# Patient Record
Sex: Female | Born: 1935 | Race: White | Hispanic: No | Smoking: Former smoker
Health system: Southern US, Community
[De-identification: ages and names within clinical notes are randomized; demographics above are authoritative.]

## PROBLEM LIST (undated history)

## (undated) DIAGNOSIS — J3089 Other allergic rhinitis: Secondary | ICD-10-CM

## (undated) DIAGNOSIS — G309 Alzheimer's disease, unspecified: Secondary | ICD-10-CM

## (undated) DIAGNOSIS — K5909 Other constipation: Secondary | ICD-10-CM

## (undated) DIAGNOSIS — R630 Anorexia: Secondary | ICD-10-CM

## (undated) DIAGNOSIS — M1990 Unspecified osteoarthritis, unspecified site: Secondary | ICD-10-CM

## (undated) DIAGNOSIS — F329 Major depressive disorder, single episode, unspecified: Secondary | ICD-10-CM

## (undated) DIAGNOSIS — F0280 Dementia in other diseases classified elsewhere without behavioral disturbance: Secondary | ICD-10-CM

## (undated) DIAGNOSIS — K219 Gastro-esophageal reflux disease without esophagitis: Secondary | ICD-10-CM

## (undated) DIAGNOSIS — R011 Cardiac murmur, unspecified: Secondary | ICD-10-CM

## (undated) DIAGNOSIS — I10 Essential (primary) hypertension: Secondary | ICD-10-CM

## (undated) DIAGNOSIS — M19019 Primary osteoarthritis, unspecified shoulder: Secondary | ICD-10-CM

## (undated) DIAGNOSIS — E785 Hyperlipidemia, unspecified: Secondary | ICD-10-CM

## (undated) DIAGNOSIS — F32A Depression, unspecified: Secondary | ICD-10-CM

## (undated) DIAGNOSIS — F0390 Unspecified dementia without behavioral disturbance: Secondary | ICD-10-CM

## (undated) HISTORY — DX: Hyperlipidemia, unspecified: E78.5

## (undated) HISTORY — DX: Other allergic rhinitis: J30.89

## (undated) HISTORY — DX: Anorexia: R63.0

## (undated) HISTORY — PX: TONSILLECTOMY: SUR1361

## (undated) HISTORY — DX: Primary osteoarthritis, unspecified shoulder: M19.019

## (undated) HISTORY — PX: ABDOMINAL HYSTERECTOMY: SHX81

## (undated) HISTORY — DX: Other constipation: K59.09

## (undated) HISTORY — PX: BACK SURGERY: SHX140

## (undated) HISTORY — DX: Dementia in other diseases classified elsewhere, unspecified severity, without behavioral disturbance, psychotic disturbance, mood disturbance, and anxiety: F02.80

## (undated) HISTORY — DX: Cardiac murmur, unspecified: R01.1

## (undated) HISTORY — DX: Gastro-esophageal reflux disease without esophagitis: K21.9

## (undated) HISTORY — DX: Unspecified dementia, unspecified severity, without behavioral disturbance, psychotic disturbance, mood disturbance, and anxiety: F03.90

## (undated) HISTORY — DX: Alzheimer's disease, unspecified: G30.9

---

## 2002-05-05 ENCOUNTER — Encounter: Payer: Self-pay | Admitting: Emergency Medicine

## 2002-05-05 ENCOUNTER — Emergency Department (HOSPITAL_COMMUNITY): Admission: EM | Admit: 2002-05-05 | Discharge: 2002-05-05 | Payer: Self-pay | Admitting: Emergency Medicine

## 2003-12-18 ENCOUNTER — Observation Stay (HOSPITAL_COMMUNITY): Admission: EM | Admit: 2003-12-18 | Discharge: 2003-12-19 | Payer: Self-pay | Admitting: Emergency Medicine

## 2004-11-20 ENCOUNTER — Other Ambulatory Visit: Admission: RE | Admit: 2004-11-20 | Discharge: 2004-11-20 | Payer: Self-pay | Admitting: Gynecology

## 2006-06-17 ENCOUNTER — Encounter: Admission: RE | Admit: 2006-06-17 | Discharge: 2006-06-17 | Payer: Self-pay | Admitting: Internal Medicine

## 2006-07-07 ENCOUNTER — Encounter: Admission: RE | Admit: 2006-07-07 | Discharge: 2006-07-07 | Payer: Self-pay | Admitting: Internal Medicine

## 2006-07-21 ENCOUNTER — Ambulatory Visit (HOSPITAL_COMMUNITY): Admission: RE | Admit: 2006-07-21 | Discharge: 2006-07-21 | Payer: Self-pay | Admitting: Internal Medicine

## 2006-09-05 ENCOUNTER — Inpatient Hospital Stay (HOSPITAL_COMMUNITY): Admission: RE | Admit: 2006-09-05 | Discharge: 2006-09-12 | Payer: Self-pay | Admitting: Neurosurgery

## 2006-09-08 ENCOUNTER — Ambulatory Visit: Payer: Self-pay | Admitting: Physical Medicine & Rehabilitation

## 2006-09-12 ENCOUNTER — Inpatient Hospital Stay (HOSPITAL_COMMUNITY)
Admission: RE | Admit: 2006-09-12 | Discharge: 2006-09-22 | Payer: Self-pay | Admitting: Physical Medicine & Rehabilitation

## 2006-09-12 ENCOUNTER — Ambulatory Visit: Payer: Self-pay | Admitting: Physical Medicine & Rehabilitation

## 2008-10-27 IMAGING — RF DG LUMBAR SPINE 2-3V
1 series · 2 of 2 positions shown · non-contrast
Comparison: MR dated and 07/05/2006.

CLINICAL DATA: L3-L5 fusion for disc herniation.

LUMBAR SPINE - SINGLE LATERAL C-ARM VIEW

[Series 1: run · 2 of 2 slices shown]
[im 1/2]
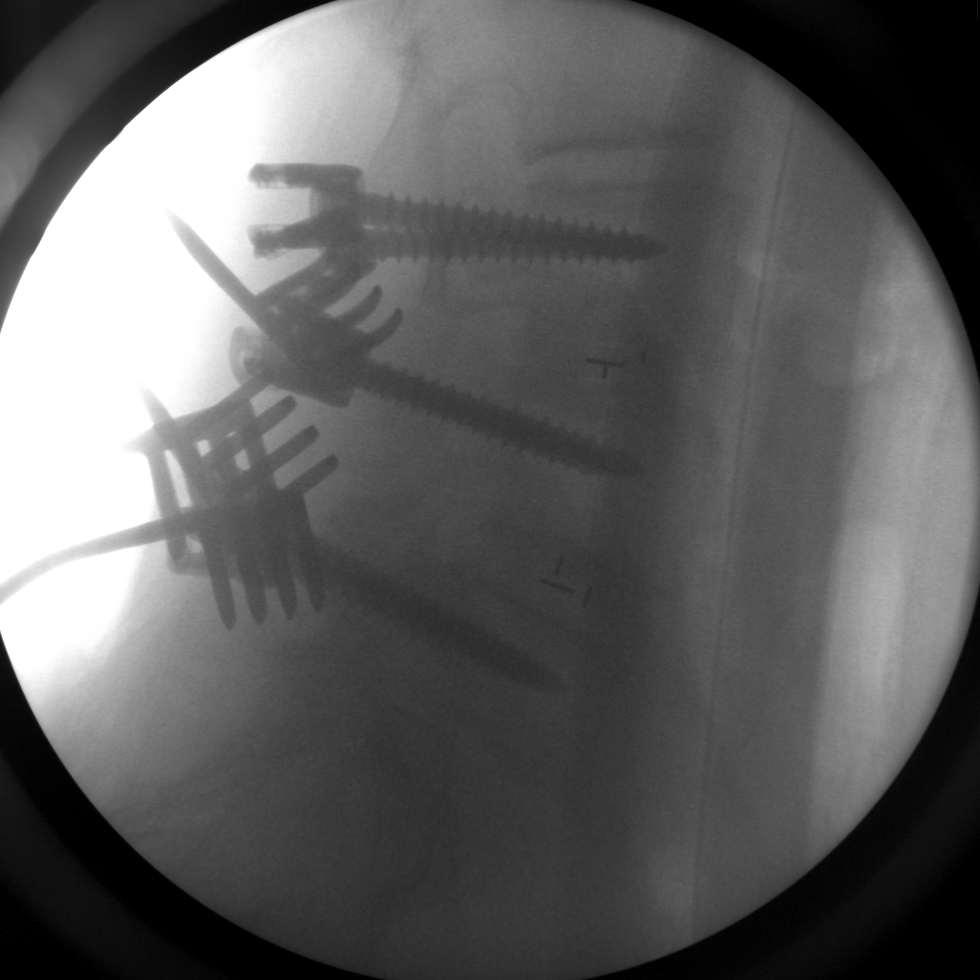
[im 2/2]
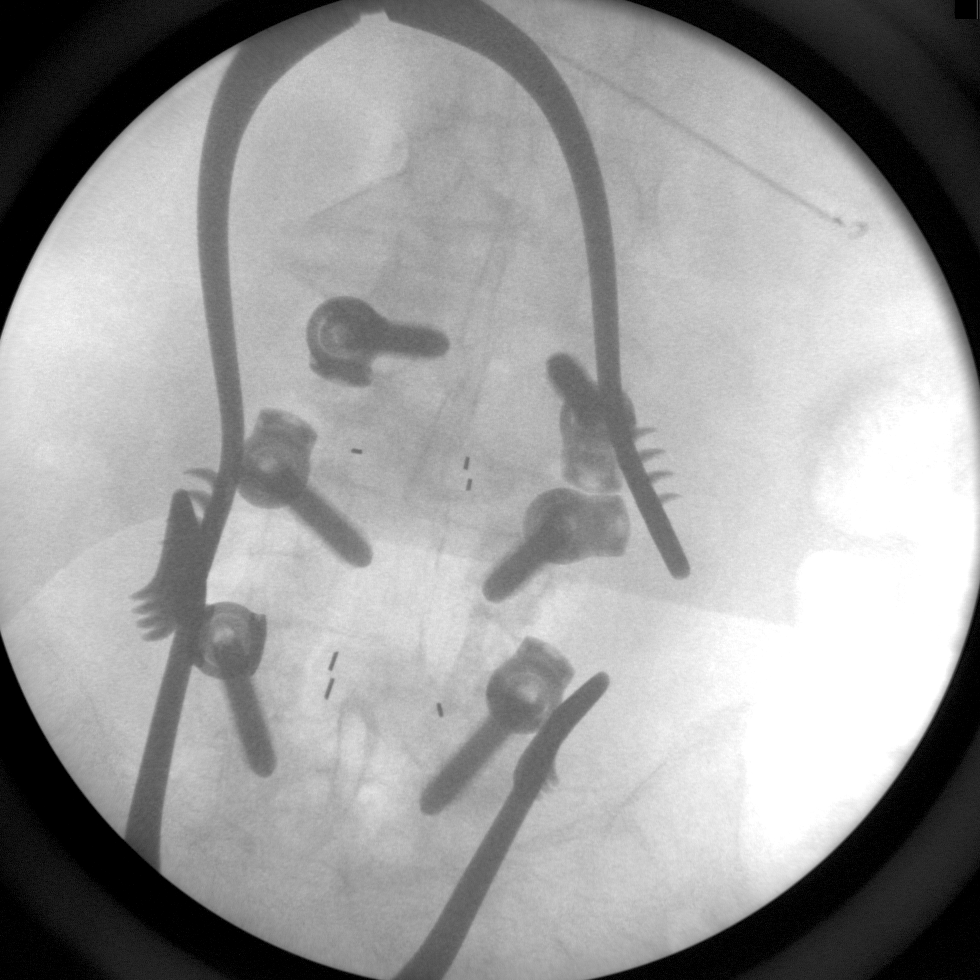

[2 of 2 positions shown; findings below may reference images not displayed]

FINDINGS: The last open disc space is again labeled the L5-S1 level. Interval
interbody bone plug and pedicle screw placement at the L3-L4 and L4-L5 levels.
The alignment cannot be adequately assessed due to poor visualization of the
vertebral bodies.

IMPRESSION

Operative changes, as described above.

## 2009-08-22 ENCOUNTER — Ambulatory Visit (HOSPITAL_COMMUNITY): Admission: RE | Admit: 2009-08-22 | Discharge: 2009-08-22 | Payer: Self-pay | Admitting: Gastroenterology

## 2009-12-18 ENCOUNTER — Ambulatory Visit (HOSPITAL_COMMUNITY): Admission: RE | Admit: 2009-12-18 | Discharge: 2009-12-18 | Payer: Self-pay | Admitting: Neurosurgery

## 2010-01-26 ENCOUNTER — Encounter
Admission: RE | Admit: 2010-01-26 | Discharge: 2010-01-26 | Payer: Self-pay | Source: Home / Self Care | Attending: Neurosurgery | Admitting: Neurosurgery

## 2010-03-08 ENCOUNTER — Encounter: Payer: Self-pay | Admitting: Neurosurgery

## 2010-03-17 ENCOUNTER — Encounter
Admission: RE | Admit: 2010-03-17 | Discharge: 2010-03-17 | Payer: Self-pay | Source: Home / Self Care | Attending: Neurosurgery | Admitting: Neurosurgery

## 2010-04-28 LAB — CREATININE, SERUM
Creatinine, Ser: 1.05 mg/dL (ref 0.4–1.2)
GFR calc Af Amer: 59 mL/min — ABNORMAL LOW (ref >60)
GFR calc non Af Amer: 49 mL/min — ABNORMAL LOW (ref >60)

## 2010-05-06 ENCOUNTER — Other Ambulatory Visit: Payer: Self-pay | Admitting: Neurosurgery

## 2010-05-06 DIAGNOSIS — M549 Dorsalgia, unspecified: Secondary | ICD-10-CM

## 2010-05-07 ENCOUNTER — Ambulatory Visit
Admission: RE | Admit: 2010-05-07 | Discharge: 2010-05-07 | Disposition: A | Discharge status: Home / Self Care | Payer: Medicare Other | Source: Ambulatory Visit | Attending: Neurosurgery | Admitting: Neurosurgery

## 2010-05-07 DIAGNOSIS — M549 Dorsalgia, unspecified: Secondary | ICD-10-CM

## 2010-05-12 ENCOUNTER — Inpatient Hospital Stay (HOSPITAL_COMMUNITY)
Admission: EM | Admit: 2010-05-12 | Discharge: 2010-05-18 | DRG: 690 | Disposition: A | Discharge status: Skilled Nursing Facility | Payer: Medicare Other | Attending: Internal Medicine | Admitting: Internal Medicine

## 2010-05-12 DIAGNOSIS — Z66 Do not resuscitate: Secondary | ICD-10-CM | POA: Diagnosis present

## 2010-05-12 DIAGNOSIS — I10 Essential (primary) hypertension: Secondary | ICD-10-CM | POA: Diagnosis present

## 2010-05-12 DIAGNOSIS — N390 Urinary tract infection, site not specified: Principal | ICD-10-CM | POA: Diagnosis present

## 2010-05-12 DIAGNOSIS — F0280 Dementia in other diseases classified elsewhere without behavioral disturbance: Secondary | ICD-10-CM | POA: Diagnosis present

## 2010-05-12 DIAGNOSIS — E876 Hypokalemia: Secondary | ICD-10-CM | POA: Diagnosis present

## 2010-05-12 DIAGNOSIS — R42 Dizziness and giddiness: Secondary | ICD-10-CM | POA: Diagnosis present

## 2010-05-12 DIAGNOSIS — K219 Gastro-esophageal reflux disease without esophagitis: Secondary | ICD-10-CM | POA: Diagnosis present

## 2010-05-12 DIAGNOSIS — R5381 Other malaise: Secondary | ICD-10-CM | POA: Diagnosis present

## 2010-05-12 DIAGNOSIS — G309 Alzheimer's disease, unspecified: Secondary | ICD-10-CM | POA: Diagnosis present

## 2010-05-12 DIAGNOSIS — R1115 Cyclical vomiting syndrome unrelated to migraine: Secondary | ICD-10-CM | POA: Diagnosis present

## 2010-05-12 LAB — URINALYSIS, ROUTINE W REFLEX MICROSCOPIC
Bilirubin Urine: NEGATIVE
Glucose, UA: NEGATIVE mg/dL
Hgb urine dipstick: NEGATIVE
Ketones, ur: 40 mg/dL — AB
Nitrite: NEGATIVE
Protein, ur: NEGATIVE mg/dL
Specific Gravity, Urine: 1.014 (ref 1.005–1.030)
Urobilinogen, UA: 0.2 mg/dL (ref 0.0–1.0)
pH: 8.5 — ABNORMAL HIGH (ref 5.0–8.0)

## 2010-05-12 LAB — DIFFERENTIAL
Basophils Absolute: 0.1 10*3/uL (ref 0.0–0.1)
Basophils Relative: 1 % (ref 0–1)
Eosinophils Absolute: 0.1 10*3/uL (ref 0.0–0.7)
Eosinophils Relative: 1 % (ref 0–5)
Lymphocytes Relative: 13 % (ref 12–46)
Lymphs Abs: 1.2 10*3/uL (ref 0.7–4.0)
Monocytes Absolute: 0.6 10*3/uL (ref 0.1–1.0)
Monocytes Relative: 6 % (ref 3–12)
Neutro Abs: 7.8 10*3/uL — ABNORMAL HIGH (ref 1.7–7.7)
Neutrophils Relative %: 80 % — ABNORMAL HIGH (ref 43–77)

## 2010-05-12 LAB — CBC
HCT: 42.3 % (ref 36.0–46.0)
Hemoglobin: 14.4 g/dL (ref 12.0–15.0)
MCH: 31.2 pg (ref 26.0–34.0)
MCHC: 34 g/dL (ref 30.0–36.0)
MCV: 91.8 fL (ref 78.0–100.0)
Platelets: 240 10*3/uL (ref 150–400)
RBC: 4.61 MIL/uL (ref 3.87–5.11)
RDW: 13.8 % (ref 11.5–15.5)
WBC: 9.7 10*3/uL (ref 4.0–10.5)

## 2010-05-12 LAB — POCT CARDIAC MARKERS
CKMB, poc: <1 ng/mL — ABNORMAL LOW (ref 1.0–8.0)
Myoglobin, poc: 71.3 ng/mL (ref 12–200)
Troponin i, poc: <0.05 ng/mL (ref 0.00–0.09)

## 2010-05-12 LAB — COMPREHENSIVE METABOLIC PANEL
ALT: 16 U/L (ref 0–35)
AST: 20 U/L (ref 0–37)
Albumin: 4.1 g/dL (ref 3.5–5.2)
Alkaline Phosphatase: 84 U/L (ref 39–117)
BUN: 24 mg/dL — ABNORMAL HIGH (ref 6–23)
CO2: 28 mEq/L (ref 19–32)
Calcium: 9.9 mg/dL (ref 8.4–10.5)
Chloride: 100 mEq/L (ref 96–112)
Creatinine, Ser: 0.95 mg/dL (ref 0.4–1.2)
GFR calc Af Amer: >60 mL/min (ref >60)
GFR calc non Af Amer: 55 mL/min — ABNORMAL LOW (ref >60)
Glucose, Bld: 135 mg/dL — ABNORMAL HIGH (ref 70–99)
Potassium: 4 mEq/L (ref 3.5–5.1)
Sodium: 139 mEq/L (ref 135–145)
Total Bilirubin: 1.5 mg/dL — ABNORMAL HIGH (ref 0.3–1.2)
Total Protein: 6.9 g/dL (ref 6.0–8.3)

## 2010-05-12 LAB — LIPASE, BLOOD: Lipase: 33 U/L (ref 11–59)

## 2010-05-13 LAB — CBC
HCT: 42.9 % (ref 36.0–46.0)
MCH: 30.3 pg (ref 26.0–34.0)
MCH: 30.8 pg (ref 26.0–34.0)
MCHC: 33.3 g/dL (ref 30.0–36.0)
MCV: 92.3 fL (ref 78.0–100.0)
MCV: 92.6 fL (ref 78.0–100.0)
Platelets: 263 10*3/uL (ref 150–400)
Platelets: 248 10*3/uL (ref 150–400)
RBC: 4.65 MIL/uL (ref 3.87–5.11)
RDW: 14.1 % (ref 11.5–15.5)

## 2010-05-13 LAB — BASIC METABOLIC PANEL
BUN: 17 mg/dL (ref 6–23)
CO2: 25 mEq/L (ref 19–32)
Chloride: 103 mEq/L (ref 96–112)
Creatinine, Ser: 0.92 mg/dL (ref 0.4–1.2)
Glucose, Bld: 151 mg/dL — ABNORMAL HIGH (ref 70–99)

## 2010-05-13 LAB — DIFFERENTIAL
Basophils Absolute: 0 10*3/uL (ref 0.0–0.1)
Eosinophils Absolute: 0 10*3/uL (ref 0.0–0.7)
Eosinophils Relative: 0 % (ref 0–5)
Lymphocytes Relative: 5 % — ABNORMAL LOW (ref 12–46)
Lymphs Abs: 0.7 10*3/uL (ref 0.7–4.0)
Monocytes Absolute: 1 10*3/uL (ref 0.1–1.0)
Monocytes Relative: 7 % (ref 3–12)
Neutro Abs: 12.1 10*3/uL — ABNORMAL HIGH (ref 1.7–7.7)

## 2010-05-13 LAB — URINE CULTURE

## 2010-05-13 NOTE — H&P (Signed)
NAMECLAUDINA, Angelica Marshall               ACCOUNT NO.:  [id number]  MEDICAL RECORD NO.:  [id number]           PATIENT TYPE:  E  LOCATION:  WLED                         FACILITY:  [facility]  PHYSICIAN:  Della Goo, M.D. DATE OF BIRTH:  1915-10-12  DATE OF ADMISSION:  05/12/2010 DATE OF DISCHARGE:                             HISTORY & PHYSICAL   DATE OF ADMISSION:  May 12, 2010.  PRIMARY CARE PHYSICIAN:  Georgianne Fick, M.D.  CHIEF COMPLAINT:  Nausea and vomiting.  HISTORY OF PRESENT ILLNESS:  This is a 77 year old female resident of the [facility], who presents to the emergency department with complaints of severe nausea and vomiting over the past 24 hours.  Patient denies having any diarrhea.  Denies having any constipation.  She also reports having dizziness.  Patient was seen in the emergency department and treated with medications; however, she had minimal relief from her symptoms and was referred for medical admission.  Patient denies having any fevers, chills.  PAST MEDICAL HISTORY:  Significant for: 1. Hypertension. 2. Gastroesophageal reflux disease. 3. Dementia.  ALLERGIES:  No known drug allergies.  SOCIAL HISTORY:  Patient is a nonsmoker, nondrinker.  No history of illicit drug usage.  FAMILY HISTORY:  Noncontributory secondary to age.  CODE STATUS:  Do not resuscitate.  PHYSICAL EXAMINATION FINDINGS:  GENERAL:  This is a 77 year old pleasant elderly thin Caucasian female, who is in no acute distress. VITAL SIGNS:  Temperature 97.6-98.1, blood pressure 172/85, heart rate 69, respirations 20, O2 sats 100%. HEENT EXAMINATION:  Normocephalic, atraumatic.  Pupils are equal, round and reactive to light.  Extraocular movements are intact.  Funduscopic benign.  There is no scleral icterus.  Nares are patent bilaterally. Oropharynx is clear. NECK:  Supple.  Full range of motion.  No thyromegaly, adenopathy or jugular venous  distention. CARDIOVASCULAR:  Regular rate and rhythm.  No murmurs, gallops or rubs appreciated. LUNGS:  Clear to auscultation bilaterally.  No rales, rhonchi or wheezes. ABDOMEN:  Positive bowel sounds, soft, nontender, nondistended.  No hepatosplenomegaly. EXTREMITIES:  Without cyanosis, clubbing or edema. NEUROLOGIC EXAMINATION:  Generalized weakness, but no focal deficits.  LABORATORY STUDIES:  White blood cell count 9.7, hemoglobin 14.4, hematocrit 42.3, platelets 240,000, neutrophils 80%, lymphocytes 13%. Sodium 139, potassium 4.0, chloride 100, carbon dioxide 28, BUN 24, creatinine 0.94, glucose 135, lipase 33.  Urinalysis negative.  Point of care cardiac markers with the myoglobin of 71.3, CK-MB less than 1.0 and troponin less than 0.05.  ASSESSMENT:  [age]-year-old female being admitted with: 1. Intractable nausea and vomiting. 2. Vertigo. 3. Gastroesophageal reflux disease. 4. Dementia.  PLAN:  Patient will be admitted for 23-hour observation.  She will be placed on clear liquids for now and her diet will be advanced as tolerated.  Antiemetic therapy has been ordered as needed and patient's regular medications will be held for now.  Her blood pressure is elevated so a p.r.n. hydralazine will be ordered for systolic blood pressures greater than 160.  Patient will be placed on DVT prophylaxis at this time and further workup will ensue pending results of the patient's clinical course.  CODE STATUS:  Will continue as a do not resuscitate.     Della Goo, M.D.     HJ/MEDQ  D:  05/13/2010  T:  05/13/2010  Job:  [id number]  cc:   Georgianne Fick, M.D. Fax: [phone]  Electronically Signed by Della Goo M.D. on 05/13/2010 03:27:23 AM

## 2010-05-14 LAB — CBC
HCT: 41.7 % (ref 36.0–46.0)
MCV: 93.3 fL (ref 78.0–100.0)
RBC: 4.47 MIL/uL (ref 3.87–5.11)
WBC: 9.9 10*3/uL (ref 4.0–10.5)

## 2010-05-15 LAB — URINALYSIS, ROUTINE W REFLEX MICROSCOPIC
Glucose, UA: NEGATIVE mg/dL
Hgb urine dipstick: NEGATIVE
Protein, ur: NEGATIVE mg/dL
pH: 6.5 (ref 5.0–8.0)

## 2010-05-15 LAB — BASIC METABOLIC PANEL
BUN: 20 mg/dL (ref 6–23)
CO2: 28 mEq/L (ref 19–32)
Chloride: 109 mEq/L (ref 96–112)
Creatinine, Ser: 0.77 mg/dL (ref 0.4–1.2)
GFR calc Af Amer: >60 mL/min (ref >60)
Potassium: 3.6 mEq/L (ref 3.5–5.1)

## 2010-05-15 LAB — CBC
Hemoglobin: 15 g/dL (ref 12.0–15.0)
MCH: 31.1 pg (ref 26.0–34.0)
MCV: 93.6 fL (ref 78.0–100.0)
RBC: 4.82 MIL/uL (ref 3.87–5.11)
WBC: 11.6 10*3/uL — ABNORMAL HIGH (ref 4.0–10.5)

## 2010-05-15 LAB — URINE MICROSCOPIC-ADD ON

## 2010-05-16 LAB — DIFFERENTIAL
Basophils Absolute: 0.1 10*3/uL (ref 0.0–0.1)
Basophils Relative: 1 % (ref 0–1)
Eosinophils Absolute: 0.4 10*3/uL (ref 0.0–0.7)
Lymphocytes Relative: 14 % (ref 12–46)
Monocytes Absolute: 1 10*3/uL (ref 0.1–1.0)
Neutrophils Relative %: 72 % (ref 43–77)
Smear Review: ADEQUATE

## 2010-05-16 LAB — CBC
MCV: 93 fL (ref 78.0–100.0)
RBC: 4.41 MIL/uL (ref 3.87–5.11)
RDW: 14.5 % (ref 11.5–15.5)
WBC: 11.2 10*3/uL — ABNORMAL HIGH (ref 4.0–10.5)

## 2010-05-18 LAB — URINE CULTURE
Colony Count: 60000
Culture  Setup Time: 201203310529
Special Requests: NEGATIVE

## 2010-05-18 LAB — CBC
HCT: 36.4 % (ref 36.0–46.0)
MCH: 30.2 pg (ref 26.0–34.0)
MCV: 93.8 fL (ref 78.0–100.0)
Platelets: 189 10*3/uL (ref 150–400)
RDW: 14.7 % (ref 11.5–15.5)
WBC: 8.8 10*3/uL (ref 4.0–10.5)

## 2010-06-30 NOTE — Op Note (Signed)
NAMEDENINA, RIEGER               ACCOUNT NO.:  [id number]   MEDICAL RECORD NO.:  [id number]          PATIENT TYPE:  INP   LOCATION:  3108                         FACILITY:  [facility]   PHYSICIAN:  Cristi Loron, M.D.DATE OF BIRTH:  08-04-1915   DATE OF PROCEDURE:  09/05/2006  DATE OF DISCHARGE:                               OPERATIVE REPORT   BRIEF HISTORY:  The patient is a 77 year old white female who has  suffered from back and leg pain consistent with neurogenic claudication.  She failed medical management and was worked up with a lumbar MRI, which  demonstrated the patient had lumbar scoliosis, degenerative disk  disease, spondylolisthesis, stenosis, etc.  I discussed the various  treatment with the patient including surgery.  The patient has weighed  the risks, benefits and alternatives to surgery and has decided to  proceed with a decompressive laminectomy at L3-4 and L4-5, as well as an  L3-4 and 4-5 fusion, instrumentation, etc.   PREOPERATIVE DIAGNOSIS:  L4-5 and L5-S1 scoliosis, degenerative disk  disease, spondylolisthesis, stenosis, lumbar radiculopathy and lumbago.   POSTOPERATIVE DIAGNOSIS:  L4-5 and L5-S1 scoliosis, degenerative disk  disease, spondylolisthesis, stenosis, lumbar radiculopathy and lumbago.   PROCEDURE:  L4 laminectomy with bilateral foraminotomies to treat the  spinal stenosis, severe spinal stenosis and spondylolisthesis at L4-5  and decompression of the bilateral L5 nerve roots; bilateral  laminotomies, L3, to treat the spinal stenosis at L3-4 and perform  foraminotomy at bilateral L4 nerve root; L3-4 and L4-5 transforaminal  lumbar interbody fusion with local morselized autograft bone and Vitoss  bone graft extender; insertion of L3-4 and L4-5 interbody prosthesis  (Capstone PEEK interbody prosthesis); posterior segmental fixation, L3  to L5, with Legacy titanium pedicle screws and rods; L3-4 and L4-5  posterolateral arthrodesis with local  morselized autograft bone and  Vitoss bone graft extender.   SURGEON:  Delma Officer, MD   ASSISTANT:  Shirlean Kelly, MD   ANESTHESIA:  General endotracheal.   ESTIMATED BLOOD LOSS:  400 mL.   SPECIMENS:  None.   DRAINS:  None.   COMPLICATIONS:  None.   DESCRIPTION OF PROCEDURE:  The patient was brought to the operating room  by the anesthesia team.  General endotracheal anesthesia was induced.  The patient was then carefully turned to the prone position on the  Wilson frame.  Her lumbosacral region was then prepared with Betadine  scrub and Betadine solution.  Sterile drapes were applied.  I then  injected the area to be incised with Marcaine with epinephrine solution  and used a scalpel to make a linear midline incision over the L3-4 and  L4-5 interspaces.  I used electrocautery to perform a bilateral  subperiosteal dissection, exposing the spinous processes and lamina of  L2, L3, L4 and L5.  We then obtained intraoperative radiograph to  confirm our location.   We then inserted the cerebellar retractor for exposure.  We began by  using the scalpel to incise the interspinous ligament at L4-5 and L3-4.  We then used a Leksell rongeur to remove the spinous process of L4 and  part of  the L4 lamina.  We saved this bone and later cleared it of soft  tissue, morselized it and used it in the fusion process as local  autograft bone.  We then used a high-speed drill to perform bilateral L4  and L3 laminotomies.  We completed the L4 laminectomy with the Kerrison  punch and removed the L4-5 ligamentum flavum.  We then performed  foraminotomies about the bilateral L5 nerve root.  There was severe  stenosis at this level.   We then used a Kerrison punch to widen the laminotomies at L3 and remove  the L3-4 ligament flavum and performed foraminotomies about the  bilateral L3 and L4 nerve roots.  The decompression that we did at L3-4  and L4-5 was in excess of what would be required to  do a transforaminal  lumbar interbody fusion secondary to the patient's severe stenosis and  spondylolisthesis.   Having completed the decompression, we now turned our attention to the  arthrodesis.  I removed the inferior facet of L3 on left and L4 the  right; this gave [place] lateral exposure to the intervertebral disk space.  We then incised the intervertebral disk at L3-4 and L4-5 with a 15 blade  scalpel and performed a near-total intervertebral diskectomy using the  pituitary forceps and Epstein and Scoville curettes, preparing the  intervertebral endplates for the fusion.  We then used the trial spacers  and determined to use an 8 x.26-mm Capstone PEEK interbody spacers at L3-  4 and a 10 x 26 at L4-5.  We prefilled these spacers with local  autograft bone and Vitoss and then placed the prosthesis into the  interspace, of course after retracting the neural structures out of  harm's way.  There was a good snug fit of the prosthesis at both levels.  We filled the remainder of the intervertebral space with local autograft  bone and Vitoss, completing the transforaminal lumbar interbody fusion.   We now turned our attention to the posterior instrumentation.  Under  fluoroscopic guidance, we cannulated bilateral L3, 4 and 5 pedicles with  a bone probe.  We tapped the pedicles with a 5.5-mm tap, then removed  the tap and probed the tapped pedicles to raw cortical breeches.  We  then inserted 6.5 x 50-mm pedicle screws bilaterally at L3, L4 and L5.  We then palpated along the medial aspect of the L3, L4 and L5 pedicles  and noted that there were no cortical breeches and that the L3, 4 and 5  nerve roots were not injured.  We then connected the unilateral pedicle  screws with a lordotic rod, which we tightened in place with the caps.  We then placed a cross-connector between the 2 rods and we tightened  this appropriately; this completed the instrumentation.   We now turned out attention to  the posterolateral arthrodesis.  We used  a high-speed drill to decorticate the remainder of the L3-4 and 4-5  facet and pars region, as well as transverse processes.  We then laid a  combination of local morselized autograft bone and Vitoss bone graft  extender over these 2 decorticated posterolateral structures, completing  the posterolateral arthrodesis.   We then obtained hemostasis using bipolar electrocautery.  We irrigated  the wound out with bacitracin solution.  We then inspected the thecal  sac and the bilateral L3, 4 and 5 nerve roots and noted the neural  structure were well decompressed.  We then removed the cerebellar  retractors and then  reapproximated the patient's thoracolumbar fascia  with interrupted #1 Vicryl suture, subcutaneous tissue with interrupted  2-0 Vicryl suture and the skin with Steri-Strips and Benzoin.  The wound  was then coated with bacitracin ointment, a sterile dressing was  applied, the  drapes were removed and the patient was subsequently returned to the  supine position, where she was extubated by the anesthesia team and  transported to the post anesthesia care unit in stable condition.  All  sponge, instrument and needle counts were correct at the end of this  case.      Cristi Loron, M.D.  Electronically Signed     JDJ/MEDQ  D:  09/05/2006  T:  09/06/2006  Job:  [id number]

## 2010-07-03 NOTE — Discharge Summary (Signed)
NAMETASMIA, BLUMER               ACCOUNT NO.:  [id number]   MEDICAL RECORD NO.:  [id number]          PATIENT TYPE:  IPS   LOCATION:  4006                         FACILITY:  [facility]   PHYSICIAN:  Ellwood Dense, M.D.   DATE OF BIRTH:  [birth year]   DATE OF ADMISSION:  09/12/2006  DATE OF DISCHARGE:  09/22/2006                               DISCHARGE SUMMARY   DISCHARGE DIAGNOSES:  1. Radiculopathy with neurogenic claudication bilateral lower      extremities, secondary to L4-5, L5-S1 scoliosis with      spondylolisthesis, status post laminotomy L3 to L5 foraminotomy and      decompression.  2. Urinary tract infection.  3. Urinary retention, resolved.  4. Hypertension.   HISTORY OF PRESENT ILLNESS:  [name] is an 77 year old female with  a history of hypertension with significant low back pain secondary to  radiculopathy, bilateral lower extremity neurogenic claudication  secondary to L4-5, L5-S1 scoliosis, spondylolisthesis, and  radiculopathy.  She underwent L4 laminotomy with L3-5 foraminotomy with  decompression by Dr. Lovell Sheehan  on 09/05/2006.  Postop, has had problems  with urinary retention requiring Foley replacement.  She was started on  Cipro for UTI on 09/09/2006 and Foley was discontinued on 09/12/2006.  Currently, patient continues to have issues with constipation and  continues to be limited by nausea and dizziness and requires mod-assist  ambulation.  We have controls for further therapy.   PAST MEDICAL HISTORY:  1. Significant for hysterectomy.  2. Macular degeneration in right eye.  3. Hypertension.  4. Excision of cataracts with intraocular implants.   ALLERGIES:  NO KNOWN DRUG ALLERGIES.   FAMILY HISTORY:  Noncontributory.   SOCIAL HISTORY:  The patient is widowed and lives with daughter in one-  level home with no steps for entry,  and she does not use any tobacco or  alcohol.   HOSPITAL COURSE:  [name] was admitted to rehab on 09/12/2006  when patient's therapy is to consist of PT/OT daily.  Past admission,  voiding was monitored with bladder scan to check for __________  secondary to continued problem with voiding, she was started on Flomax  and Urecholine initially.  She was treated with a 7-day course of Cipro  for UTI.  Labs past admission reveal hemoglobin 12.3, hematocrit 32.5,  white count 10.3, platelets 405.  Check of lytes revealed sodium 133,  potassium 3.0, chloride 99, CO2 of 28, BUN 23, creatinine 0.76, glucose  125.  Patient's hypokalemia was supplemented.  Recheck lytes on  09/15/2006, revealed sodium 135, potassium 3.7, chloride 101, CO2 of 29,  BUN 22, creatinine 0.69, glucose 111.  The patient's pain control is  managed with Celebrex and p.r.n. use of Ultram  As patient's mobility  improved, she was noted to be voiding more efficiently and Flomax and  Urecholine were discontinued by time of discharge.  Patient's back  incision was monitored along and was noted to be healing well without  any signs of instance of infection.  Blood pressures were monitored on a  q.i.d. basis and were noted to be under good control ranging  from 100-  120s systolic, 60-70 diastolic.  Overall, patient's pain control and  mobility was greatly improved by the time of discharge.  OT has worked  with patient regarding  self care needs as well as increasing upper  extremity strength and endurance  for ADLs.  Patient was supervision  level for shower transfers, otherwise modified independent with  supervision for self care.  Patient was able to navigate 160 feet x3  with supervision.  Supervision for flight of stairs.  Supervision and  verbal cues for bed mobility.  She continues to have issues with doning  and doffing the corset and family is to assist with this post discharge  .  Patient will continue with further followup home health, PT and OT  past discharge on 09/22/2006.  Patient is discharged to home.   DISCHARGE  MEDICATIONS:  1. HCTZ 25 mg a day.  2. Xanax 0.5 mg q.h.s.  3. Ocuvite 1 per day.  4. Senokot S 2 p.o. q.h.s.  5. Celebrex 100 mg daily.  6. Ultram 50 mg q.6-8 hours p.r.n. pain.  7. Optivar eye drops b.i.d.  8. Tylenol as needed for pain.   DIET:  Regular.   ACTIVITY:  As tolerated.  Routine back precautions.  No alcohol, no  smoking, no driving.  Intermittent supervision; use walker for  ambulation.   SPECIAL INSTRUCTIONS:  Wear corset when out of bed.  No bending,  twisting or lifting anything over 5 pounds.  Do not use diclofenac.  Keep back incision clean and dry.   FOLLOWUP:  Patient to follow up with Dr. Nicholos Johns, M.D. and Dr.  Lovell Sheehan  in 2-3 weeks.  Follow up with Dr. Lamar Benes as needed.      Greg Cutter, P.A.    ______________________________  Ellwood Dense, M.D.    PP/MEDQ  D:  09/26/2006  T:  09/26/2006  Job:  [id number]

## 2010-07-03 NOTE — Discharge Summary (Signed)
Angelica Marshall, Angelica Marshall               ACCOUNT NO.:  [id number]   MEDICAL RECORD NO.:  [id number]          PATIENT TYPE:  INP   LOCATION:  0467                         FACILITY[name]   PHYSICIAN:  Mallory Shirk, MD     DATE OF BIRTH:  10/26/1915   DATE OF ADMISSION:  12/18/2003  DATE OF DISCHARGE:  12/19/2003                                 DISCHARGE SUMMARY   DISCHARGE DIAGNOSES:  1.  Nausea, vomiting, and diarrhea.  2.  Weakness.   DISCHARGE MEDICATIONS:  1.  Xanax 0.5 mg p.o. q.h.s.  2.  HCTZ 25 mg p.o. q.d.   FOLLOW-UP APPOINTMENTS:  With Dr. Nicholos Johns on December 27, 2003 at 11:00  a.m.  Patient at that time will review all medications with Dr.  Nicholos Johns.   HISTORY OF PRESENT ILLNESS:  Angelica Marshall is a very pleasant 77 year old  Caucasian woman who presented to the emergency department on December 18, 2003 with a three-day history of diarrhea and a less-than 24-hour history of  nausea and one episode of vomiting.  No fevers or chills during the past few  days but had a dry cough.  She did not have any malaise until the a.m. of  presentation.  She had presyncopal symptoms in the office of her PCP, Dr.  Nicholos Johns, which prompted an evaluation in the ED.  The patient states  that the diarrhea was mostly just loose stools, not totally watery.  She  also stated that she had some decreased p.o. appetite in the last three days  prior to admission.  No bloody stool or blood in the vomit.  She has not had  a flu vaccine yet this year.  No headaches or sinus symptoms.  She says that  she has had no sick contacts, no recent travel history, or any new food  ingestions.   PAST MEDICAL HISTORY:  1.  HTN.  2.  Diverticulitis.  3.  Hyperlipidemia.   MEDICATIONS ON ADMISSION:  1.  Xanax 0.5 mg p.o. q.h.s.  2.  HCTZ 25 mg p.o. q.d.   ALLERGIES:  No known drug allergies.   ADMISSION PHYSICAL EXAMINATION:  VITAL SIGNS:  Blood pressure 128/73, pulse  71, respirations 18,  temperature 99.1.  GENERAL:  Patient lying in bed in no acute distress.  HEENT:  Normocephalic and atraumatic.  PERRLA.  Mucous membranes dry.  Oropharynx slightly erythematous with no exudate.  Patient is wearing  dentures.  LUNGS:  Clear to auscultation bilaterally.  No wheezes.  No rales.  HEART:  Regular rate and rhythm.  No murmurs, rubs or gallops.  ABDOMEN:  Soft.  Positive bowel sounds.  No tenderness.  No masses.  EXTREMITIES:  Positive varicose veins.  No clubbing, cyanosis or edema.  NEUROLOGIC:  Nonfocal.   Chest x-ray showed no acute infiltrates or cardiomegaly.   Abdominal CT negative for any inflammatory changes or obstruction.   LABS ON ADMISSION:  WBC 5.1, hemoglobin 14.4, MCV 92, platelets 256.  Potassium 3.5.  Other electrolytes normal.  Urinalysis negative.   HOSPITAL COURSE:  1.  Nausea and vomiting, likely secondary to  viral gastroenteritis:  Patient      was kept n.p.o.  She had no episodes of nausea or vomiting during this      hospital stay.  On hospital day #2, the patient was able to tolerate      p.o. without any nausea or vomiting.  Her diarrhea had also resolved.      She was able to ambulate independently without assistance.  The patient      now states that she is back to baseline and ready to go home.   1.  Hypertension:  Her medications were held since her blood pressure was      well controlled on admission.  She has been advised to resume it when      she goes home.  She is on HCTZ 25 mg p.o. q.d.   1.  Fatigue:  Patient says she feels better this morning.  She is not sure      if the fatigue was secondary to decreased po intake prior to admission.      She had no orthostatic changes.   Patient was discharged in stable condition.  She will be going home with her  daughter-in-law.  On the day of discharge, her blood pressure was 104/64,  pulse 70, respiration 18, temperature 98.1.  Electrolytes normal.  Patient  was advised to follow up with Dr.  Nicholos Johns as scheduled.  She was also  advised to review all medications with him.   Patient is advised to return to the emergency room immediately upon onset of  nausea, vomiting, dizziness, abdominal pain, or any other symptoms that may  need immediate medical attention.      GDK/MEDQ  D:  12/19/2003  T:  12/19/2003  Job:  [id number]

## 2010-07-03 NOTE — H&P (Signed)
Angelica Marshall, Angelica Marshall               ACCOUNT NO.:  [id number]   MEDICAL RECORD NO.:  [id number]          PATIENT TYPE:  EMS   LOCATION:  ED                           FACILITY:  [facility]   PHYSICIAN:  Gertha Calkin, M.D.DATE OF BIRTH:  [birth year]   DATE OF ADMISSION:  12/18/2003  DATE OF DISCHARGE:                                HISTORY & PHYSICAL   This is a 23-hour observation admission.   CHIEF COMPLAINT:  Nausea and vomiting for less than 24 hours, diarrhea for  approximately three days.   HISTORY OF PRESENT ILLNESS:  Patient is a pleasant 77 year old female who  presents with an approximately three-day history of diarrhea, loose stools.  She had some emesis this morning, the first time.  She states no fevers or  chills during this last few days but has had a dry cough.  She denies  significant malaise until this morning.  Had  presyncopal symptoms in the  office of Dr. Nicholos Johns, which prompted an evaluation here in the ED.  She states that the diarrhea was just mostly loose stool, not truly watery.  There was no significant volume loss.  She does state that she has had  decreased p.o. appetite the last three days.  Denies any blood in the  diarrhea or in her vomit.  She states that she has not had a flu vaccine yet  this year.  No headaches or sinus symptoms.  A slight sore throat.  She  states that she has had no sick contacts, no recent travel history, and no  exotic food ingestions.   PAST MEDICAL HISTORY:  1.  Hypertension.  2.  Diverticulitis.  3.  Hyperlipidemia.   MEDICATIONS:  1.  Xanax 0.5 mg p.o. q.h.s.  2.  A blood pressure pill, unknown at this point.  3.  She also takes fish tablets, calcium, and a Centrum Silver vitamin each      day.   REVIEW OF SYSTEMS:  Positive for fatigue and presyncopal as well as cough,  otherwise review of systems is negative.   PHYSICAL EXAMINATION:  VITAL SIGNS:  Temperature 99.1, pulse 71,  respirations 18, blood pressure  128/73.  GENERAL:  This is a pleasant female lying in bed in no acute distress.  HEENT:  No JVD.  Dry mucous membranes.  Pupils are equal, round and reactive  to light.  Oropharynx was slightly erythematous with no exudates noted.  She  does have dentures, upper and bottom set.  LUNGS:  Clear to auscultation bilaterally with good air movement.  HEART:  Regular rate and rhythm.  No murmurs, rubs or gallops.  ABDOMEN:  Soft, nontender, nondistended.  Positive bowel sounds.  No  organomegaly appreciated.  No CVA tenderness.  EXTREMITIES:  Positive varicose veins, otherwise no clubbing, cyanosis or  edema present.  NEUROLOGIC:  She has good mentation and is alert and oriented x 3.  Cranial  nerves without deficits.  Strength is decreased at 4/5 and symmetric.  SKIN:  Notable just for the varicose veins, otherwise no lesions or rashes  noted.   Chest x-ray showed no acute  infiltrates or cardiomegaly.   Abdominal CT was also negative for any inflammatory changes or obstruction.   White count of 5.1, hemoglobin 14.4, MCV 92, platelets 256.  Her CMP was  normal except for a potassium of 3.5.  Lipase was also normal.  Coags were  normal.  UA was ordered but pending at this time.   ASSESSMENT/PLAN:  1.  Gastroenteritis:  Most likely viral in nature.  Will give her supportive      care with IV hydration as well as p.r.n. medications, including Imodium      and Phenergan as needed.  2.  Hypertension:  Will hold her medications tonight and resume as needed      since she has had decreased po intake.  3.  Fatigue:  Will hopefully see her have an improved diet as her symptoms      resolve with supportive care.  We may need to initiate PT therapy at      home if the decision is to send home within 23 hours.  Otherwise, we may      initiate rehab in patient for any further deterioration.      JD/MEDQ  D:  12/18/2003  T:  12/18/2003  Job:  [id number]   cc:   Georgianne Fick, M.D.  [address]  [place]  [place] [address]  Fax: [phone]

## 2010-07-03 NOTE — Discharge Summary (Signed)
Angelica Marshall, Angelica Marshall               ACCOUNT NO.:  [id number]   MEDICAL RECORD NO.:  [id number]          PATIENT TYPE:  INP   LOCATION:  3031                         FACILITY:  [facility]   PHYSICIAN:  Cristi Loron, M.D.DATE OF BIRTH:  Jun 30, 1915   DATE OF ADMISSION:  09/05/2006  DATE OF DISCHARGE:  09/12/2006                               DISCHARGE SUMMARY   BRIEF HISTORY:  The patient is an 77 year old white female who suffered  from back and leg pain consistent with neurogenic claudication.  She has  failed medical management and was worked up with a lumbar MRI which  demonstrated the patient has lumbar scoliosis, degenerative disease,  spondylolisthesis, stenosis, etcetera.  I discussed the various  treatment options with the patient including surgery.  The patient has  weighed the risks, benefits, and alternatives to surgery, and has  decided to proceed with a decompressive laminectomy at L3-4 and L4-5, as  well as L3-4 and L4-5 fusion, instrumentation, etcetera.   For further details of this admission, please refer to the entire  history and physical.   HOSPITAL COURSE:  Admitted the patient to [facility] on September 05, 2006.  On day of admission, I performed a L3-4 and L4-5  decompression, instrumentation, and fusion.  The surgery went well (for  full details of this operation, please refer to the operative note).   POSTOPERATIVE COURSE:  The postoperative course was as follows:  She did  develop urinary retention which resolved.  We had PT and OT see the  patient, as well as PM&R.  The patient by September 12, 2006 was afebrile,  and she was felt to be stable for discharge to the rehab unit and was  transferred to rehab on September 12, 2006.   FINAL DIAGNOSIS:  L4-5, L5-S1 scoliosis, degenerative disease,  spondylolisthesis, stenosis, lumbar radiculopathy lumbago.   PROCEDURE PERFORMED:  L4 laminectomy with bilateral foraminotomies for  spinal stenosis at L4-5, L3  laminectomy, L3 spondylolisthesis, L3-4 and  L4-5 transforaminal lumbar interbody fusion with local morselized  autograft bone and Vitoss bone graft extender; the surface of L3-4 and  L4-5 interbody prostheses (Capstone PEEK interbody prostheses);  posterior segmental fixation L3-L5 with Legacy titanium pedicle screws  and rods;  L3-4 and L4-5 posterior lateral arthrodesis with local  morselized autograft bone andVitoss bonegraft extender.   DISCHARGE INSTRUCTIONS:  The patient was given written discharge  instructions.  Instructed to follow up with me in 4 weeks time.      Cristi Loron, M.D.  Electronically Signed     JDJ/MEDQ  D:  10/13/2006  T:  10/13/2006  Job:  [id number]

## 2010-11-30 LAB — BASIC METABOLIC PANEL
BUN: 22
BUN: 24 — ABNORMAL HIGH
Calcium: 7.9 — ABNORMAL LOW
Chloride: 106
Creatinine, Ser: 0.69
Creatinine, Ser: 1.05
GFR calc Af Amer: >60
GFR calc non Af Amer: 49 — ABNORMAL LOW
GFR calc non Af Amer: >60
GFR calc non Af Amer: >60
Glucose, Bld: 95
Potassium: 3.6
Potassium: 4.3
Sodium: 139

## 2010-11-30 LAB — CBC
HCT: 32.3 — ABNORMAL LOW
HCT: 36.5
HCT: 42.5
Hemoglobin: 10.9 — ABNORMAL LOW
MCV: 92.8
MCV: 93
Platelets: 304
Platelets: 405 — ABNORMAL HIGH
RBC: 3.5 — ABNORMAL LOW
RBC: 3.92
RDW: 14
WBC: 10.3
WBC: 8.6
WBC: 9.7

## 2010-11-30 LAB — DIFFERENTIAL
Basophils Absolute: 0
Basophils Relative: 1
Eosinophils Absolute: 0.3
Eosinophils Relative: 3
Monocytes Absolute: 1.1 — ABNORMAL HIGH

## 2010-11-30 LAB — TYPE AND SCREEN: ABO/RH(D): A POS

## 2010-11-30 LAB — COMPREHENSIVE METABOLIC PANEL
AST: 20
Albumin: 2.5 — ABNORMAL LOW
Alkaline Phosphatase: 80
CO2: 28
Chloride: 99
GFR calc Af Amer: >60
GFR calc non Af Amer: >60
Potassium: 3 — ABNORMAL LOW
Total Bilirubin: 0.8

## 2010-11-30 LAB — URINALYSIS, ROUTINE W REFLEX MICROSCOPIC
Glucose, UA: NEGATIVE
Ketones, ur: 40 — AB
Nitrite: POSITIVE — AB
Protein, ur: 30 — AB
Urobilinogen, UA: 0.2

## 2010-11-30 LAB — POCT I-STAT 7, (LYTES, BLD GAS, ICA,H+H)
Acid-Base Excess: 3 — ABNORMAL HIGH
O2 Saturation: 100
Patient temperature: 35.8
TCO2: 29
pCO2 arterial: 41.9
pO2, Arterial: 481 — ABNORMAL HIGH

## 2010-11-30 LAB — URINE CULTURE: Colony Count: >=100000

## 2011-01-08 ENCOUNTER — Emergency Department (HOSPITAL_COMMUNITY): Payer: Medicare Other

## 2011-01-08 ENCOUNTER — Other Ambulatory Visit: Payer: Self-pay

## 2011-01-08 ENCOUNTER — Inpatient Hospital Stay (HOSPITAL_COMMUNITY)
Admission: EM | Admit: 2011-01-08 | Discharge: 2011-01-12 | DRG: 641 | Disposition: A | Discharge status: Skilled Nursing Facility | Payer: Medicare Other | Attending: Internal Medicine | Admitting: Internal Medicine

## 2011-01-08 DIAGNOSIS — R531 Weakness: Secondary | ICD-10-CM

## 2011-01-08 DIAGNOSIS — E860 Dehydration: Principal | ICD-10-CM | POA: Diagnosis present

## 2011-01-08 DIAGNOSIS — M129 Arthropathy, unspecified: Secondary | ICD-10-CM | POA: Diagnosis present

## 2011-01-08 DIAGNOSIS — F411 Generalized anxiety disorder: Secondary | ICD-10-CM | POA: Diagnosis present

## 2011-01-08 DIAGNOSIS — I10 Essential (primary) hypertension: Secondary | ICD-10-CM | POA: Diagnosis present

## 2011-01-08 DIAGNOSIS — K529 Noninfective gastroenteritis and colitis, unspecified: Secondary | ICD-10-CM | POA: Diagnosis present

## 2011-01-08 DIAGNOSIS — F29 Unspecified psychosis not due to a substance or known physiological condition: Secondary | ICD-10-CM | POA: Diagnosis not present

## 2011-01-08 DIAGNOSIS — E876 Hypokalemia: Secondary | ICD-10-CM | POA: Diagnosis present

## 2011-01-08 DIAGNOSIS — E785 Hyperlipidemia, unspecified: Secondary | ICD-10-CM | POA: Diagnosis present

## 2011-01-08 DIAGNOSIS — R1110 Vomiting, unspecified: Secondary | ICD-10-CM | POA: Diagnosis present

## 2011-01-08 DIAGNOSIS — F329 Major depressive disorder, single episode, unspecified: Secondary | ICD-10-CM | POA: Diagnosis present

## 2011-01-08 DIAGNOSIS — Z79899 Other long term (current) drug therapy: Secondary | ICD-10-CM

## 2011-01-08 DIAGNOSIS — K5900 Constipation, unspecified: Secondary | ICD-10-CM | POA: Diagnosis present

## 2011-01-08 DIAGNOSIS — Z66 Do not resuscitate: Secondary | ICD-10-CM | POA: Diagnosis present

## 2011-01-08 DIAGNOSIS — F3289 Other specified depressive episodes: Secondary | ICD-10-CM | POA: Diagnosis present

## 2011-01-08 DIAGNOSIS — K5289 Other specified noninfective gastroenteritis and colitis: Secondary | ICD-10-CM | POA: Diagnosis present

## 2011-01-08 DIAGNOSIS — D72829 Elevated white blood cell count, unspecified: Secondary | ICD-10-CM | POA: Diagnosis not present

## 2011-01-08 DIAGNOSIS — R1115 Cyclical vomiting syndrome unrelated to migraine: Secondary | ICD-10-CM

## 2011-01-08 DIAGNOSIS — F419 Anxiety disorder, unspecified: Secondary | ICD-10-CM | POA: Diagnosis present

## 2011-01-08 HISTORY — DX: Depression, unspecified: F32.A

## 2011-01-08 HISTORY — DX: Unspecified osteoarthritis, unspecified site: M19.90

## 2011-01-08 HISTORY — DX: Essential (primary) hypertension: I10

## 2011-01-08 HISTORY — DX: Major depressive disorder, single episode, unspecified: F32.9

## 2011-01-08 LAB — CBC
MCV: 88.5 fL (ref 78.0–100.0)
Platelets: 282 10*3/uL (ref 150–400)
RDW: 14.2 % (ref 11.5–15.5)
WBC: 8.8 10*3/uL (ref 4.0–10.5)

## 2011-01-08 LAB — DIFFERENTIAL
Basophils Absolute: 0.1 10*3/uL (ref 0.0–0.1)
Eosinophils Relative: 1 % (ref 0–5)
Lymphocytes Relative: 20 % (ref 12–46)
Neutrophils Relative %: 69 % (ref 43–77)

## 2011-01-08 LAB — URINALYSIS, ROUTINE W REFLEX MICROSCOPIC
Glucose, UA: NEGATIVE mg/dL
Hgb urine dipstick: NEGATIVE
Specific Gravity, Urine: 1.018 (ref 1.005–1.030)

## 2011-01-08 LAB — COMPREHENSIVE METABOLIC PANEL
ALT: 10 U/L (ref 0–35)
AST: 17 U/L (ref 0–37)
CO2: 21 mEq/L (ref 19–32)
Calcium: 9.9 mg/dL (ref 8.4–10.5)
GFR calc non Af Amer: 70 mL/min — ABNORMAL LOW (ref >90)
Potassium: 2.9 mEq/L — ABNORMAL LOW (ref 3.5–5.1)
Sodium: 139 mEq/L (ref 135–145)
Total Protein: 7.4 g/dL (ref 6.0–8.3)

## 2011-01-08 MED ORDER — PROMETHAZINE HCL 25 MG/ML IJ SOLN
12.5000 mg | Freq: Four times a day (QID) | INTRAMUSCULAR | Status: DC | PRN
  Administered 2011-01-08: 12.5 mg via INTRAVENOUS
  Filled 2011-01-08: qty 1

## 2011-01-08 MED ORDER — METOPROLOL TARTRATE 25 MG PO TABS
25.0000 mg | ORAL_TABLET | Freq: Two times a day (BID) | ORAL | Status: DC
  Administered 2011-01-08 – 2011-01-12 (×8): 25 mg via ORAL
  Filled 2011-01-08 (×9): qty 1

## 2011-01-08 MED ORDER — ONDANSETRON HCL 4 MG PO TABS: 4.0000 mg | ORAL_TABLET | Freq: Four times a day (QID) | ORAL | Status: DC | PRN

## 2011-01-08 MED ORDER — SIMVASTATIN 40 MG PO TABS
40.0000 mg | ORAL_TABLET | Freq: Every day | ORAL | Status: DC
  Administered 2011-01-09 – 2011-01-11 (×3): 40 mg via ORAL
  Filled 2011-01-08 (×5): qty 1

## 2011-01-08 MED ORDER — ACETAMINOPHEN 650 MG RE SUPP: 650.0000 mg | Freq: Four times a day (QID) | RECTAL | Status: DC | PRN

## 2011-01-08 MED ORDER — POTASSIUM CHLORIDE 10 MEQ/100ML IV SOLN
10.0000 meq | Freq: Once | INTRAVENOUS | Status: AC
  Administered 2011-01-08: 10 meq via INTRAVENOUS
  Filled 2011-01-08: qty 100

## 2011-01-08 MED ORDER — SODIUM CHLORIDE 0.9 % IV BOLUS (SEPSIS)
1000.0000 mL | Freq: Once | INTRAVENOUS | Status: AC
  Administered 2011-01-08: 1000 mL via INTRAVENOUS

## 2011-01-08 MED ORDER — TRAMADOL HCL 50 MG PO TABS: 50.0000 mg | ORAL_TABLET | Freq: Four times a day (QID) | ORAL | Status: DC | PRN

## 2011-01-08 MED ORDER — MORPHINE SULFATE 2 MG/ML IJ SOLN: 2.0000 mg | INTRAMUSCULAR | Status: DC | PRN

## 2011-01-08 MED ORDER — BIOTENE DRY MOUTH MT LIQD
15.0000 mL | Freq: Two times a day (BID) | OROMUCOSAL | Status: DC
Start: 2011-01-08 — End: 2011-01-12
  Administered 2011-01-08 – 2011-01-10 (×5): 15 mL via OROMUCOSAL

## 2011-01-08 MED ORDER — PROMETHAZINE HCL 25 MG RE SUPP: 25.0000 mg | Freq: Four times a day (QID) | RECTAL | Status: DC | PRN

## 2011-01-08 MED ORDER — ONDANSETRON HCL 4 MG/2ML IJ SOLN
4.0000 mg | Freq: Four times a day (QID) | INTRAMUSCULAR | Status: DC | PRN
  Administered 2011-01-08: 4 mg via INTRAVENOUS
  Filled 2011-01-08 (×2): qty 2

## 2011-01-08 MED ORDER — MORPHINE SULFATE 4 MG/ML IJ SOLN
4.0000 mg | Freq: Once | INTRAMUSCULAR | Status: AC
  Administered 2011-01-08: 4 mg via INTRAVENOUS
  Filled 2011-01-08: qty 1

## 2011-01-08 MED ORDER — ACETAMINOPHEN 325 MG PO TABS: 650.0000 mg | ORAL_TABLET | Freq: Four times a day (QID) | ORAL | Status: DC | PRN

## 2011-01-08 MED ORDER — SERTRALINE HCL 50 MG PO TABS
50.0000 mg | ORAL_TABLET | Freq: Every day | ORAL | Status: DC
  Administered 2011-01-09 – 2011-01-12 (×4): 50 mg via ORAL
  Filled 2011-01-08 (×4): qty 1

## 2011-01-08 MED ORDER — ENOXAPARIN SODIUM 40 MG/0.4ML ~~LOC~~ SOLN
40.0000 mg | SUBCUTANEOUS | Status: DC
  Administered 2011-01-09 – 2011-01-10 (×2): 40 mg via SUBCUTANEOUS
  Filled 2011-01-08 (×5): qty 0.4

## 2011-01-08 MED ORDER — CHLORHEXIDINE GLUCONATE 0.12 % MT SOLN
15.0000 mL | Freq: Two times a day (BID) | OROMUCOSAL | Status: DC
  Administered 2011-01-08 – 2011-01-11 (×7): 15 mL via OROMUCOSAL
  Filled 2011-01-08 (×10): qty 15

## 2011-01-08 MED ORDER — ALPRAZOLAM 0.5 MG PO TABS
0.5000 mg | ORAL_TABLET | Freq: Every evening | ORAL | Status: DC | PRN
  Administered 2011-01-10 (×2): 0.5 mg via ORAL
  Filled 2011-01-08 (×3): qty 1

## 2011-01-08 MED ORDER — POTASSIUM CHLORIDE IN NACL 20-0.45 MEQ/L-% IV SOLN
INTRAVENOUS | Status: DC
Start: 2011-01-08 — End: 2011-01-11
  Administered 2011-01-08 – 2011-01-10 (×3): via INTRAVENOUS
  Filled 2011-01-08 (×8): qty 1000

## 2011-01-08 MED ORDER — FLEET ENEMA 7-19 GM/118ML RE ENEM: 1.0000 | ENEMA | Freq: Once | RECTAL | Status: DC

## 2011-01-08 MED ORDER — POTASSIUM CHLORIDE 10 MEQ/100ML IV SOLN
10.0000 meq | Freq: Once | INTRAVENOUS | Status: AC
  Administered 2011-01-09: 10 meq via INTRAVENOUS
  Filled 2011-01-08: qty 100

## 2011-01-08 MED ORDER — IOHEXOL 300 MG/ML  SOLN
80.0000 mL | Freq: Once | INTRAMUSCULAR | Status: AC | PRN
Start: 2011-01-08 — End: 2011-01-08
  Administered 2011-01-08: 80 mL via INTRAVENOUS

## 2011-01-08 MED ORDER — ONDANSETRON HCL 4 MG/2ML IJ SOLN
4.0000 mg | Freq: Once | INTRAMUSCULAR | Status: AC
  Administered 2011-01-08: 4 mg via INTRAVENOUS
  Filled 2011-01-08: qty 2

## 2011-01-08 MED ORDER — POLYETHYLENE GLYCOL 3350 17 G PO PACK
17.0000 g | PACK | Freq: Every day | ORAL | Status: DC
  Administered 2011-01-09 – 2011-01-11 (×3): 17 g via ORAL
  Filled 2011-01-08 (×5): qty 1

## 2011-01-08 MED ORDER — POTASSIUM CHLORIDE 10 MEQ/100ML IV SOLN
10.0000 meq | INTRAVENOUS | Status: AC
  Administered 2011-01-08 – 2011-01-09 (×4): 10 meq via INTRAVENOUS
  Filled 2011-01-08 (×3): qty 100

## 2011-01-08 MED ORDER — HYDRALAZINE HCL 20 MG/ML IJ SOLN
10.0000 mg | Freq: Four times a day (QID) | INTRAMUSCULAR | Status: DC | PRN
  Administered 2011-01-08 – 2011-01-09 (×2): 10 mg via INTRAVENOUS
  Filled 2011-01-08: qty 0.5
  Filled 2011-01-08: qty 1

## 2011-01-08 MED ORDER — ALUM & MAG HYDROXIDE-SIMETH 200-200-20 MG/5ML PO SUSP: 30.0000 mL | Freq: Four times a day (QID) | ORAL | Status: DC | PRN

## 2011-01-08 NOTE — ED Notes (Signed)
Pt moved to TCU room 29, pt was med by previous nurse with zofran for nausea at 0818. Pt states cont to have slight nausea but no emesis at this time.

## 2011-01-08 NOTE — ED Provider Notes (Signed)
History     CSN: [id number] Arrival date & time: 01/08/2011  1:01 AM   First MD Initiated Contact with Patient 01/08/11 0123      Chief Complaint  Patient presents with  . GI Problem    (Consider location/radiation/quality/duration/timing/severity/associated sxs/prior treatment) Patient is a 77 y.o. female presenting with GI illlness. The history is provided by the patient.  GI Problem  This is a new problem. The current episode started 2 days ago. The problem occurs 5 to 10 times per day. The problem has not changed since onset.There has been no fever. Associated symptoms include abdominal pain, vomiting and chills. Pertinent negatives include no sweats, no headaches and no cough. She has tried nothing for the symptoms. Risk factors: Elderly and lives in independent living facility. Her past medical history does not include recent abdominal surgery.   Is complaining of vomiting yellow emesis every time she tries to move, have some abdominal discomfort. pain sharp is nonradiating. Moderate in severity. Patient also complaining of joint weakness unable to ambulate today. No diarrhea or known sick contacts Past Medical History  Diagnosis Date  . Hypertension   . Arthritis   . Depression     Past Surgical History  Procedure Date  . Back surgery   . Tonsillectomy     History reviewed. No pertinent family history.  History  Substance Use Topics  . Smoking status: Former Games developer  . Smokeless tobacco: Not on file  . Alcohol Use: No    OB History    Grav Para Term Preterm Abortions TAB SAB Ect Mult Living                  Review of Systems  Constitutional: Positive for chills. Negative for fever.  HENT: Negative for neck pain and neck stiffness.   Eyes: Negative for pain.  Respiratory: Negative for cough and shortness of breath.   Cardiovascular: Negative for chest pain.  Gastrointestinal: Positive for vomiting and abdominal pain.  Genitourinary: Negative for dysuria.    Musculoskeletal: Negative for back pain.  Skin: Negative for rash.  Neurological: Negative for headaches.  All other systems reviewed and are negative.    Allergies  Review of patient's allergies indicates no known allergies.  Home Medications   Current Outpatient Rx  Name Route Sig Dispense Refill  . ALPRAZOLAM 0.5 MG PO TABS Oral Take 0.5 mg by mouth at bedtime as needed.      . ATORVASTATIN CALCIUM 20 MG PO TABS Oral Take 20 mg by mouth daily.      Marland Kitchen HYDROCHLOROTHIAZIDE 25 MG PO TABS Oral Take 25 mg by mouth daily.      . SERTRALINE HCL 50 MG PO TABS Oral Take 50 mg by mouth daily.      . TRAMADOL HCL 50 MG PO TABS Oral Take 50 mg by mouth every 6 (six) hours as needed. Maximum dose= 8 tablets per day       BP 169/114  Pulse 83  Temp(Src) 98.6 F (37 C) (Oral)  Resp 21  SpO2 97%  Physical Exam  Constitutional: She is oriented to person, place, and time. She appears well-developed and well-nourished.  HENT:  Head: Normocephalic and atraumatic.       Dry mm  Eyes: Conjunctivae and EOM are normal. Pupils are equal, round, and reactive to light.  Neck: Trachea normal. Neck supple. No thyromegaly present.  Cardiovascular: Normal rate, regular rhythm, S1 normal, S2 normal and normal pulses.     No systolic  murmur is present   No diastolic murmur is present  Pulses:      Radial pulses are 2+ on the right side, and 2+ on the left side.  Pulmonary/Chest: Effort normal and breath sounds normal. She has no wheezes. She has no rhonchi. She has no rales. She exhibits no tenderness.  Abdominal: Soft. Normal appearance and bowel sounds are normal. There is no CVA tenderness and negative Murphy's sign.       Nondistended mild diffuse tenderness no rebound or guarding  Musculoskeletal:       BLE:s Calves nontender, no cords or erythema, negative Homans sign  Neurological: She is alert and oriented to person, place, and time. She has normal strength. No cranial nerve deficit or  sensory deficit. GCS eye subscore is 4. GCS verbal subscore is 5. GCS motor subscore is 6.  Skin: Skin is warm and dry. No rash noted. She is not diaphoretic.  Psychiatric: Her speech is normal.       Cooperative and appropriate    ED Course  Procedures (including critical care time)  Labs Reviewed  CBC - Abnormal; Notable for the following:    Hemoglobin 15.1 (*)    All other components within normal limits  COMPREHENSIVE METABOLIC PANEL - Abnormal; Notable for the following:    Potassium 2.9 (*)    Glucose, Bld 153 (*)    BUN 29 (*)    GFR calc non Af Amer 70 (*)    GFR calc Af Amer 81 (*)    All other components within normal limits  URINALYSIS, ROUTINE W REFLEX MICROSCOPIC - Abnormal; Notable for the following:    Appearance CLOUDY (*)    Ketones, ur 40 (*)    All other components within normal limits  DIFFERENTIAL  LIPASE, BLOOD   Dg Chest 2 View  01/08/2011  *RADIOLOGY REPORT*  Clinical Data: Vomiting, cough.  CHEST - 2 VIEW  Comparison: 09/05/2006  Findings: Hyperinflation with interstitial prominence.  Prominent aorta, measuring 4 cm in diameter, similar to prior.  Scattered atherosclerotic calcification.  Cardiac contour unchanged.  No pneumothorax. No focal consolidation.  No pleural effusion. Diffuse osteopenia.  No definite acute osseous abnormality.  IMPRESSION: Mild hyperinflation and interstitial prominence without focal consolidation identified.  Mild dilatation of the thoracic aorta, up to 4 cm.  Original Report Authenticated By: Waneta Martins, M.D.   Ct Abdomen Pelvis W Contrast  01/08/2011  *RADIOLOGY REPORT*  Clinical Data: Abdominal pain  CT ABDOMEN AND PELVIS WITH CONTRAST  Technique:  Multidetector CT imaging of the abdomen and pelvis was performed following the standard protocol during bolus administration of intravenous contrast.  Contrast: 80mL OMNIPAQUE IOHEXOL 300 MG/ML IV SOLN  Comparison: None.  Findings: Clear lung bases aortic and mitral valve  calcification.  Unremarkable liver, biliary system, pancreas, spleen, adrenal glands.  Symmetric renal enhancement.  Minimal fullness of the ureters.  The bladder is distended.  Moderate stool burden.  Colonic diverticulosis.  No overt evidence for diverticulitis.  No bowel obstruction. Small duodenal diverticulum. No free intraperitoneal air or fluid. No lymphadenopathy.  Absent uterus.  Unremarkable adnexa.  There is scattered atherosclerotic calcification of the aorta and its branches. No aneurysmal dilatation.  No acute osseous abnormality. SI joint ankylosis.  Multilevel degenerative changes.  L3-L5 posterior fusion hardware.  IMPRESSION: No acute intra-abdominal finding.  Colonic diverticulosis without diverticulitis.  Moderate stool burden.  Original Report Authenticated By: Waneta Martins, M.D.    IV fluids and Zofran for nausea vomiting. CT  and labs reviewed as above. No UTI. Patient found to be hypokalemic with potassium. On recheck has persistent nausea and persistent generalized weakness. Medicine consultation for admission. Case discussed with Dr. Esmeralda Arthur as above agrees to admission.   MDM   IVFs, labs, zofran and ct scan        Sunnie Nielsen, MD 01/08/11 [phone]

## 2011-01-08 NOTE — ED Notes (Signed)
Pt reminded to drink contrast for CT.  Family with pt to help encourage.

## 2011-01-08 NOTE — ED Notes (Signed)
md notified in ref pt receiving potassium and possible change to tele bed and was told potassium is given on reg floors.alos md made aware that pt is refusing all po and rectal meds at this time stating too nauseated.

## 2011-01-08 NOTE — ED Notes (Signed)
ZOX:[id number]<AV> Expected date:<BR> Expected time:<BR> Means of arrival:<BR> Comments:<BR> EMS/elderly/fever/hx UTI

## 2011-01-08 NOTE — H&P (Signed)
PCP:  Georgianne Fick, MD, MD   DOA:  01/08/2011  1:01 AM  Chief Complaint:  vomiting  HPI: This is a 77 year old female, who resides at an independent living, presents to the emergency room with persistent vomiting.  Patient reports she was in her usual state of health yesterday when she developed acute onset of vomiting.  She denies any diarrhea, no fever, no hematemesis.  She has some LLQ abd pain.  She is unable to tolerate anything po.  Patient had a similar presentation in march of this year.  She denies any sick contacts, has not been eating out, and does not report that anyone she knows having similar symptoms.  She denies any chest pain or shortness of breath.  She was evaluated in the ED and was found to be dehydrated, she has been referred for admission.   Allergies: No Known Allergies  Prior to Admission medications   Medication Sig Start Date End Date Taking? Authorizing Provider  ALPRAZolam Prudy Feeler) 0.5 MG tablet Take 0.5 mg by mouth at bedtime as needed.     Yes Historical Provider, MD  atorvastatin (LIPITOR) 20 MG tablet Take 20 mg by mouth daily.     Yes Historical Provider, MD  hydrochlorothiazide (HYDRODIURIL) 25 MG tablet Take 25 mg by mouth daily.     Yes Historical Provider, MD  sertraline (ZOLOFT) 50 MG tablet Take 50 mg by mouth daily.     Yes Historical Provider, MD  traMADol (ULTRAM) 50 MG tablet Take 50 mg by mouth every 6 (six) hours as needed. Maximum dose= 8 tablets per day    Yes Historical Provider, MD    Past Medical History  Diagnosis Date  . Hypertension   . Arthritis   . Depression     Past Surgical History  Procedure Date  . Back surgery   . Tonsillectomy     Social History:  reports that she has quit smoking. She does not have any smokeless tobacco history on file. She reports that she does not drink alcohol. Her drug history not on file.  History reviewed. No pertinent family history.  Review of Systems:  Constitutional: Denies  fever, chills, diaphoresis, appetite change. Positive for fatigue and generalized weakness  HEENT: Denies photophobia, eye pain, redness, hearing loss, ear pain, congestion, sore throat, rhinorrhea, sneezing, mouth sores, trouble swallowing, neck pain, neck stiffness and tinnitus.   Respiratory: Denies SOB, DOE, cough, chest tightness,  and wheezing.   Cardiovascular: Denies chest pain, palpitations and leg swelling.  Gastrointestinal: Positive for nausea, vomiting, constipation and abd pain. Genitourinary: Denies dysuria, urgency, frequency, hematuria, flank pain and difficulty urinating.  Musculoskeletal: Denies myalgias, back pain, joint swelling, arthralgias and gait problem.  Skin: Denies pallor, rash and wound.  Neurological: Denies dizziness, seizures, syncope, weakness, light-headedness, numbness and headaches.  Hematological: Denies adenopathy. Easy bruising, personal or family bleeding history  Psychiatric/Behavioral: Denies suicidal ideation, mood changes, confusion, nervousness, sleep disturbance and agitation   Physical Exam:  Filed Vitals:   01/08/11 0050 01/08/11 0315 01/08/11 0317 01/08/11 0744  BP: 178/82  190/98 169/114  Pulse: 71  73 83  Temp: 97.8 F (36.6 C) 98.1 F (36.7 C)  98.6 F (37 C)  TempSrc: Oral Rectal  Oral  Resp: 20   21  SpO2: 100%  100% 97%    Constitutional: Vital signs reviewed.  Patient is a well-developed and well-nourished in no acute distress and cooperative with exam. Alert and oriented x3.  Head: Normocephalic and atraumatic Ear: TM normal bilaterally  Mouth: no erythema or exudates, MMM Eyes: PERRL, EOMI, conjunctivae normal, No scleral icterus.  Neck: Supple, Trachea midline normal ROM, No JVD, mass, thyromegaly, or carotid bruit present.  Cardiovascular: RRR, S1 normal, S2 normal, no MRG, pulses symmetric and intact bilaterally Pulmonary/Chest: CTAB, no wheezes, rales, or rhonchi Abdominal: Soft, tender in LLQ, no rigidity or gaurding,  BS+ GU: no CVA tenderness Musculoskeletal: No joint deformities, erythema, or stiffness, ROM full and no nontender Ext: no edema and no cyanosis, pulses palpable bilaterally (DP and PT) Hematology: no cervical, inginal, or axillary adenopathy.  Neurological: A&O x3, Strenght is normal and symmetric bilaterally, cranial nerve II-XII are grossly intact, no focal motor deficit, sensory intact to light touch bilaterally.  Skin: Warm, dry and intact. No rash, cyanosis, or clubbing.  Psychiatric: Normal mood and affect. speech and behavior is normal. Judgment and thought content normal.  Labs on Admission:  Results for orders placed during the hospital encounter of 01/08/11 (from the past 48 hour(s))  CBC     Status: Abnormal   Collection Time   01/08/11  1:32 AM      Component Value Range Comment   WBC 8.8  4.0 - 10.5 (K/uL)    RBC 4.88  3.87 - 5.11 (MIL/uL)    Hemoglobin 15.1 (*) 12.0 - 15.0 (g/dL)    HCT 16.1  09.6 - 04.5 (%)    MCV 88.5  78.0 - 100.0 (fL)    MCH 30.9  26.0 - 34.0 (pg)    MCHC 35.0  30.0 - 36.0 (g/dL)    RDW 40.9  81.1 - 91.4 (%)    Platelets 282  150 - 400 (K/uL)   DIFFERENTIAL     Status: Normal   Collection Time   01/08/11  1:32 AM      Component Value Range Comment   Neutrophils Relative 69  43 - 77 (%)    Neutro Abs 6.0  1.7 - 7.7 (K/uL)    Lymphocytes Relative 20  12 - 46 (%)    Lymphs Abs 1.8  0.7 - 4.0 (K/uL)    Monocytes Relative 10  3 - 12 (%)    Monocytes Absolute 0.8  0.1 - 1.0 (K/uL)    Eosinophils Relative 1  0 - 5 (%)    Eosinophils Absolute 0.1  0.0 - 0.7 (K/uL)    Basophils Relative 1  0 - 1 (%)    Basophils Absolute 0.1  0.0 - 0.1 (K/uL)   COMPREHENSIVE METABOLIC PANEL     Status: Abnormal   Collection Time   01/08/11  1:32 AM      Component Value Range Comment   Sodium 139  135 - 145 (mEq/L)    Potassium 2.9 (*) 3.5 - 5.1 (mEq/L)    Chloride 101  96 - 112 (mEq/L)    CO2 21  19 - 32 (mEq/L)    Glucose, Bld 153 (*) 70 - 99 (mg/dL)    BUN  29 (*) 6 - 23 (mg/dL)    Creatinine, Ser 7.82  0.50 - 1.10 (mg/dL)    Calcium 9.9  8.4 - 10.5 (mg/dL)    Total Protein 7.4  6.0 - 8.3 (g/dL)    Albumin 4.2  3.5 - 5.2 (g/dL)    AST 17  0 - 37 (U/L)    ALT 10  0 - 35 (U/L)    Alkaline Phosphatase 97  39 - 117 (U/L)    Total Bilirubin 0.7  0.3 - 1.2 (mg/dL)  GFR calc non Af Amer 70 (*) >90 (mL/min)    GFR calc Af Amer 81 (*) >90 (mL/min)   LIPASE, BLOOD     Status: Normal   Collection Time   01/08/11  1:32 AM      Component Value Range Comment   Lipase 44  11 - 59 (U/L)   URINALYSIS, ROUTINE W REFLEX MICROSCOPIC     Status: Abnormal   Collection Time   01/08/11  1:51 AM      Component Value Range Comment   Color, Urine YELLOW  YELLOW     Appearance CLOUDY (*) CLEAR     Specific Gravity, Urine 1.018  1.005 - 1.030     pH 8.0  5.0 - 8.0     Glucose, UA NEGATIVE  NEGATIVE (mg/dL)    Hgb urine dipstick NEGATIVE  NEGATIVE     Bilirubin Urine NEGATIVE  NEGATIVE     Ketones, ur 40 (*) NEGATIVE (mg/dL)    Protein, ur NEGATIVE  NEGATIVE (mg/dL)    Urobilinogen, UA 1.0  0.0 - 1.0 (mg/dL)    Nitrite NEGATIVE  NEGATIVE     Leukocytes, UA NEGATIVE  NEGATIVE  MICROSCOPIC NOT DONE ON URINES WITH NEGATIVE PROTEIN, BLOOD, LEUKOCYTES, NITRITE, OR GLUCOSE <1000 mg/dL.    Radiological Exams on Admission: No results found.  Assessment/Plan   *Gastroenteritis, acute:  Patient will be admitted for observation, supportive care, rehydration and electrolyte replacement.  She does not have a fever, she has a normal wbc count, so we will hold off on administering antibiotics   HTN (hypertension):  She is taking hydrochlorothiazide as an outpatient.  We will hold this in light of her hypokalemia and dehydration.  Start her on low dose metoprolol, and prn iv hydralazine.   Vomiting: supportive care with zofran   Dehydration: supportive care with IVF   Hypokalemia, will replete, check Mg   Constipation, give enema now, and start daily miralax    Depression: continue outpatient meds   Hyperlipidemia: continue outpatient meds   Anxiety: continue outpatient meds.  DNR  Time Spent on Admission:  Munira Polson 01/08/2011, 8:28 AM

## 2011-01-08 NOTE — ED Notes (Signed)
Pt being admitted to team 1---Dr. Kerry Hough. Transferred to TCU.

## 2011-01-09 ENCOUNTER — Observation Stay (HOSPITAL_COMMUNITY): Payer: Medicare Other

## 2011-01-09 ENCOUNTER — Encounter (HOSPITAL_COMMUNITY): Payer: Self-pay | Admitting: *Deleted

## 2011-01-09 LAB — BASIC METABOLIC PANEL
CO2: 23 mEq/L (ref 19–32)
Calcium: 8.9 mg/dL (ref 8.4–10.5)
Chloride: 103 mEq/L (ref 96–112)
Creatinine, Ser: 0.86 mg/dL (ref 0.50–1.10)
Glucose, Bld: 125 mg/dL — ABNORMAL HIGH (ref 70–99)

## 2011-01-09 LAB — CBC
HCT: 41.4 % (ref 36.0–46.0)
MCH: 30.5 pg (ref 26.0–34.0)
MCV: 88.8 fL (ref 78.0–100.0)
Platelets: 290 10*3/uL (ref 150–400)
RBC: 4.66 MIL/uL (ref 3.87–5.11)
RDW: 15.1 % (ref 11.5–15.5)

## 2011-01-09 NOTE — Progress Notes (Signed)
Subjective: No events overnight. Feels better today, less nausea, tolerating liquids, family reports some confusion. Objective:  Vital signs in last 24 hours:  Filed Vitals:   01/08/11 1400 01/08/11 2159 01/08/11 2202 01/09/11 0404  BP: 162/82 135/64 135/64 150/84  Pulse: 85 95 95 74  Temp: 98 F (36.7 C)  98.4 F (36.9 C) 98.5 F (36.9 C)  TempSrc: Oral  Oral Oral  Resp: 20  20 20   Height:      Weight:      SpO2: 99%  99% 93%    Intake/Output from previous day:   Intake/Output Summary (Last 24 hours) at 01/09/11 1031 Last data filed at 01/09/11 0646  Gross per 24 hour  Intake  834.5 ml  Output      0 ml  Net  834.5 ml    Physical Exam: General: Alert, awake, oriented x3, in no acute distress. HEENT: No bruits, no goiter. Moist mucous membranes, no scleral icterus, no conjunctival pallor. Heart: Regular rate and rhythm, without murmurs, rubs, gallops. Lungs: Clear to auscultation bilaterally. No wheezing, no rhonchi, no rales.  Abdomen: Soft, nontender, nondistended, positive bowel sounds. Extremities: No clubbing cyanosis or edema,  positive pedal pulses. Neuro: Grossly intact, nonfocal.    Lab Results:  Basic Metabolic Panel:    Component Value Date/Time   NA 136 01/09/2011 0350   K 3.7 01/09/2011 0350   CL 103 01/09/2011 0350   CO2 23 01/09/2011 0350   BUN 20 01/09/2011 0350   CREATININE 0.86 01/09/2011 0350   GLUCOSE 125* 01/09/2011 0350   CALCIUM 8.9 01/09/2011 0350   CBC:    Component Value Date/Time   WBC 13.6* 01/09/2011 0350   HGB 14.2 01/09/2011 0350   HCT 41.4 01/09/2011 0350   PLT 290 01/09/2011 0350   MCV 88.8 01/09/2011 0350   NEUTROABS 6.0 01/08/2011 0132   LYMPHSABS 1.8 01/08/2011 0132   MONOABS 0.8 01/08/2011 0132   EOSABS 0.1 01/08/2011 0132   BASOSABS 0.1 01/08/2011 0132      Lab 01/09/11 0350 01/08/11 0132  WBC 13.6* 8.8  HGB 14.2 15.1*  HCT 41.4 43.2  PLT 290 282  MCV 88.8 88.5  MCH 30.5 30.9  MCHC 34.3 35.0  RDW  15.1 14.2  LYMPHSABS -- 1.8  MONOABS -- 0.8  EOSABS -- 0.1  BASOSABS -- 0.1  BANDABS -- --    Lab 01/09/11 0350 01/08/11 0132  NA 136 139  K 3.7 2.9*  CL 103 101  CO2 23 21  GLUCOSE 125* 153*  BUN 20 29*  CREATININE 0.86 0.78  CALCIUM 8.9 9.9  MG 1.9 --   No results found for this basename: INR:5,PROTIME:5 in the last 168 hours Cardiac markers: No results found for this basename: CK:3,CKMB:3,TROPONINI:3,MYOGLOBIN:3 in the last 168 hours No results found for this basename: POCBNP:3 in the last 168 hours No results found for this or any previous visit (from the past 240 hour(s)).  Studies/Results: Dg Chest 2 View  01/08/2011  *RADIOLOGY REPORT*  Clinical Data: Vomiting, cough.  CHEST - 2 VIEW  Comparison: 09/05/2006  Findings: Hyperinflation with interstitial prominence.  Prominent aorta, measuring 4 cm in diameter, similar to prior.  Scattered atherosclerotic calcification.  Cardiac contour unchanged.  No pneumothorax. No focal consolidation.  No pleural effusion. Diffuse osteopenia.  No definite acute osseous abnormality.  IMPRESSION: Mild hyperinflation and interstitial prominence without focal consolidation identified.  Mild dilatation of the thoracic aorta, up to 4 cm.  Original Report Authenticated By: Waneta Martins,  M.D.   Ct Abdomen Pelvis W Contrast  01/08/2011  *RADIOLOGY REPORT*  Clinical Data: Abdominal pain  CT ABDOMEN AND PELVIS WITH CONTRAST  Technique:  Multidetector CT imaging of the abdomen and pelvis was performed following the standard protocol during bolus administration of intravenous contrast.  Contrast: 80mL OMNIPAQUE IOHEXOL 300 MG/ML IV SOLN  Comparison: None.  Findings: Clear lung bases aortic and mitral valve calcification.  Unremarkable liver, biliary system, pancreas, spleen, adrenal glands.  Symmetric renal enhancement.  Minimal fullness of the ureters.  The bladder is distended.  Moderate stool burden.  Colonic diverticulosis.  No overt evidence for  diverticulitis.  No bowel obstruction. Small duodenal diverticulum. No free intraperitoneal air or fluid. No lymphadenopathy.  Absent uterus.  Unremarkable adnexa.  There is scattered atherosclerotic calcification of the aorta and its branches. No aneurysmal dilatation.  No acute osseous abnormality. SI joint ankylosis.  Multilevel degenerative changes.  L3-L5 posterior fusion hardware.  IMPRESSION: No acute intra-abdominal finding.  Colonic diverticulosis without diverticulitis.  Moderate stool burden.  Original Report Authenticated By: Waneta Martins, M.D.    Medications: Scheduled Meds:   . antiseptic oral rinse  15 mL Mouth Rinse q12n4p  . chlorhexidine  15 mL Mouth Rinse BID  . enoxaparin  40 mg Subcutaneous Q24H  . metoprolol tartrate  25 mg Oral BID  . polyethylene glycol  17 g Oral Daily  . potassium chloride  10 mEq Intravenous Q1 Hr x 4  . potassium chloride  10 mEq Intravenous Once  . sertraline  50 mg Oral Daily  . simvastatin  40 mg Oral Daily  . sodium phosphate  1 enema Rectal Once   Continuous Infusions:   . 0.45 % NaCl with KCl 20 mEq / L 75 mL/hr at 01/08/11 1634   PRN Meds:.acetaminophen, acetaminophen, ALPRAZolam, alum & mag hydroxide-simeth, hydrALAZINE, morphine, promethazine, promethazine, traMADol, DISCONTD: ondansetron (ZOFRAN) IV, DISCONTD: ondansetron  Assessment/Plan:  Principal Problem:  *Gastroenteritis, acute Active Problems:  HTN (hypertension)  Vomiting  Dehydration  Hypokalemia  Constipation  Depression  Hyperlipidemia  Anxiety  Plan:  Clinically patient is improving.  We will advance diet as tolerated.  Cont IVF for now.  She does have a bump in her WBC count.  We will recheck a chest xray and urine studies.  She is not having diarrhea. Leukocytosis could also be from viral infection.  Will repeat labs in am PT to see   LOS: 1 day   Angelica Marshall 01/09/2011, 10:31 AM

## 2011-01-10 LAB — CBC
Platelets: 237 10*3/uL (ref 150–400)
RBC: 4.67 MIL/uL (ref 3.87–5.11)
WBC: 9.4 10*3/uL (ref 4.0–10.5)

## 2011-01-10 LAB — URINALYSIS, ROUTINE W REFLEX MICROSCOPIC
Glucose, UA: NEGATIVE mg/dL
Hgb urine dipstick: NEGATIVE
Ketones, ur: 15 mg/dL — AB
Protein, ur: NEGATIVE mg/dL

## 2011-01-10 LAB — BASIC METABOLIC PANEL
CO2: 23 mEq/L (ref 19–32)
Glucose, Bld: 119 mg/dL — ABNORMAL HIGH (ref 70–99)
Potassium: 3.6 mEq/L (ref 3.5–5.1)
Sodium: 136 mEq/L (ref 135–145)

## 2011-01-10 LAB — DIFFERENTIAL
Lymphocytes Relative: 15 % (ref 12–46)
Lymphs Abs: 1.4 10*3/uL (ref 0.7–4.0)
Neutrophils Relative %: 72 % (ref 43–77)

## 2011-01-10 LAB — URINE MICROSCOPIC-ADD ON

## 2011-01-10 MED ORDER — DEXTROSE 5 % IV SOLN
1.0000 g | INTRAVENOUS | Status: DC
  Administered 2011-01-10: 1 g via INTRAVENOUS
  Filled 2011-01-10 (×2): qty 10

## 2011-01-10 NOTE — Progress Notes (Signed)
INITIAL ADULT NUTRITION ASSESSMENT Date: 01/10/2011   Time: 1:28 PM Reason for Assessment: Consult  ASSESSMENT: Female 77 y.o.  Dx: Gastroenteritis, acute  Past Medical History  Diagnosis Date  . Hypertension   . Arthritis   . Depression     Scheduled Meds:    . antiseptic oral rinse  15 mL Mouth Rinse q12n4p  . cefTRIAXone (ROCEPHIN) IV  1 g Intravenous Q24H  . chlorhexidine  15 mL Mouth Rinse BID  . enoxaparin  40 mg Subcutaneous Q24H  . metoprolol tartrate  25 mg Oral BID  . polyethylene glycol  17 g Oral Daily  . sertraline  50 mg Oral Daily  . simvastatin  40 mg Oral Daily  . sodium phosphate  1 enema Rectal Once   Continuous Infusions:    . 0.45 % NaCl with KCl 20 mEq / L 80 mL/hr at 01/10/11 0253   PRN Meds:.acetaminophen, acetaminophen, ALPRAZolam, alum & mag hydroxide-simeth, hydrALAZINE, morphine, promethazine, promethazine, traMADol   Ht: 5' (152.4 cm)  Wt: 137 lb 12.6 oz (62.5 kg) (01/08/11)   Ideal Wt: 100 lb (45.5 kg) % Ideal Wt: 137%  Usual Wt: 130-140 lb, per pt % Usual Wt: 100%  Body mass index is 26.91 kg/(m^2).  Food/Nutrition Related Hx:  Pt resides at independent living facility. Vomitting, abdominal pain, unable to tolerate PO PTA. PO intake not recorded, but pt reports not eating much. Pt states she does not have much of an appetite, but sometimes makes herself eat because she knows she needs to. Pt stated she usually eats 1-2 meals per day. Pt reports sometimes drinking Boost at home, but was not interested in drinking a supplement beverage at this time.  Pt also reported difficulty swallowing, but said she took a pill for this.  Labs:  CMP     Component Value Date/Time   NA 136 01/10/2011 0345   K 3.6 01/10/2011 0345   CL 105 01/10/2011 0345   CO2 23 01/10/2011 0345   GLUCOSE 119* 01/10/2011 0345   BUN 15 01/10/2011 0345   CREATININE 0.67 01/10/2011 0345   CALCIUM 9.1 01/10/2011 0345   PROT 7.4 01/08/2011 0132   ALBUMIN 4.2  01/08/2011 0132   AST 17 01/08/2011 0132   ALT 10 01/08/2011 0132   ALKPHOS 97 01/08/2011 0132   BILITOT 0.7 01/08/2011 0132   GFRNONAA 73* 01/10/2011 0345   GFRAA 85* 01/10/2011 0345    I/O last 3 completed shifts: In: 1766.3 [I.V.:1766.3] Out: -     Diet Order: Regular  IVF:     0.45 % NaCl with KCl 20 mEq / L Last Rate: 80 mL/hr at 01/10/11 0253    Estimated Nutritional Needs:   Kcal: 1250- 1450 Protein: 60-65 gm Fluid: >1.8 L  NUTRITION DIAGNOSIS: -Inadequate oral intake (NI-2.1).  Status: Ongoing  RELATED TO: abdominal pain, vomiting, changes in appetite  AS EVIDENCE BY: pt report of usual intake  MONITORING/EVALUATION(Goals): Goal: Pt to consume >75% meals. Monitor: PO intake, weight changes  EDUCATION NEEDS: -No education needs identified at this time  INTERVENTION: Encourage PO intake. Provide supplement beverage if pt would like in future.   DOCUMENTATION CODES Per approved criteria  -Not Applicable    Leonette Most 01/10/2011, 1:28 PM

## 2011-01-10 NOTE — Progress Notes (Signed)
Clinical Social Work received consult that patient is from facility: [facility].  Patient and family all in room, very happy with care and agreeable for patient to return at dc.  Patient on wait list for ALF and is first in line to receive a bed when one opens.    Full assessment placed in shadow chart.  No PT evaluation completed at this time.  Plan is to return to Independent care at dc... possible HH needed when discharged.  Ashley Jacobs, MSW LCSWA [phone]  1012am

## 2011-01-10 NOTE — Progress Notes (Signed)
Subjective: No events overnight. Feeling better, tolerated pancakes today, sat in chair with 2 person assist, family reports patient is having intermittent confusion. Objective:  Vital signs in last 24 hours:  Filed Vitals:   01/09/11 0404 01/09/11 1458 01/09/11 2054 01/10/11 0437  BP: 150/84 111/64 187/83 161/80  Pulse: 74 76 67 79  Temp: 98.5 F (36.9 C) 98.7 F (37.1 C) 98.9 F (37.2 C) 98.5 F (36.9 C)  TempSrc: Oral  Oral Oral  Resp: 20 16 18 19   Height:      Weight:      SpO2: 93% 97% 95% 95%    Intake/Output from previous day:   Intake/Output Summary (Last 24 hours) at 01/10/11 1154 Last data filed at 01/09/11 2100  Gross per 24 hour  Intake 1114.33 ml  Output      0 ml  Net 1114.33 ml    Physical Exam: General: Alert, awake, oriented x3, in no acute distress. HEENT: No bruits, no goiter. Moist mucous membranes, no scleral icterus, no conjunctival pallor. Heart: Regular rate and rhythm, without murmurs, rubs, gallops. Lungs: Clear to auscultation bilaterally. No wheezing, no rhonchi, no rales.  Abdomen: Soft, nontender, nondistended, positive bowel sounds. Extremities: No clubbing cyanosis or edema,  positive pedal pulses. Neuro: Grossly intact, nonfocal.    Lab Results:  Basic Metabolic Panel:    Component Value Date/Time   NA 136 01/10/2011 0345   K 3.6 01/10/2011 0345   CL 105 01/10/2011 0345   CO2 23 01/10/2011 0345   BUN 15 01/10/2011 0345   CREATININE 0.67 01/10/2011 0345   GLUCOSE 119* 01/10/2011 0345   CALCIUM 9.1 01/10/2011 0345   CBC:    Component Value Date/Time   WBC 9.4 01/10/2011 0345   HGB 13.8 01/10/2011 0345   HCT 41.7 01/10/2011 0345   PLT 237 01/10/2011 0345   MCV 89.3 01/10/2011 0345   NEUTROABS 6.8 01/10/2011 0345   LYMPHSABS 1.4 01/10/2011 0345   MONOABS 0.9 01/10/2011 0345   EOSABS 0.3 01/10/2011 0345   BASOSABS 0.1 01/10/2011 0345      Lab 01/10/11 0345 01/09/11 0350 01/08/11 0132  WBC 9.4 13.6* 8.8  HGB 13.8  14.2 15.1*  HCT 41.7 41.4 43.2  PLT 237 290 282  MCV 89.3 88.8 88.5  MCH 29.6 30.5 30.9  MCHC 33.1 34.3 35.0  RDW 15.1 15.1 14.2  LYMPHSABS 1.4 -- 1.8  MONOABS 0.9 -- 0.8  EOSABS 0.3 -- 0.1  BASOSABS 0.1 -- 0.1  BANDABS -- -- --    Lab 01/10/11 0345 01/09/11 0350 01/08/11 0132  NA 136 136 139  K 3.6 3.7 2.9*  CL 105 103 101  CO2 23 23 21   GLUCOSE 119* 125* 153*  BUN 15 20 29*  CREATININE 0.67 0.86 0.78  CALCIUM 9.1 8.9 9.9  MG -- 1.9 --   No results found for this basename: INR:5,PROTIME:5 in the last 168 hours Cardiac markers: No results found for this basename: CK:3,CKMB:3,TROPONINI:3,MYOGLOBIN:3 in the last 168 hours No results found for this basename: POCBNP:3 in the last 168 hours No results found for this or any previous visit (from the past 240 hour(s)).  Studies/Results: Dg Chest Port 1 View  01/09/2011  *RADIOLOGY REPORT*  Clinical Data: Leukocytosis  PORTABLE CHEST - 1 VIEW  Comparison: 01/08/2011  Findings: Heart size upper limits normal.  Tortuous atheromatous thoracic aorta.  Lungs are clear.  No effusion.  Spinal fixation hardware partially visualized.  IMPRESSION:  1.  No acute disease.  Original Report Authenticated  By: D. DANIEL HASSELL III, M.D.    Medications: Scheduled Meds:   . antiseptic oral rinse  15 mL Mouth Rinse q12n4p  . chlorhexidine  15 mL Mouth Rinse BID  . enoxaparin  40 mg Subcutaneous Q24H  . metoprolol tartrate  25 mg Oral BID  . polyethylene glycol  17 g Oral Daily  . sertraline  50 mg Oral Daily  . simvastatin  40 mg Oral Daily  . sodium phosphate  1 enema Rectal Once   Continuous Infusions:   . 0.45 % NaCl with KCl 20 mEq / L 80 mL/hr at 01/10/11 0253   PRN Meds:.acetaminophen, acetaminophen, ALPRAZolam, alum & mag hydroxide-simeth, hydrALAZINE, morphine, promethazine, promethazine, traMADol  Assessment/Plan:  Principal Problem:  *Gastroenteritis, acute Active Problems:  HTN (hypertension)  Vomiting  Dehydration   Hypokalemia  Constipation  Depression  Hyperlipidemia  Anxiety  Plan:  Enteritis appears to have resolved with supportive therapy UA shows 7-10 WBC, culture pending, with underlying confusion (which family reports is not her baseline) will start the patient on rocephin Follow up urine culture and if neg, will d/c abx Await PT eval Will likely need higher level of care, than current independent living   LOS: 2 days   MEMON,JEHANZEB 01/10/2011, 11:54 AM

## 2011-01-11 LAB — URINE CULTURE: Colony Count: 85000

## 2011-01-11 MED ORDER — CEFUROXIME AXETIL 500 MG PO TABS
500.0000 mg | ORAL_TABLET | Freq: Two times a day (BID) | ORAL | Status: DC
  Administered 2011-01-11: 500 mg via ORAL
  Filled 2011-01-11 (×3): qty 1

## 2011-01-11 NOTE — Progress Notes (Signed)
PER MED ADVISOR-UPHOLDS MD DECISION OF INPATIENT STATUS EFFECTIVE 01/08/11.MD NOTIFIED.

## 2011-01-11 NOTE — Progress Notes (Addendum)
Subjective: Patient remains confused today, tolerating po diet  Objective:  Vital signs in last 24 hours:  Filed Vitals:   01/10/11 1358 01/10/11 1827 01/10/11 2110 01/11/11 0450  BP: 105/55  150/77 155/96  Pulse: 80  76 87  Temp: 98 F (36.7 C) 99 F (37.2 C) 98.7 F (37.1 C) 97.9 F (36.6 C)  TempSrc:   Oral Oral  Resp: 20  17 20   Height:      Weight:      SpO2: 97%  96% 97%    Intake/Output from previous day:   Intake/Output Summary (Last 24 hours) at 01/11/11 1221 Last data filed at 01/10/11 2240  Gross per 24 hour  Intake 2703.33 ml  Output    701 ml  Net 2002.33 ml    Physical Exam: General: Awake, conversing, confused HEENT: No bruits, no goiter. Moist mucous membranes, no scleral icterus, no conjunctival pallor. Heart: Regular rate and rhythm, without murmurs, rubs, gallops. Lungs: Clear to auscultation bilaterally. No wheezing, no rhonchi, no rales.  Abdomen: Soft, nontender, nondistended, positive bowel sounds. Extremities: No clubbing cyanosis or edema,  positive pedal pulses. Neuro: Grossly intact, nonfocal.    Lab Results:  Basic Metabolic Panel:    Component Value Date/Time   NA 136 01/10/2011 0345   K 3.6 01/10/2011 0345   CL 105 01/10/2011 0345   CO2 23 01/10/2011 0345   BUN 15 01/10/2011 0345   CREATININE 0.67 01/10/2011 0345   GLUCOSE 119* 01/10/2011 0345   CALCIUM 9.1 01/10/2011 0345   CBC:    Component Value Date/Time   WBC 9.4 01/10/2011 0345   HGB 13.8 01/10/2011 0345   HCT 41.7 01/10/2011 0345   PLT 237 01/10/2011 0345   MCV 89.3 01/10/2011 0345   NEUTROABS 6.8 01/10/2011 0345   LYMPHSABS 1.4 01/10/2011 0345   MONOABS 0.9 01/10/2011 0345   EOSABS 0.3 01/10/2011 0345   BASOSABS 0.1 01/10/2011 0345      Lab 01/10/11 0345 01/09/11 0350 01/08/11 0132  WBC 9.4 13.6* 8.8  HGB 13.8 14.2 15.1*  HCT 41.7 41.4 43.2  PLT 237 290 282  MCV 89.3 88.8 88.5  MCH 29.6 30.5 30.9  MCHC 33.1 34.3 35.0  RDW 15.1 15.1 14.2    LYMPHSABS 1.4 -- 1.8  MONOABS 0.9 -- 0.8  EOSABS 0.3 -- 0.1  BASOSABS 0.1 -- 0.1  BANDABS -- -- --    Lab 01/10/11 0345 01/09/11 0350 01/08/11 0132  NA 136 136 139  K 3.6 3.7 2.9*  CL 105 103 101  CO2 23 23 21   GLUCOSE 119* 125* 153*  BUN 15 20 29*  CREATININE 0.67 0.86 0.78  CALCIUM 9.1 8.9 9.9  MG -- 1.9 --   No results found for this basename: INR:5,PROTIME:5 in the last 168 hours Cardiac markers: No results found for this basename: CK:3,CKMB:3,TROPONINI:3,MYOGLOBIN:3 in the last 168 hours No results found for this basename: POCBNP:3 in the last 168 hours No results found for this or any previous visit (from the past 240 hour(s)).  Studies/Results: No results found.  Medications: Scheduled Meds:   . antiseptic oral rinse  15 mL Mouth Rinse q12n4p  . cefTRIAXone (ROCEPHIN) IV  1 g Intravenous Q24H  . chlorhexidine  15 mL Mouth Rinse BID  . enoxaparin  40 mg Subcutaneous Q24H  . metoprolol tartrate  25 mg Oral BID  . polyethylene glycol  17 g Oral Daily  . sertraline  50 mg Oral Daily  . simvastatin  40 mg Oral Daily  .  sodium phosphate  1 enema Rectal Once   Continuous Infusions:   . 0.45 % NaCl with KCl 20 mEq / L 80 mL/hr at 01/10/11 2240   PRN Meds:.acetaminophen, acetaminophen, ALPRAZolam, alum & mag hydroxide-simeth, hydrALAZINE, morphine, promethazine, promethazine, traMADol  Assessment/Plan:  Principal Problem:  *Gastroenteritis, acute Active Problems:  HTN (hypertension)  Vomiting  Dehydration  Hypokalemia  Constipation  Depression  Hyperlipidemia  Anxiety  Plan:  Continue rocephin and follow up urine culture to r/o UTI Patients confusion may also be secondary to hospital disorientation PT eval pending Tolerating diet Dehydration improved with IVF, d/c IVF Will likely need higher level of care, CSW aware.   LOS: 3 days   MEMON,JEHANZEB 01/11/2011, 12:21 PM  Patient pulled out IV overnight and is refusing to have another placed.   Since she is not febrile and tolerating po, will continue with oral ceftin

## 2011-01-11 NOTE — Progress Notes (Signed)
Patient pulled IV out . Confused will not allow restart. See new order to D/C until further notice.

## 2011-01-11 NOTE — Progress Notes (Signed)
Physical Therapy Evaluation Patient Details Name: Angelica Marshall MRN: [id number] DOB: 1915-11-01 Today's Date: 01/11/2011 1500-1530 Ev2  Problem List:  Patient Active Problem List  Diagnoses  . HTN (hypertension)  . Vomiting  . Gastroenteritis, acute  . Dehydration  . Hypokalemia  . Constipation  . Depression  . Hyperlipidemia  . Anxiety    Past Medical History:  Past Medical History  Diagnosis Date  . Hypertension   . Arthritis   . Depression    Past Surgical History:  Past Surgical History  Procedure Date  . Back surgery   . Tonsillectomy     PT Assessment/Plan/Recommendation PT Assessment Clinical Impression Statement: Patient admitted with gastroenteritis presents with decreased activity tolerance, decreased balance, pain and generalized weakness limiting independence with mobility.  She will benefit from skilled PT in the acute care setting to address issues and allow for eventual return to ALF after SNF stay. PT Recommendation/Assessment: Patient will need skilled PT in the acute care venue PT Problem List: Decreased balance;Decreased mobility;Decreased activity tolerance;Decreased strength;Pain;Decreased range of motion PT Therapy Diagnosis : Abnormality of gait;Generalized weakness PT Plan PT Frequency: Min 3X/week PT Treatment/Interventions: Gait training;DME instruction;Patient/family education;Functional mobility training;Therapeutic exercise;Balance training PT Recommendation Follow Up Recommendations: Skilled nursing facility Equipment Recommended: Defer to next venue PT Goals  Acute Rehab PT Goals PT Goal Formulation: With patient Time For Goal Achievement: 2 weeks Pt will go Supine/Side to Sit: with supervision PT Goal: Supine/Side to Sit - Progress: Other (comment) (not addressed on eval) Pt will go Sit to Supine/Side: with supervision PT Goal: Sit to Supine/Side - Progress: Other (comment) (not addressed on eval) Pt will Transfer Sit to  Stand/Stand to Sit: with supervision PT Transfer Goal: Sit to Stand/Stand to Sit - Progress: Progressing toward goal Pt will Stand: with supervision;with bilateral upper extremity support;3 - 5 min (performing simple functional task) PT Goal: Stand - Progress: Progressing toward goal Pt will Ambulate: 51 - 150 feet;with supervision;with rolling walker PT Goal: Ambulate - Progress: Progressing toward goal  PT Evaluation Precautions/Restrictions  Precautions Precautions: Fall Prior Functioning  Home Living Lives With: Alone Type of Home: Assisted living Home Adaptive Equipment: Walker - rolling;Other (comment) (life alert button) Prior Function Level of Independence: Requires assistive device for independence;Independent with gait;Independent with basic ADLs Cognition Cognition Arousal/Alertness: Awake/alert Overall Cognitive Status: Impaired (she reports it is improved since admit, but still impaired) Orientation Level: Oriented to situation;Oriented to person;Disoriented to place (knows she's in hospital, but not to name of hospital) Sensation/Coordination Sensation Light Touch: Appears Intact Extremity Assessment RLE Assessment RLE Assessment: Exceptions to [facility] RLE AROM (degrees) Overall AROM Right Lower Extremity: Deficits RLE Overall AROM Comments: limited ankle dorsiflexion approx -15 degrees from neutral RLE Strength RLE Overall Strength: Within Functional Limits for tasks assessed LLE Assessment LLE Assessment: Exceptions to [facility] LLE AROM (degrees) Overall AROM Left Lower Extremity: Deficits LLE Overall AROM Comments: limited ankle dorsiflexion approx -15 degrees from neutral LLE Strength LLE Overall Strength: Within Functional Limits for tasks assessed Mobility (including Balance) Transfers Sit to Stand: 4: Min assist;From chair/3-in-1;With upper extremity assist;With armrests Sit to Stand Details (indicate cue type and reason): cues for scooting to edge of chair and  push up from armrests Stand to Sit: 5: Supervision;To chair/3-in-1;With upper extremity assist Stand to Sit Details: cues to reach for armrests Ambulation/Gait Ambulation/Gait Assistance: 3: Mod assist Ambulation/Gait Assistance Details (indicate cue type and reason): leans posterior, decreased ankle dorsiflexion and c/o pain in arch of foot Ambulation Distance (Feet): 100 Feet Assistive  device: Rolling walker Gait Pattern: Decreased stride length;Shuffle    Exercise    End of Session PT - End of Session Equipment Utilized During Treatment: Gait belt Activity Tolerance: Patient tolerated treatment well Patient left: in chair;with call bell in reach General Behavior During Session: [facility] for tasks performed Cognition: [facility] for tasks performed  [facility] 01/11/2011, 4:24 PM

## 2011-01-11 NOTE — Progress Notes (Signed)
UR CHART REVIEWED 

## 2011-01-12 MED ORDER — ALPRAZOLAM 0.5 MG PO TABS: 0.5000 mg | ORAL_TABLET | Freq: Two times a day (BID) | ORAL | Status: DC | PRN

## 2011-01-12 MED ORDER — ALPRAZOLAM 0.25 MG PO TABS
0.2500 mg | ORAL_TABLET | Freq: Once | ORAL | Status: AC
  Administered 2011-01-12: 0.25 mg via ORAL

## 2011-01-12 MED ORDER — POLYETHYLENE GLYCOL 3350 17 G PO PACK: 17.0000 g | PACK | Freq: Every day | ORAL | Status: AC

## 2011-01-12 MED ORDER — METOPROLOL TARTRATE 25 MG PO TABS: 25.0000 mg | ORAL_TABLET | Freq: Two times a day (BID) | ORAL | Status: DC

## 2011-01-12 NOTE — Discharge Summary (Signed)
Patient ID: Lillion Elbert MRN: [id number] DOB/AGE: 08/23/1915 77 y.o.  Admit date: 01/08/2011 Discharge date: 01/12/2011  Primary Care Physician:  Georgianne Fick, MD, MD  Discharge Diagnoses:    Present on Admission:  .HTN (hypertension) .Vomiting .Gastroenteritis, acute .Dehydration .Hypokalemia .Constipation .Depression .Hyperlipidemia .Anxiety    Current Discharge Medication List    START taking these medications   Details  metoprolol tartrate (LOPRESSOR) 25 MG tablet Take 1 tablet (25 mg total) by mouth 2 (two) times daily. Qty: 60 tablet, Refills: 0    polyethylene glycol (MIRALAX / GLYCOLAX) packet Take 17 g by mouth daily. Qty: 14 each, Refills: 0      CONTINUE these medications which have CHANGED   Details  ALPRAZolam (XANAX) 0.5 MG tablet Take 1 tablet (0.5 mg total) by mouth 2 (two) times daily as needed for sleep or anxiety. Qty: 10 tablet, Refills: 0      CONTINUE these medications which have NOT CHANGED   Details  atorvastatin (LIPITOR) 20 MG tablet Take 20 mg by mouth daily.      esomeprazole (NEXIUM) 40 MG capsule Take 40 mg by mouth daily before breakfast.      fexofenadine (ALLEGRA) 180 MG tablet Take 180 mg by mouth daily.      hydrochlorothiazide (HYDRODIURIL) 25 MG tablet Take 25 mg by mouth daily.      promethazine (PHENERGAN) 25 MG tablet Take 25 mg by mouth every 6 (six) hours as needed. For nausea     sertraline (ZOLOFT) 50 MG tablet Take 50 mg by mouth daily.      traMADol (ULTRAM) 50 MG tablet Take 50-100 mg by mouth every 6 (six) hours as needed. For pain Maximum dose= 8 tablets per day       STOP taking these medications     naproxen sodium (ANAPROX) 220 MG tablet         Disposition and Follow-up: follow up with primary doctor once discharged from facility  Consults:  none  Significant Diagnostic Studies:  Dg Chest 2 View  01/08/2011  *RADIOLOGY REPORT*  Clinical Data: Vomiting, cough.  CHEST - 2 VIEW   Comparison: 09/05/2006  Findings: Hyperinflation with interstitial prominence.  Prominent aorta, measuring 4 cm in diameter, similar to prior.  Scattered atherosclerotic calcification.  Cardiac contour unchanged.  No pneumothorax. No focal consolidation.  No pleural effusion. Diffuse osteopenia.  No definite acute osseous abnormality.  IMPRESSION: Mild hyperinflation and interstitial prominence without focal consolidation identified.  Mild dilatation of the thoracic aorta, up to 4 cm.  Original Report Authenticated By: Waneta Martins, M.D.   Ct Abdomen Pelvis W Contrast  01/08/2011  *RADIOLOGY REPORT*  Clinical Data: Abdominal pain  CT ABDOMEN AND PELVIS WITH CONTRAST  Technique:  Multidetector CT imaging of the abdomen and pelvis was performed following the standard protocol during bolus administration of intravenous contrast.  Contrast: 80mL OMNIPAQUE IOHEXOL 300 MG/ML IV SOLN  Comparison: None.  Findings: Clear lung bases aortic and mitral valve calcification.  Unremarkable liver, biliary system, pancreas, spleen, adrenal glands.  Symmetric renal enhancement.  Minimal fullness of the ureters.  The bladder is distended.  Moderate stool burden.  Colonic diverticulosis.  No overt evidence for diverticulitis.  No bowel obstruction. Small duodenal diverticulum. No free intraperitoneal air or fluid. No lymphadenopathy.  Absent uterus.  Unremarkable adnexa.  There is scattered atherosclerotic calcification of the aorta and its branches. No aneurysmal dilatation.  No acute osseous abnormality. SI joint ankylosis.  Multilevel degenerative changes.  L3-L5 posterior fusion hardware.  IMPRESSION: No acute intra-abdominal finding.  Colonic diverticulosis without diverticulitis.  Moderate stool burden.  Original Report Authenticated By: Waneta Martins, M.D.    Brief H and P: For complete details please refer to admission H and P, but in brief This is a 77 year old female, who resides at an independent living,  presents to the emergency room with persistent vomiting. Patient reports she was in her usual state of health yesterday when she developed acute onset of vomiting. She denies any diarrhea, no fever, no hematemesis. She has some LLQ abd pain. She is unable to tolerate anything po. Patient had a similar presentation in march of this year. She denies any sick contacts, has not been eating out, and does not report that anyone she knows having similar symptoms. She denies any chest pain or shortness of breath. She was evaluated in the ED and was found to be dehydrated, she has been referred for admission.    Physical Exam on Discharge:  Filed Vitals:   01/11/11 2300 01/12/11 0410 01/12/11 0412 01/12/11 0429  BP: 123/71 171/79 170/94 162/80  Pulse: 81 65    Temp: 98.5 F (36.9 C) 99.1 F (37.3 C)    TempSrc: Oral Oral    Resp: 20 20    Height:      Weight:      SpO2: 93% 98%       Intake/Output Summary (Last 24 hours) at 01/12/11 1038 Last data filed at 01/11/11 1939  Gross per 24 hour  Intake    240 ml  Output    200 ml  Net     40 ml    General: Alert, awake, appears to be very anxious HEENT: No bruits, no goiter. Heart: Regular rate and rhythm, without murmurs, rubs, gallops. Lungs: Clear to auscultation bilaterally. Abdomen: Soft, nontender, nondistended, positive bowel sounds. Extremities: No clubbing cyanosis or edema with positive pedal pulses. Neuro: Grossly intact, nonfocal.  CBC:    Component Value Date/Time   WBC 9.4 01/10/2011 0345   HGB 13.8 01/10/2011 0345   HCT 41.7 01/10/2011 0345   PLT 237 01/10/2011 0345   MCV 89.3 01/10/2011 0345   NEUTROABS 6.8 01/10/2011 0345   LYMPHSABS 1.4 01/10/2011 0345   MONOABS 0.9 01/10/2011 0345   EOSABS 0.3 01/10/2011 0345   BASOSABS 0.1 01/10/2011 0345    Basic Metabolic Panel:    Component Value Date/Time   NA 136 01/10/2011 0345   K 3.6 01/10/2011 0345   CL 105 01/10/2011 0345   CO2 23 01/10/2011 0345   BUN 15  01/10/2011 0345   CREATININE 0.67 01/10/2011 0345   GLUCOSE 119* 01/10/2011 0345   CALCIUM 9.1 01/10/2011 0345    Hospital Course:  Principal Problem:  *Gastroenteritis, acute Active Problems:  HTN (hypertension)  Vomiting  Dehydration  Hypokalemia  Constipation  Depression  Hyperlipidemia  Anxiety  Patient was admitted to the hospital. She was given supportive treatment for her gastroenteritis, with antiemetics and iv fluids.  Her symptoms resolved and she is tolerating an oral diet.  Patient did also develop some confusion.  Initial concern was for an underlying UTI.  She was started empirically on rocephin and cultures were sent.  Urine culture showed insignificant growth of multiple bacteria.  Patient is not febrile and has a normal wbc count.  Will discontinue abx.  Her confusion is likely related to her anxiety/age, worsened by being in an unfamiliar environment in the hospital.  Patient xanax dose was increased in the hospital.  She  does have significant anxiety issues.  Patient was seen by physical therapy and recommended SNF placement for rehab.  Patient will be discharged today for continued physical therapy.  Time spent on Discharge:  Signed: Laisa Larrick 01/12/2011, 10:38 AM

## 2011-03-31 ENCOUNTER — Encounter: Payer: Self-pay | Admitting: *Deleted

## 2011-04-01 ENCOUNTER — Ambulatory Visit (INDEPENDENT_AMBULATORY_CARE_PROVIDER_SITE_OTHER): Payer: Medicare Other | Admitting: Endocrinology

## 2011-04-01 ENCOUNTER — Encounter: Payer: Self-pay | Admitting: Endocrinology

## 2011-04-01 DIAGNOSIS — E041 Nontoxic single thyroid nodule: Secondary | ICD-10-CM

## 2011-04-01 DIAGNOSIS — E042 Nontoxic multinodular goiter: Secondary | ICD-10-CM

## 2011-04-01 NOTE — Patient Instructions (Addendum)
i don't think you need any biopsy or other follow-up of the thyroid nodule. However, most of the time, a "lumpy thyroid" will eventually become overactive.  this is usually a slow process, happening over the span of years.  Therefore, you should have the thyroid blood test once a year.  i would be happy to see you back if the blood test were to become abnormal

## 2011-04-01 NOTE — Progress Notes (Signed)
Subjective:    Patient ID: Angelica Marshall, female    DOB: 09-24-1915, 78 y.o.   MRN: [id number]  HPI Pt was recently noted on a routine physical exam to have a right thyroid nodule.  Pt says she does not notice the nodule. Son reports pt has 1-2 mos of slight change in her voice (a soft quality), but no assoc swelling at the anterior neck.   Past Medical History  Diagnosis Date  . Hypertension   . Arthritis   . Depression   . Allergic rhinitis due to other allergen   . Other and unspecified hyperlipidemia   . Dementia, unspecified, without behavioral disturbance   . Alzheimer's disease   . Unspecified arthropathy, shoulder region   . Anorexia   . Undiagnosed cardiac murmurs   . GERD (gastroesophageal reflux disease)   . Chronic constipation     Past Surgical History  Procedure Date  . Back surgery   . Tonsillectomy     History   Social History  . Marital Status: Divorced    Spouse Name: N/A    Number of Children: N/A  . Years of Education: N/A   Occupational History  . Not on file.   Social History Main Topics  . Smoking status: Former Games developer  . Smokeless tobacco: Not on file  . Alcohol Use: No  . Drug Use: No  . Sexually Active: No   Other Topics Concern  . Not on file   Social History Narrative  . No narrative on file    Current Outpatient Prescriptions on File Prior to Visit  Medication Sig Dispense Refill  . ALPRAZolam (XANAX) 0.25 MG tablet Take 0.25 mg by mouth 2 (two) times daily as needed.      Marland Kitchen atorvastatin (LIPITOR) 20 MG tablet Take 20 mg by mouth daily.        Marland Kitchen esomeprazole (NEXIUM) 40 MG capsule Take 40 mg by mouth daily before breakfast.        . hydrochlorothiazide (MICROZIDE) 12.5 MG capsule Take 12.5 mg by mouth daily.      . Melatonin 3 MG TABS Take 1 tablet by mouth at bedtime.      . metoprolol tartrate (LOPRESSOR) 25 MG tablet 1/2 tablet by mouth twice daily      . polyethylene glycol (MIRALAX / GLYCOLAX) packet Take 17 g by mouth  daily as needed.      . traMADol (ULTRAM) 50 MG tablet Take 50 mg by mouth every morning. Also on MAR take 2 tablets by mouth every 6 hours as needed for pain        No Known Allergies  No family history on file. No goiter or other thyroid problem BP 124/82  Pulse 69  Temp(Src) 97 F (36.1 C) (Oral)  Ht 5\' 2"  (1.575 m)  Wt 132 lb 3.2 oz (59.966 kg)  BMI 24.18 kg/m2  SpO2 96%    Review of Systems denies weight loss, headache, palpitations, sob, diarrhea, polyuria, myalgias, excessive diaphoresis, numbness, tremor, menopausal sxs, and easy bruising.  Visual loss is attributed to macular degeneration.  She has anxiety and rhinorrhea.     Objective:   Physical Exam Vital signs: see vs page Gen: elderly, frail, no distress HEAD: head: no deformity eyes: no periorbital swelling, no proptosis external nose and ears are normal mouth: no lesion seen NECK: there is perhaps slight enlargement of the right thyroid lobe, but i can't tell the nodule for sure. CHEST WALL: no deformity LUNGS:  Clear to  auscultation CV: reg rate and rhythm, no murmur MUSCULOSKELETAL: muscle bulk and strength are grossly normal for age.  no obvious joint swelling.  gait is steady with her walker EXTEMITIES: no deformity of the hands, except for oa changes.  no edema of the legs NEURO:  cn 2-12 grossly intact.   readily moves all 4's.  sensation is intact to touch on all 4's SKIN:  Normal texture and temperature.  No rash or suspicious lesion is visible.   NODES:  None palpable at the neck PSYCH: alert, oriented x3.  Does not appear anxious nor depressed.    outside test results are reviewed: Tsh=.07 (i reviewed [place] report)    Assessment & Plan:  Thyroid nodule.  She is at risk for hyperthyroidism Advanced age.  Due to this, son says (and i agree) that pt would have high risk of surgery if bx resut was worrisome. Therefore, she does not need bx. Change in voice.  Unlikely to be  thyroid-related Anxiety.  Not thyroid-related

## 2011-04-04 DIAGNOSIS — E041 Nontoxic single thyroid nodule: Secondary | ICD-10-CM | POA: Insufficient documentation

## 2011-04-04 DIAGNOSIS — E042 Nontoxic multinodular goiter: Secondary | ICD-10-CM | POA: Insufficient documentation

## 2011-05-30 ENCOUNTER — Emergency Department (HOSPITAL_COMMUNITY)
Admission: EM | Admit: 2011-05-30 | Discharge: 2011-05-30 | Disposition: A | Discharge status: Hospice | Payer: Medicare Other | Attending: Emergency Medicine | Admitting: Emergency Medicine

## 2011-05-30 ENCOUNTER — Encounter (HOSPITAL_COMMUNITY): Payer: Self-pay | Admitting: *Deleted

## 2011-05-30 ENCOUNTER — Emergency Department (HOSPITAL_COMMUNITY): Payer: Medicare Other

## 2011-05-30 DIAGNOSIS — F29 Unspecified psychosis not due to a substance or known physiological condition: Secondary | ICD-10-CM | POA: Insufficient documentation

## 2011-05-30 DIAGNOSIS — R636 Underweight: Secondary | ICD-10-CM | POA: Insufficient documentation

## 2011-05-30 DIAGNOSIS — M129 Arthropathy, unspecified: Secondary | ICD-10-CM | POA: Insufficient documentation

## 2011-05-30 DIAGNOSIS — Z79899 Other long term (current) drug therapy: Secondary | ICD-10-CM | POA: Insufficient documentation

## 2011-05-30 DIAGNOSIS — R5381 Other malaise: Secondary | ICD-10-CM | POA: Insufficient documentation

## 2011-05-30 DIAGNOSIS — F329 Major depressive disorder, single episode, unspecified: Secondary | ICD-10-CM | POA: Insufficient documentation

## 2011-05-30 DIAGNOSIS — E785 Hyperlipidemia, unspecified: Secondary | ICD-10-CM | POA: Insufficient documentation

## 2011-05-30 DIAGNOSIS — G309 Alzheimer's disease, unspecified: Secondary | ICD-10-CM | POA: Insufficient documentation

## 2011-05-30 DIAGNOSIS — F3289 Other specified depressive episodes: Secondary | ICD-10-CM | POA: Insufficient documentation

## 2011-05-30 DIAGNOSIS — I10 Essential (primary) hypertension: Secondary | ICD-10-CM | POA: Insufficient documentation

## 2011-05-30 DIAGNOSIS — E860 Dehydration: Secondary | ICD-10-CM | POA: Insufficient documentation

## 2011-05-30 DIAGNOSIS — R109 Unspecified abdominal pain: Secondary | ICD-10-CM | POA: Insufficient documentation

## 2011-05-30 DIAGNOSIS — F0280 Dementia in other diseases classified elsewhere without behavioral disturbance: Secondary | ICD-10-CM | POA: Insufficient documentation

## 2011-05-30 DIAGNOSIS — K219 Gastro-esophageal reflux disease without esophagitis: Secondary | ICD-10-CM | POA: Insufficient documentation

## 2011-05-30 DIAGNOSIS — R5383 Other fatigue: Secondary | ICD-10-CM | POA: Insufficient documentation

## 2011-05-30 DIAGNOSIS — N390 Urinary tract infection, site not specified: Secondary | ICD-10-CM | POA: Insufficient documentation

## 2011-05-30 DIAGNOSIS — R630 Anorexia: Secondary | ICD-10-CM | POA: Insufficient documentation

## 2011-05-30 LAB — BASIC METABOLIC PANEL
BUN: 19 mg/dL (ref 6–23)
CO2: 27 mEq/L (ref 19–32)
Calcium: 9.1 mg/dL (ref 8.4–10.5)
Creatinine, Ser: 0.9 mg/dL (ref 0.50–1.10)
GFR calc non Af Amer: 53 mL/min — ABNORMAL LOW (ref >90)
Glucose, Bld: 133 mg/dL — ABNORMAL HIGH (ref 70–99)

## 2011-05-30 LAB — URINALYSIS, ROUTINE W REFLEX MICROSCOPIC
Bilirubin Urine: NEGATIVE
Glucose, UA: NEGATIVE mg/dL
Ketones, ur: 40 mg/dL — AB
Protein, ur: NEGATIVE mg/dL
Urobilinogen, UA: 0.2 mg/dL (ref 0.0–1.0)

## 2011-05-30 LAB — CBC
MCH: 31.2 pg (ref 26.0–34.0)
MCHC: 34.6 g/dL (ref 30.0–36.0)
MCV: 90.1 fL (ref 78.0–100.0)
Platelets: 224 10*3/uL (ref 150–400)
RBC: 4.65 MIL/uL (ref 3.87–5.11)

## 2011-05-30 LAB — DIFFERENTIAL
Basophils Relative: 0 % (ref 0–1)
Eosinophils Absolute: 0.4 10*3/uL (ref 0.0–0.7)
Eosinophils Relative: 3 % (ref 0–5)
Lymphs Abs: 0.6 10*3/uL — ABNORMAL LOW (ref 0.7–4.0)
Neutrophils Relative %: 88 % — ABNORMAL HIGH (ref 43–77)

## 2011-05-30 LAB — URINE MICROSCOPIC-ADD ON

## 2011-05-30 MED ORDER — CEPHALEXIN 250 MG PO CAPS: 250.0000 mg | ORAL_CAPSULE | Freq: Four times a day (QID) | ORAL | Status: AC

## 2011-05-30 MED ORDER — SODIUM CHLORIDE 0.9 % IV BOLUS (SEPSIS)
500.0000 mL | Freq: Once | INTRAVENOUS | Status: AC
  Administered 2011-05-30: 500 mL via INTRAVENOUS

## 2011-05-30 MED ORDER — CEPHALEXIN 250 MG PO CAPS
250.0000 mg | ORAL_CAPSULE | Freq: Once | ORAL | Status: AC
  Administered 2011-05-30: 250 mg via ORAL

## 2011-05-30 MED ORDER — CEPHALEXIN 250 MG PO CAPS
250.0000 mg | ORAL_CAPSULE | Freq: Once | ORAL | Status: DC
  Filled 2011-05-30: qty 1

## 2011-05-30 MED ORDER — ONDANSETRON HCL 4 MG/2ML IJ SOLN
4.0000 mg | Freq: Once | INTRAMUSCULAR | Status: AC
  Administered 2011-05-30: 4 mg via INTRAVENOUS
  Filled 2011-05-30: qty 2

## 2011-05-30 MED ORDER — SODIUM CHLORIDE 0.9 % IV SOLN: INTRAVENOUS | Status: DC

## 2011-05-30 NOTE — ED Notes (Signed)
[id number]<AV> Expected date:<BR> Expected time: 3:52 PM<BR> Means of arrival:Ambulance<BR> Comments:<BR> PTAR 35 -- UTI; Weakness

## 2011-05-30 NOTE — ED Notes (Signed)
Per EMS- pt in c/o weakness and decreased intake, dx with UTI on Friday and started on macrobid

## 2011-05-30 NOTE — ED Provider Notes (Signed)
History     CSN: [id number]  Arrival date & time 05/30/11  1551   First MD Initiated Contact with Patient 05/30/11 1725      Chief Complaint  Patient presents with  . Urinary Tract Infection    (Consider location/radiation/quality/duration/timing/severity/associated sxs/prior treatment) HPI Comments: Angelica Marshall is a 78 y.o. Female who presents with confusion, weakness, and decreased oral intake. She was started on nitrofurantoin 2 days ago by her doctor at her nursing care facility. Per the family. She frequently gets urinary tract infections. Patient is unable to contribute to history. Family members are concerned that she is hallucinating. They also report that she is either coughing or vomiting. They are unable to be specific about the symptoms.   Level V Caveat-dementia  The history is provided by the patient.    Past Medical History  Diagnosis Date  . Hypertension   . Arthritis   . Depression   . Allergic rhinitis due to other allergen   . Other and unspecified hyperlipidemia   . Dementia, unspecified, without behavioral disturbance   . Alzheimer's disease   . Unspecified arthropathy, shoulder region   . Anorexia   . Undiagnosed cardiac murmurs   . GERD (gastroesophageal reflux disease)   . Chronic constipation     Past Surgical History  Procedure Date  . Back surgery   . Tonsillectomy     History reviewed. No pertinent family history.  History  Substance Use Topics  . Smoking status: Former Games developer  . Smokeless tobacco: Not on file  . Alcohol Use: No    OB History    Grav Para Term Preterm Abortions TAB SAB Ect Mult Living                  Review of Systems  Unable to perform ROS   Allergies  Review of patient's allergies indicates no known allergies.  Home Medications   Current Outpatient Rx  Name Route Sig Dispense Refill  . ALPRAZOLAM 0.25 MG PO TABS Oral Take 0.25 mg by mouth 2 (two) times daily as needed. Sleep/anxiety.    .  ATORVASTATIN CALCIUM 20 MG PO TABS Oral Take 20 mg by mouth daily.      Marland Kitchen CRANBERRY EXTRACT PO Oral Take 675 mg by mouth 2 (two) times daily. At 8am and 8pm. Tablet.    Marland Kitchen DICLOFENAC SODIUM 1 % TD GEL Topical Apply 1 application topically 4 (four) times daily as needed. Pain(Site not specified.)  May keep in possession and self-administer.    Marland Kitchen ESOMEPRAZOLE MAGNESIUM 40 MG PO CPDR Oral Take 40 mg by mouth daily before breakfast.      . HYDROCHLOROTHIAZIDE 12.5 MG PO CAPS Oral Take 12.5 mg by mouth daily.    Marland Kitchen LORATADINE 10 MG PO TABS Oral Take 10 mg by mouth daily.    Marland Kitchen MELATONIN 3 MG PO TABS Oral Take 1 tablet by mouth at bedtime.    Marland Kitchen METOPROLOL TARTRATE 25 MG PO TABS  12.5 mg. 1/2 tablet by mouth twice daily at 8am and 5pm.    . OVER THE COUNTER MEDICATION Other 1 application by Other route once a week. Friday.  Wash perineum with  5ml vinegar in 8oz water weekly.    Marland Kitchen POLYETHYLENE GLYCOL 3350 PO PACK Oral Take 17 g by mouth daily as needed. In 8 oz water as needed for bowel movement.    Marland Kitchen PROMETHAZINE HCL 12.5 MG PO TABS Oral Take 12.5 mg by mouth every 6 (six) hours as  needed. Nausea.    . SENNA 8.6 MG PO TABS Oral Take 1 tablet by mouth 2 (two) times daily. At 8am and 5pm.    . SERTRALINE HCL 50 MG PO TABS Oral Take 50 mg by mouth daily.    Marland Kitchen SIMETHICONE PO Oral Take by mouth See admin instructions. Take 1/2 tablet and chew every 6 hours as needed for gas.  May give up to 4 tabs/24hr period.    Marland Kitchen TRAMADOL HCL 50 MG PO TABS Oral Take 25-100 mg by mouth See admin instructions. Take 1/2 tablet(25mg ) by mouth twice daily as needed for knee pain.  Take 1 tablet by mouth every 6 hours as needed for mild to moderate pain.  Take 2 tablets by mouth every 6 hours as needed for pain.    . CEPHALEXIN 250 MG PO CAPS Oral Take 1 capsule (250 mg total) by mouth 4 (four) times daily. 28 capsule 0  . CEPHALEXIN 250 MG PO CAPS Oral Take 1 capsule (250 mg total) by mouth 4 (four) times daily. 28 capsule 0  .  OFLOXACIN 0.3 % OP SOLN Ophthalmic Apply 1 drop to eye See admin instructions. Instill 1 drop 2 times daily into left eye day of procedure.  Instill 1 drop in left eye 3 times daily after procedure.      BP 137/86  Pulse 81  Temp(Src) 99.4 F (37.4 C) (Rectal)  Resp 18  SpO2 99%  Physical Exam  Nursing note and vitals reviewed. Constitutional: She is oriented to person, place, and time. She appears well-developed.       She is frail and underweight  HENT:  Head: Normocephalic and atraumatic.  Eyes: Conjunctivae and EOM are normal. Pupils are equal, round, and reactive to light.  Neck: Normal range of motion and phonation normal. Neck supple.  Cardiovascular: Normal rate, regular rhythm and intact distal pulses.   Pulmonary/Chest: Effort normal and breath sounds normal. She exhibits no tenderness.  Abdominal: Soft. Bowel sounds are normal. She exhibits no distension and no mass. There is tenderness (Mild suprapubic tenderness). There is no rebound and no guarding.  Musculoskeletal: Normal range of motion.  Neurological: She is alert and oriented to person, place, and time. She has normal strength. She exhibits normal muscle tone.  Skin: Skin is warm and dry.  Psychiatric: She has a normal mood and affect. Her behavior is normal.    ED Course  Procedures (including critical care time) Initial impression: UTI, with dehydration.  Emergency department treatment: IV fluids, Zofran, and oral Keflex.   Labs Reviewed  URINALYSIS, ROUTINE W REFLEX MICROSCOPIC - Abnormal; Notable for the following:    Hgb urine dipstick MODERATE (*)    Ketones, ur 40 (*)    All other components within normal limits  BASIC METABOLIC PANEL - Abnormal; Notable for the following:    Potassium 3.4 (*)    Glucose, Bld 133 (*)    GFR calc non Af Amer 53 (*)    GFR calc Af Amer 62 (*)    All other components within normal limits  CBC - Abnormal; Notable for the following:    WBC 13.1 (*)    All other  components within normal limits  DIFFERENTIAL - Abnormal; Notable for the following:    Neutrophils Relative 88 (*)    Neutro Abs 11.4 (*)    Lymphocytes Relative 4 (*)    Lymphs Abs 0.6 (*)    All other components within normal limits  URINE MICROSCOPIC-ADD ON -  Abnormal; Notable for the following:    Bacteria, UA FEW (*)    All other components within normal limits  URINE CULTURE  LAB REPORT - SCANNED   No results found.   1. Dehydration   2. UTI (lower urinary tract infection)       MDM  Elderly female, with dehydration, and evidence for urinary tract infection. She is stable for discharge with outpatient management. Doubt serious bacterial illness. Metabolic instability or impending vascular collapse  Plan: Home Medications- Keflex; Home Treatments- push fluids; Recommended follow up- PCP f/u 1 week, and prn        Flint Melter, MD 06/03/11 0730

## 2011-05-30 NOTE — ED Notes (Signed)
Pt eating applesauce with no difficulty

## 2011-05-30 NOTE — ED Notes (Addendum)
Pt family at desk stating that they have not seen the doctor yet, MD Effie Shy called and states that he will be in to assess patient soon. Charge RN aware.

## 2011-05-30 NOTE — Discharge Instructions (Signed)
Drink plenty of water. This usually means 2-3 glasses each day. See your doctor for a checkup in 2-3 days  Dehydration, Adult Dehydration is when you lose more fluids from the body than you take in. Vital organs like the kidneys, brain, and heart cannot function without a proper amount of fluids and salt. Any loss of fluids from the body can cause dehydration.  CAUSES   Vomiting.   Diarrhea.   Excessive sweating.   Excessive urine output.   Fever.  SYMPTOMS  Mild dehydration  Thirst.   Dry lips.   Slightly dry mouth.  Moderate dehydration  Very dry mouth.   Sunken eyes.   Skin does not bounce back quickly when lightly pinched and released.   Dark urine and decreased urine production.   Decreased tear production.   Headache.  Severe dehydration  Very dry mouth.   Extreme thirst.   Rapid, weak pulse (more than 100 beats per minute at rest).   Cold hands and feet.   Not able to sweat in spite of heat and temperature.   Rapid breathing.   Blue lips.   Confusion and lethargy.   Difficulty being awakened.   Minimal urine production.   No tears.  DIAGNOSIS  Your caregiver will diagnose dehydration based on your symptoms and your exam. Blood and urine tests will help confirm the diagnosis. The diagnostic evaluation should also identify the cause of dehydration. TREATMENT  Treatment of mild or moderate dehydration can often be done at home by increasing the amount of fluids that you drink. It is best to drink small amounts of fluid more often. Drinking too much at one time can make vomiting worse. Refer to the home care instructions below. Severe dehydration needs to be treated at the hospital where you will probably be given intravenous (IV) fluids that contain water and electrolytes. HOME CARE INSTRUCTIONS   Ask your caregiver about specific rehydration instructions.   Drink enough fluids to keep your urine clear or pale yellow.   Drink small amounts  frequently if you have nausea and vomiting.   Eat as you normally do.   Avoid:   Foods or drinks high in sugar.   Carbonated drinks.   Juice.   Extremely hot or cold fluids.   Drinks with caffeine.   Fatty, greasy foods.   Alcohol.   Tobacco.   Overeating.   Gelatin desserts.   Wash your hands well to avoid spreading bacteria and viruses.   Only take over-the-counter or prescription medicines for pain, discomfort, or fever as directed by your caregiver.   Ask your caregiver if you should continue all prescribed and over-the-counter medicines.   Keep all follow-up appointments with your caregiver.  SEEK MEDICAL CARE IF:  You have abdominal pain and it increases or stays in one area (localizes).   You have a rash, stiff neck, or severe headache.   You are irritable, sleepy, or difficult to awaken.   You are weak, dizzy, or extremely thirsty.  SEEK IMMEDIATE MEDICAL CARE IF:   You are unable to keep fluids down or you get worse despite treatment.   You have frequent episodes of vomiting or diarrhea.   You have blood or green matter (bile) in your vomit.   You have blood in your stool or your stool looks black and tarry.   You have not urinated in 6 to 8 hours, or you have only urinated a small amount of very dark urine.   You have a fever.  You faint.  MAKE SURE YOU:   Understand these instructions.   Will watch your condition.   Will get help right away if you are not doing well or get worse.  Document Released: 02/01/2005 Document Revised: 01/21/2011 Document Reviewed: 09/21/2010 [facility] Patient Information 2012 [place], [place].Urinary Tract Infection Infections of the urinary tract can start in several places. A bladder infection (cystitis), a kidney infection (pyelonephritis), and a prostate infection (prostatitis) are different types of urinary tract infections (UTIs). They usually get better if treated with medicines (antibiotics) that kill  germs. Take all the medicine until it is gone. You or your child may feel better in a few days, but TAKE ALL MEDICINE or the infection may not respond and may become more difficult to treat. HOME CARE INSTRUCTIONS   Drink enough water and fluids to keep the urine clear or pale yellow. Cranberry juice is especially recommended, in addition to large amounts of water.   Avoid caffeine, tea, and carbonated beverages. They tend to irritate the bladder.   Alcohol may irritate the prostate.   Only take over-the-counter or prescription medicines for pain, discomfort, or fever as directed by your caregiver.  To prevent further infections:  Empty the bladder often. Avoid holding urine for long periods of time.   After a bowel movement, women should cleanse from front to back. Use each tissue only once.   Empty the bladder before and after sexual intercourse.  FINDING OUT THE RESULTS OF YOUR TEST Not all test results are available during your visit. If your or your child's test results are not back during the visit, make an appointment with your caregiver to find out the results. Do not assume everything is normal if you have not heard from your caregiver or the medical facility. It is important for you to follow up on all test results. SEEK MEDICAL CARE IF:   There is back pain.   Your baby is older than 3 months with a rectal temperature of 100.5 F (38.1 C) or higher for more than 1 day.   Your or your child's problems (symptoms) are no better in 3 days. Return sooner if you or your child is getting worse.  SEEK IMMEDIATE MEDICAL CARE IF:   There is severe back pain or lower abdominal pain.   You or your child develops chills.   You have a fever.   Your baby is older than 3 months with a rectal temperature of 102 F (38.9 C) or higher.   Your baby is 73 months old or younger with a rectal temperature of 100.4 F (38 C) or higher.   There is nausea or vomiting.   There is continued  burning or discomfort with urination.  MAKE SURE YOU:   Understand these instructions.   Will watch your condition.   Will get help right away if you are not doing well or get worse.  Document Released: 11/11/2004 Document Revised: 01/21/2011 Document Reviewed: 06/16/2006 [facility] Patient Information 2012 [place], [place].

## 2011-06-01 LAB — URINE CULTURE
Colony Count: NO GROWTH
Culture  Setup Time: 201304150801
Culture: NO GROWTH

## 2011-08-12 ENCOUNTER — Emergency Department (HOSPITAL_BASED_OUTPATIENT_CLINIC_OR_DEPARTMENT_OTHER)
Admission: EM | Admit: 2011-08-12 | Discharge: 2011-08-13 | Disposition: A | Discharge status: Home / Self Care | Payer: Medicare Other | Attending: Emergency Medicine | Admitting: Emergency Medicine

## 2011-08-12 ENCOUNTER — Encounter (HOSPITAL_BASED_OUTPATIENT_CLINIC_OR_DEPARTMENT_OTHER): Payer: Self-pay | Admitting: *Deleted

## 2011-08-12 DIAGNOSIS — M545 Low back pain, unspecified: Secondary | ICD-10-CM | POA: Insufficient documentation

## 2011-08-12 DIAGNOSIS — G309 Alzheimer's disease, unspecified: Secondary | ICD-10-CM | POA: Insufficient documentation

## 2011-08-12 DIAGNOSIS — Y999 Unspecified external cause status: Secondary | ICD-10-CM | POA: Insufficient documentation

## 2011-08-12 DIAGNOSIS — Y9301 Activity, walking, marching and hiking: Secondary | ICD-10-CM | POA: Insufficient documentation

## 2011-08-12 DIAGNOSIS — F0280 Dementia in other diseases classified elsewhere without behavioral disturbance: Secondary | ICD-10-CM | POA: Insufficient documentation

## 2011-08-12 DIAGNOSIS — Z79899 Other long term (current) drug therapy: Secondary | ICD-10-CM | POA: Insufficient documentation

## 2011-08-12 DIAGNOSIS — Y9210 Unspecified residential institution as the place of occurrence of the external cause: Secondary | ICD-10-CM | POA: Insufficient documentation

## 2011-08-12 DIAGNOSIS — W19XXXA Unspecified fall, initial encounter: Secondary | ICD-10-CM | POA: Insufficient documentation

## 2011-08-12 DIAGNOSIS — S0003XA Contusion of scalp, initial encounter: Secondary | ICD-10-CM | POA: Insufficient documentation

## 2011-08-12 DIAGNOSIS — Z87891 Personal history of nicotine dependence: Secondary | ICD-10-CM | POA: Insufficient documentation

## 2011-08-12 NOTE — ED Notes (Signed)
Pt from [facility]. Was recently diagnosed with a UTI and is taking Cipro for it. Was using walker to ambulate to the bathroom around lost her balance. Fell on right side and hit right side of her head. Pt has a hematoma to the right side of forehead and bruise on right hip. Pt also complaining of lower back pain. Pt has chronic leg and foot pain, as well. Pt neuro intact, alert and oriented x 3. Pt denies LOC. Family at the bedside.

## 2011-08-12 NOTE — ED Notes (Signed)
Pt from [facility] assisted [facility]. Was ambulating using her walker this evening and lost balance and fell on her right side and hit her head. Pt reports no LOC. [facility] reports neuro assess WNL. Negative on the stroke scale. Pt denies pain at this time. Pt is not on any blood thinners. Pt diagnosed with a UTI recently and is on Cipro for it. Pt is DNR status.

## 2011-08-13 ENCOUNTER — Emergency Department (HOSPITAL_BASED_OUTPATIENT_CLINIC_OR_DEPARTMENT_OTHER): Payer: Medicare Other

## 2011-08-13 NOTE — Discharge Instructions (Signed)
Back Pain, Adult Low back pain is very common. About 1 in 5 people have back pain.The cause of low back pain is rarely dangerous. The pain often gets better over time.About half of people with a sudden onset of back pain feel better in just 2 weeks. About 8 in 10 people feel better by 6 weeks.  CAUSES Some common causes of back pain include:  Strain of the muscles or ligaments supporting the spine.   Wear and tear (degeneration) of the spinal discs.   Arthritis.   Direct injury to the back.  DIAGNOSIS Most of the time, the direct cause of low back pain is not known.However, back pain can be treated effectively even when the exact cause of the pain is unknown.Answering your caregiver's questions about your overall health and symptoms is one of the most accurate ways to make sure the cause of your pain is not dangerous. If your caregiver needs more information, he or she may order lab work or imaging tests (X-rays or MRIs).However, even if imaging tests show changes in your back, this usually does not require surgery. HOME CARE INSTRUCTIONS For many people, back pain returns.Since low back pain is rarely dangerous, it is often a condition that people can learn to manageon their own.   Remain active. It is stressful on the back to sit or stand in one place. Do not sit, drive, or stand in one place for more than 30 minutes at a time. Take short walks on level surfaces as soon as pain allows.Try to increase the length of time you walk each day.   Do not stay in bed.Resting more than 1 or 2 days can delay your recovery.   Do not avoid exercise or work.Your body is made to move.It is not dangerous to be active, even though your back may hurt.Your back will likely heal faster if you return to being active before your pain is gone.   Pay attention to your body when you bend and lift. Many people have less discomfortwhen lifting if they bend their knees, keep the load close to their  bodies,and avoid twisting. Often, the most comfortable positions are those that put less stress on your recovering back.   Find a comfortable position to sleep. Use a firm mattress and lie on your side with your knees slightly bent. If you lie on your back, put a pillow under your knees.   Only take over-the-counter or prescription medicines as directed by your caregiver. Over-the-counter medicines to reduce pain and inflammation are often the most helpful.Your caregiver may prescribe muscle relaxant drugs.These medicines help dull your pain so you can more quickly return to your normal activities and healthy exercise.   Put ice on the injured area.   Put ice in a plastic bag.   Place a towel between your skin and the bag.   Leave the ice on for 15 to 20 minutes, 3 to 4 times a day for the first 2 to 3 days. After that, ice and heat may be alternated to reduce pain and spasms.   Ask your caregiver about trying back exercises and gentle massage. This may be of some benefit.   Avoid feeling anxious or stressed.Stress increases muscle tension and can worsen back pain.It is important to recognize when you are anxious or stressed and learn ways to manage it.Exercise is a great option.  SEEK MEDICAL CARE IF:  You have pain that is not relieved with rest or medicine.   You have   pain that does not improve in 1 week.   You have new symptoms.   You are generally not feeling well.  SEEK IMMEDIATE MEDICAL CARE IF:   You have pain that radiates from your back into your legs.   You develop new bowel or bladder control problems.   You have unusual weakness or numbness in your arms or legs.   You develop nausea or vomiting.   You develop abdominal pain.   You feel faint.  Document Released: 02/01/2005 Document Revised: 01/21/2011 Document Reviewed: 06/22/2010 [employer] Patient Information 2012 [employer]. 

## 2011-08-13 NOTE — ED Notes (Signed)
Patient transported to X-ray 

## 2011-08-13 NOTE — ED Provider Notes (Signed)
History     CSN: [id number]  Arrival date & time 08/12/11  2240   First MD Initiated Contact with Patient 08/13/11 0024      Chief Complaint  Patient presents with  . Fall    (Consider location/radiation/quality/duration/timing/severity/associated sxs/prior treatment) HPI Level 5 Caveat: Alzheimer's. This is a 78 year old female who was ambulating with her walker at her assisted living facility and lost her balance. She fell on her right side and hit her head. There was no loss of consciousness. Her mental status is at her baseline per staff. EMS notes a contusion to the right temple. She is also having moderate pain in the lower back when she attempts to sit up; the pain is not present at rest. She was fully spinally immobilized by EMS prior to transport.  Past Medical History  Diagnosis Date  . Hypertension   . Arthritis   . Depression   . Allergic rhinitis due to other allergen   . Other and unspecified hyperlipidemia   . Dementia, unspecified, without behavioral disturbance   . Alzheimer's disease   . Unspecified arthropathy, shoulder region   . Anorexia   . Undiagnosed cardiac murmurs   . GERD (gastroesophageal reflux disease)   . Chronic constipation     Past Surgical History  Procedure Date  . Back surgery   . Tonsillectomy     History reviewed. No pertinent family history.  History  Substance Use Topics  . Smoking status: Former Games developer  . Smokeless tobacco: Not on file  . Alcohol Use: No    OB History    Grav Para Term Preterm Abortions TAB SAB Ect Mult Living                  Review of Systems  Unable to perform ROS   Allergies  Review of patient's allergies indicates no known allergies.  Home Medications   Current Outpatient Rx  Name Route Sig Dispense Refill  . ALPRAZOLAM 0.25 MG PO TABS Oral Take 0.25 mg by mouth 2 (two) times daily as needed. Sleep/anxiety.    . ATORVASTATIN CALCIUM 20 MG PO TABS Oral Take 20 mg by mouth daily.      Marland Kitchen  CIPROFLOXACIN HCL 500 MG PO TABS Oral Take 500 mg by mouth 2 (two) times daily.    Marland Kitchen CRANBERRY EXTRACT PO Oral Take 675 mg by mouth 2 (two) times daily. At 8am and 8pm. Tablet.    Marland Kitchen DICLOFENAC SODIUM 1 % TD GEL Topical Apply 1 application topically 4 (four) times daily as needed. Pain(Site not specified.)  May keep in possession and self-administer.    Marland Kitchen ESOMEPRAZOLE MAGNESIUM 40 MG PO CPDR Oral Take 40 mg by mouth daily before breakfast.      . HYDROCHLOROTHIAZIDE 12.5 MG PO CAPS Oral Take 12.5 mg by mouth daily.    Marland Kitchen LORATADINE 10 MG PO TABS Oral Take 10 mg by mouth daily.    Marland Kitchen MELATONIN 3 MG PO TABS Oral Take 1 tablet by mouth at bedtime.    Marland Kitchen METOPROLOL TARTRATE 25 MG PO TABS  12.5 mg. 1/2 tablet by mouth twice daily at 8am and 5pm.    . POLYETHYLENE GLYCOL 3350 PO PACK Oral Take 17 g by mouth daily as needed. In 8 oz water as needed for bowel movement.    Marland Kitchen PROMETHAZINE HCL 12.5 MG PO TABS Oral Take 12.5 mg by mouth every 6 (six) hours as needed. Nausea.    . SENNA 8.6 MG PO TABS Oral  Take 1 tablet by mouth 2 (two) times daily. At 8am and 5pm.    . SERTRALINE HCL 50 MG PO TABS Oral Take 50 mg by mouth daily.    . TRAMADOL HCL 50 MG PO TABS Oral Take 25-100 mg by mouth See admin instructions. Take 1/2 tablet(25mg ) by mouth twice daily as needed for knee pain.  Take 1 tablet by mouth every 6 hours as needed for mild to moderate pain.  Take 2 tablets by mouth every 6 hours as needed for pain.    . OFLOXACIN 0.3 % OP SOLN Ophthalmic Apply 1 drop to eye See admin instructions. Instill 1 drop 2 times daily into left eye day of procedure.  Instill 1 drop in left eye 3 times daily after procedure.    Marland Kitchen OVER THE COUNTER MEDICATION Other 1 application by Other route once a week. Friday.  Wash perineum with  5ml vinegar in 8oz water weekly.    Marland Kitchen SIMETHICONE PO Oral Take by mouth See admin instructions. Take 1/2 tablet and chew every 6 hours as needed for gas.  May give up to 4 tabs/24hr period.      BP  144/83  Pulse 76  Temp 98.1 F (36.7 C) (Oral)  Resp 16  Physical Exam General: Well-developed, well-nourished female in no acute distress; appearance consistent with age of record HENT: normocephalic, ecchymosis to right temple Eyes: pupils equal round and reactive to light;  Poor visual acuity Neck: Immobilized in cervical collar; tender to palpation; trachea midline Heart: regular rate and rhythm; 3/6 systolic murmur at the right upper sternal border Lungs: clear to auscultation bilaterally Chest: Nontender Abdomen: soft; nondistended; nontender; bowel sounds present Back: Nontender; lower back pain on attempts to sit upright Extremities: No deformity; normal range of motion for age; mild pain on flexion of knees Neurologic: Awake, alert; motor function intact in all extremities and symmetric; no facial droop Skin: Warm and dry     ED Course  Procedures (including critical care time)     MDM   Nursing notes and vitals signs, including pulse oximetry, reviewed.  Summary of this visit's results, reviewed by myself:  Imaging Studies: Dg Lumbar Spine Complete  08/13/2011  *RADIOLOGY REPORT*  Clinical Data: Low back pain and left pelvic pain after fall.  LUMBAR SPINE - COMPLETE 4+ VIEW  Comparison: 12/12/2009  Findings: Diffuse bone demineralization.  Loss of distinction of the sacral iliac joint suggesting sacroiliitis.  The postoperative changes of posterior rod and screw fixation of L3-L5.  Degenerative changes in the lumbar spine.  No vertebral compression deformities. Stable appearance of the lumbar spine and postoperative changes since the previous study.  Vascular calcifications.  Incidental note of ballooning of interspaces at T11-12 and T12-L1, likely due to osteoporosis.  This is stable.  IMPRESSION: Degenerative and postoperative changes in the lumbar spine. Diffuse bone demineralization.  Probable sacral ileitis.  No displaced fractures appreciated.  Original Report  Authenticated By: Marlon Pel, M.D.   Dg Pelvis 1-2 Views  08/13/2011  *RADIOLOGY REPORT*  Clinical Data: Left-sided pelvic pain after fall.  PELVIS - 1-2 VIEW  Comparison: CT abdomen pelvis 01/08/2011  Findings: The postoperative changes in the lower lumbar spine. Diffuse bone demineralization.  Obscuration of sacroiliac joints suggesting sacroiliitis.  Visualized sacral struts appear intact. The pelvis appears intact without displacement.  Symphysis pubis is not displaced.  Visualized hips appear intact and symmetrical. Benign-appearing sclerosis in the proximal left femoral shaft consistent with enchondroma.  IMPRESSION: Diffuse bone demineralization.  Probable sacral ileitis. Enchondroma in the proximal femur on the left.  No displaced fractures identified.  Original Report Authenticated By: Marlon Pel, M.D.   Ct Head Wo Contrast  08/13/2011  *RADIOLOGY REPORT*  Clinical Data:  78 year old female status post fall.  Hematoma. Pain.  CT HEAD WITHOUT CONTRAST CT CERVICAL SPINE WITHOUT CONTRAST  Technique:  Multidetector CT imaging of the head and cervical spine was performed following the standard protocol without intravenous contrast.  Multiplanar CT image reconstructions of the cervical spine were also generated.  Comparison:   None  CT HEAD  Findings: No acute orbit soft tissue findings.  Small right lateral/supraorbital scalp contusion or hematoma.  Underlying calvarium intact. Visualized paranasal sinuses and mastoids are clear.  Osteopenia.  Calcified atherosclerosis at the skull base.  No midline shift, mass effect, or evidence of mass lesion.  No ventriculomegaly. No acute intracranial hemorrhage identified.  Patchy cerebral white matter hypodensity.  Generalized volume loss. No evidence of cortically based acute infarction identified.  No suspicious intracranial vascular hyperdensity.  IMPRESSION: 1.  Mild scalp soft tissue injury without underlying fracture. 2.  No acute traumatic  injury to the brain. 3.  Cervical findings are below.  CT CERVICAL SPINE  Findings: Osteopenia. Visualized skull base is intact.  No atlanto- occipital dissociation.  Cervicothoracic junction alignment is within normal limits.  Bilateral posterior element alignment is within normal limits.  No acute cervical fracture identified. Diffuse cervical disc and facet degeneration.  Scarring in the lung apices.  Small hypodense nodule in the right thyroid.  Calcified atherosclerosis.  IMPRESSION: No acute fracture or listhesis identified in the cervical spine. Ligamentous injury is not excluded.  Original Report Authenticated By: Harley Hallmark, M.D.   Ct Cervical Spine Wo Contrast  08/13/2011  *RADIOLOGY REPORT*  Clinical Data:  78 year old female status post fall.  Hematoma. Pain.  CT HEAD WITHOUT CONTRAST CT CERVICAL SPINE WITHOUT CONTRAST  Technique:  Multidetector CT imaging of the head and cervical spine was performed following the standard protocol without intravenous contrast.  Multiplanar CT image reconstructions of the cervical spine were also generated.  Comparison:   None  CT HEAD  Findings: No acute orbit soft tissue findings.  Small right lateral/supraorbital scalp contusion or hematoma.  Underlying calvarium intact. Visualized paranasal sinuses and mastoids are clear.  Osteopenia.  Calcified atherosclerosis at the skull base.  No midline shift, mass effect, or evidence of mass lesion.  No ventriculomegaly. No acute intracranial hemorrhage identified.  Patchy cerebral white matter hypodensity.  Generalized volume loss. No evidence of cortically based acute infarction identified.  No suspicious intracranial vascular hyperdensity.  IMPRESSION: 1.  Mild scalp soft tissue injury without underlying fracture. 2.  No acute traumatic injury to the brain. 3.  Cervical findings are below.  CT CERVICAL SPINE  Findings: Osteopenia. Visualized skull base is intact.  No atlanto- occipital dissociation.  Cervicothoracic  junction alignment is within normal limits.  Bilateral posterior element alignment is within normal limits.  No acute cervical fracture identified. Diffuse cervical disc and facet degeneration.  Scarring in the lung apices.  Small hypodense nodule in the right thyroid.  Calcified atherosclerosis.  IMPRESSION: No acute fracture or listhesis identified in the cervical spine. Ligamentous injury is not excluded.  Original Report Authenticated By: Harley Hallmark, M.D.   2:33 AM They should able to ambulate with assistance. She is having pain but does not wish to be admitted to the hospital. She prefers to go home at this time. She  does have tramadol as needed ordered at home.          Hanley Seamen, MD 08/13/11 [phone]

## 2011-08-14 ENCOUNTER — Emergency Department (HOSPITAL_COMMUNITY): Payer: Medicare Other

## 2011-08-14 ENCOUNTER — Emergency Department (HOSPITAL_COMMUNITY)
Admission: EM | Admit: 2011-08-14 | Discharge: 2011-08-15 | Disposition: A | Discharge status: Skilled Nursing Facility | Payer: Medicare Other | Attending: Emergency Medicine | Admitting: Emergency Medicine

## 2011-08-14 DIAGNOSIS — M545 Low back pain, unspecified: Secondary | ICD-10-CM | POA: Insufficient documentation

## 2011-08-14 DIAGNOSIS — S0003XA Contusion of scalp, initial encounter: Secondary | ICD-10-CM | POA: Insufficient documentation

## 2011-08-14 DIAGNOSIS — M5030 Other cervical disc degeneration, unspecified cervical region: Secondary | ICD-10-CM | POA: Insufficient documentation

## 2011-08-14 DIAGNOSIS — R231 Pallor: Secondary | ICD-10-CM | POA: Insufficient documentation

## 2011-08-14 DIAGNOSIS — I10 Essential (primary) hypertension: Secondary | ICD-10-CM | POA: Insufficient documentation

## 2011-08-14 DIAGNOSIS — S1093XA Contusion of unspecified part of neck, initial encounter: Secondary | ICD-10-CM | POA: Insufficient documentation

## 2011-08-14 DIAGNOSIS — F29 Unspecified psychosis not due to a substance or known physiological condition: Secondary | ICD-10-CM | POA: Insufficient documentation

## 2011-08-14 DIAGNOSIS — G309 Alzheimer's disease, unspecified: Secondary | ICD-10-CM | POA: Insufficient documentation

## 2011-08-14 DIAGNOSIS — W19XXXA Unspecified fall, initial encounter: Secondary | ICD-10-CM

## 2011-08-14 DIAGNOSIS — W010XXA Fall on same level from slipping, tripping and stumbling without subsequent striking against object, initial encounter: Secondary | ICD-10-CM | POA: Insufficient documentation

## 2011-08-14 DIAGNOSIS — F0280 Dementia in other diseases classified elsewhere without behavioral disturbance: Secondary | ICD-10-CM | POA: Insufficient documentation

## 2011-08-14 LAB — POCT I-STAT, CHEM 8
BUN: 20 mg/dL (ref 6–23)
Calcium, Ion: 1.09 mmol/L — ABNORMAL LOW (ref 1.12–1.32)
Chloride: 101 mEq/L (ref 96–112)
Creatinine, Ser: 1.1 mg/dL (ref 0.50–1.10)
Glucose, Bld: 125 mg/dL — ABNORMAL HIGH (ref 70–99)
HCT: 41 % (ref 36.0–46.0)
Hemoglobin: 13.9 g/dL (ref 12.0–15.0)
Potassium: 3.4 mEq/L — ABNORMAL LOW (ref 3.5–5.1)
Sodium: 138 mEq/L (ref 135–145)
TCO2: 24 mmol/L (ref 0–100)

## 2011-08-14 LAB — CBC WITH DIFFERENTIAL/PLATELET
Basophils Absolute: 0.1 10*3/uL (ref 0.0–0.1)
Basophils Relative: 1 % (ref 0–1)
Eosinophils Absolute: 0.2 10*3/uL (ref 0.0–0.7)
Eosinophils Relative: 2 % (ref 0–5)
Lymphs Abs: 0.8 10*3/uL (ref 0.7–4.0)
MCH: 30.4 pg (ref 26.0–34.0)
MCHC: 33.8 g/dL (ref 30.0–36.0)
MCV: 90.1 fL (ref 78.0–100.0)
Neutrophils Relative %: 84 % — ABNORMAL HIGH (ref 43–77)
Platelets: 219 10*3/uL (ref 150–400)
RBC: 4.34 MIL/uL (ref 3.87–5.11)
RDW: 14.9 % (ref 11.5–15.5)

## 2011-08-14 LAB — URINALYSIS, ROUTINE W REFLEX MICROSCOPIC
Bilirubin Urine: NEGATIVE
Nitrite: NEGATIVE
Protein, ur: NEGATIVE mg/dL
Specific Gravity, Urine: 1.017 (ref 1.005–1.030)
Urobilinogen, UA: 0.2 mg/dL (ref 0.0–1.0)

## 2011-08-14 MED ORDER — ONDANSETRON HCL 4 MG/2ML IJ SOLN
INTRAMUSCULAR | Status: AC
  Administered 2011-08-15: 02:00:00
  Filled 2011-08-14: qty 2

## 2011-08-14 MED ORDER — SODIUM CHLORIDE 0.9 % IV BOLUS (SEPSIS)
250.0000 mL | Freq: Once | INTRAVENOUS | Status: AC
  Administered 2011-08-14: 250 mL via INTRAVENOUS

## 2011-08-14 NOTE — ED Notes (Signed)
Per EMS patient o2 st was 80 % on arrival to seen, patient did no c/o of SOB- initated 6l o2 immediate went up to 99% reduced floe- 2l and patient maintained .- iv placed-  20g L A/C - 4 mg zofran  12 lead ekg- sr no ectopy

## 2011-08-14 NOTE — ED Notes (Signed)
ZOX:[id number]<AV> Expected date:08/14/11<BR> Expected time: 8:39 PM<BR> Means of arrival:Ambulance<BR> Comments:<BR> Rm. 21 on arrival. EMS 231 GC, 89 yof fall w UTI and emesis 128/ 74, 80

## 2011-08-14 NOTE — ED Notes (Signed)
Ems, found patient on floor- had fallen against doorway, odd postioning of head--90 degree to neck, patein et sat up, became nauseate and dizzy. immobilized her . Place in EMS truck then vomited . HX dementia / alzeheimers, alert and orient x2 (conscious) aware of situation . Unsure of what cause fall, unsure of age, - . Pain in upper part of back. - patient just recently fell last Thursday- no fx noted- pain has been since fall. No noted bruises for this current fall. Being treated for UTI- unable to keep oral antibiotics down. Staff feels that she need iv antibiotic-

## 2011-08-14 NOTE — ED Notes (Signed)
Taking patient a warm blanket

## 2011-08-15 NOTE — Discharge Instructions (Signed)
No fractures, dislocations seen on today's xray's

## 2011-08-15 NOTE — ED Provider Notes (Signed)
History     CSN: [id number]  Arrival date & time 08/14/11  2100   First MD Initiated Contact with Patient 08/14/11 2107      Chief Complaint  Patient presents with  . Fall    vomitting --- with dizziness,   . Urinary Tract Infection    (Consider location/radiation/quality/duration/timing/severity/associated sxs/prior treatment) HPI Comments: Patient states she tripped with her walker falling to the floor against the door frame hitting  her head. Now with low back pain since being placed on backboard.  No reported LOC but is slightly more confused per family tham normal.  They would like her urine checked as this has been the source of increased confusion in the past   Recently treated for UTI 07/29/11  Patient is a 78 y.o. female presenting with fall and urinary tract infection. The history is provided by a relative and the EMS personnel.  Fall The accident occurred less than 1 hour ago. The fall occurred while walking. She fell from a height of 3 to 5 ft. She landed on a hard floor. The point of impact was the head. The pain is at a severity of 6/10. The pain is moderate. She was not ambulatory at the scene. There was no drug use involved in the accident. Pertinent negatives include no fever and no headaches.  Urinary Tract Infection Pertinent negatives include no chills, fever, headaches, joint swelling, rash or weakness.    Past Medical History  Diagnosis Date  . Hypertension   . Arthritis   . Depression   . Allergic rhinitis due to other allergen   . Other and unspecified hyperlipidemia   . Dementia, unspecified, without behavioral disturbance   . Alzheimer's disease   . Unspecified arthropathy, shoulder region   . Anorexia   . Undiagnosed cardiac murmurs   . GERD (gastroesophageal reflux disease)   . Chronic constipation     Past Surgical History  Procedure Date  . Back surgery   . Tonsillectomy     No family history on file.  History  Substance Use Topics  .  Smoking status: Former Games developer  . Smokeless tobacco: Not on file  . Alcohol Use: No    OB History    Grav Para Term Preterm Abortions TAB SAB Ect Mult Living                  Review of Systems  Constitutional: Negative for fever and chills.  Musculoskeletal: Positive for back pain. Negative for joint swelling.  Skin: Negative for pallor, rash and wound.  Neurological: Negative for dizziness, weakness and headaches.  Psychiatric/Behavioral: Positive for confusion.    Allergies  Review of patient's allergies indicates no known allergies.  Home Medications   Current Outpatient Rx  Name Route Sig Dispense Refill  . ALPRAZOLAM 0.25 MG PO TABS Oral Take 0.25 mg by mouth 2 (two) times daily as needed. Sleep/anxiety.    . ATORVASTATIN CALCIUM 20 MG PO TABS Oral Take 20 mg by mouth daily.      Marland Kitchen CIPROFLOXACIN HCL 500 MG PO TABS Oral Take 500 mg by mouth 2 (two) times daily.    Marland Kitchen CRANBERRY EXTRACT PO Oral Take 475 mg by mouth 2 (two) times daily. At 8am and 8pm. Tablet.    Marland Kitchen DICLOFENAC SODIUM 1 % TD GEL Topical Apply 1 application topically 4 (four) times daily as needed. Pain(Site not specified.)  May keep in possession and self-administer.    Marland Kitchen ESOMEPRAZOLE MAGNESIUM 40 MG PO CPDR Oral  Take 40 mg by mouth daily before breakfast.      . HYDROCHLOROTHIAZIDE 12.5 MG PO CAPS Oral Take 12.5 mg by mouth daily.    Marland Kitchen LORATADINE 10 MG PO TABS Oral Take 10 mg by mouth daily.    Marland Kitchen MELATONIN 3 MG PO TABS Oral Take 1 tablet by mouth at bedtime.    Marland Kitchen METOPROLOL TARTRATE 25 MG PO TABS Oral Take 12.5 mg by mouth 2 (two) times daily.     Marland Kitchen OVER THE COUNTER MEDICATION Other 1 application by Other route once a week. Friday.  Wash perineum with  5ml vinegar in 8oz water weekly.    Marland Kitchen PROMETHAZINE HCL 12.5 MG PO TABS Oral Take 12.5 mg by mouth every 6 (six) hours as needed. Nausea.    . SENNA 8.6 MG PO TABS Oral Take 1 tablet by mouth 2 (two) times daily. At 8am and 5pm.    . SERTRALINE HCL 50 MG PO TABS  Oral Take 50 mg by mouth daily.    Marland Kitchen SIMETHICONE 80 MG PO CHEW Oral Chew 80 mg by mouth every 6 (six) hours as needed. gas    . TRAMADOL HCL 50 MG PO TABS Oral Take 100 mg by mouth 2 (two) times daily. Knee pain      BP 97/75  Pulse 81  Temp 98 F (36.7 C) (Oral)  Resp 18  SpO2 97%  Physical Exam  Constitutional: She appears well-developed and well-nourished.  HENT:  Head: Normocephalic.  Eyes: Pupils are equal, round, and reactive to light.  Neck: Normal range of motion.  Cardiovascular: Normal rate.   Pulmonary/Chest: Effort normal.  Abdominal: Soft.  Musculoskeletal: Normal range of motion.  Neurological: She is alert.       Baseline confusion   Skin: Skin is warm and dry. There is pallor.    ED Course  Procedures (including critical care time)  Labs Reviewed  CBC WITH DIFFERENTIAL - Abnormal; Notable for the following:    WBC 10.9 (*)     Neutrophils Relative 84 (*)     Neutro Abs 9.1 (*)     Lymphocytes Relative 8 (*)     All other components within normal limits  POCT I-STAT, CHEM 8 - Abnormal; Notable for the following:    Potassium 3.4 (*)     Glucose, Bld 125 (*)     Calcium, Ion 1.09 (*)     All other components within normal limits  URINALYSIS, ROUTINE W REFLEX MICROSCOPIC   Dg Lumbar Spine Complete  08/13/2011  *RADIOLOGY REPORT*  Clinical Data: Low back pain and left pelvic pain after fall.  LUMBAR SPINE - COMPLETE 4+ VIEW  Comparison: 12/12/2009  Findings: Diffuse bone demineralization.  Loss of distinction of the sacral iliac joint suggesting sacroiliitis.  The postoperative changes of posterior rod and screw fixation of L3-L5.  Degenerative changes in the lumbar spine.  No vertebral compression deformities. Stable appearance of the lumbar spine and postoperative changes since the previous study.  Vascular calcifications.  Incidental note of ballooning of interspaces at T11-12 and T12-L1, likely due to osteoporosis.  This is stable.  IMPRESSION:  Degenerative and postoperative changes in the lumbar spine. Diffuse bone demineralization.  Probable sacral ileitis.  No displaced fractures appreciated.  Original Report Authenticated By: Marlon Pel, M.D.   Dg Pelvis 1-2 Views  08/14/2011  *RADIOLOGY REPORT*  Clinical Data: Fall  PELVIS - 1-2 VIEW  Comparison: 08/13/2011  Findings: Prior lower lumbar fusion. Osseous demineralization. Irregular sclerosis proximal  left femur compatible with enchondroma. Symmetric mild bilateral hip joint space narrowing. Ankylosis of SI joints. No acute fracture, dislocation, or bone destruction.  IMPRESSION: No acute abnormalities as above.  Original Report Authenticated By: Lollie Marrow, M.D.   Dg Pelvis 1-2 Views  08/13/2011  *RADIOLOGY REPORT*  Clinical Data: Left-sided pelvic pain after fall.  PELVIS - 1-2 VIEW  Comparison: CT abdomen pelvis 01/08/2011  Findings: The postoperative changes in the lower lumbar spine. Diffuse bone demineralization.  Obscuration of sacroiliac joints suggesting sacroiliitis.  Visualized sacral struts appear intact. The pelvis appears intact without displacement.  Symphysis pubis is not displaced.  Visualized hips appear intact and symmetrical. Benign-appearing sclerosis in the proximal left femoral shaft consistent with enchondroma.  IMPRESSION: Diffuse bone demineralization.  Probable sacral ileitis. Enchondroma in the proximal femur on the left.  No displaced fractures identified.  Original Report Authenticated By: Marlon Pel, M.D.   Ct Head Wo Contrast  08/14/2011  *RADIOLOGY REPORT*  Clinical Data: Fall, altered  CT HEAD WITHOUT CONTRAST,CT CERVICAL SPINE WITHOUT CONTRAST  Technique:  Contiguous axial images were obtained from the base of the skull through the vertex without contrast.,Technique: Multidetector CT imaging of the cervical spine was performed. Multiplanar CT image reconstructions were also generated.  Comparison: 08/13/2011  Findings:  Head: Prominence of  the sulci, cisterns, and ventricles, in keeping with volume loss. There are subcortical and periventricular white matter hypodensities, a nonspecific finding most often seen with chronic microangiopathic changes. Mild swelling lateral to the right orbit is similar to yesterday's examination.  There is no evidence for acute hemorrhage, overt hydrocephalus, mass lesion, or abnormal extra-axial fluid collection.  No definite CT evidence for acute cortical based (large artery) infarction. Atherosclerotic vascular calcifications.  Diffuse osteopenia. The visualized paranasal sinuses and mastoid air cells are predominately clear.  No displaced calvarial fracture.  Cervical spine:  Osteopenia and advanced multilevel degenerative changes.  Biapical scarring.  Maintained craniocervical relationship.  No dens fracture.  Mild rightward curvature. Multilevel endplate irregularity is similar to prior.  No displaced acute fracture or dislocation.  IMPRESSION: White matter changes and volume loss.  No definite acute intracranial abnormality.  Multilevel degenerative changes of the cervical spine.  No acute fracture or dislocation identified.  Original Report Authenticated By: Waneta Martins, M.D.   Ct Head Wo Contrast  08/13/2011  *RADIOLOGY REPORT*  Clinical Data:  78 year old female status post fall.  Hematoma. Pain.  CT HEAD WITHOUT CONTRAST CT CERVICAL SPINE WITHOUT CONTRAST  Technique:  Multidetector CT imaging of the head and cervical spine was performed following the standard protocol without intravenous contrast.  Multiplanar CT image reconstructions of the cervical spine were also generated.  Comparison:   None  CT HEAD  Findings: No acute orbit soft tissue findings.  Small right lateral/supraorbital scalp contusion or hematoma.  Underlying calvarium intact. Visualized paranasal sinuses and mastoids are clear.  Osteopenia.  Calcified atherosclerosis at the skull base.  No midline shift, mass effect, or evidence of  mass lesion.  No ventriculomegaly. No acute intracranial hemorrhage identified.  Patchy cerebral white matter hypodensity.  Generalized volume loss. No evidence of cortically based acute infarction identified.  No suspicious intracranial vascular hyperdensity.  IMPRESSION: 1.  Mild scalp soft tissue injury without underlying fracture. 2.  No acute traumatic injury to the brain. 3.  Cervical findings are below.  CT CERVICAL SPINE  Findings: Osteopenia. Visualized skull base is intact.  No atlanto- occipital dissociation.  Cervicothoracic junction alignment is within normal limits.  Bilateral posterior element alignment is within normal limits.  No acute cervical fracture identified. Diffuse cervical disc and facet degeneration.  Scarring in the lung apices.  Small hypodense nodule in the right thyroid.  Calcified atherosclerosis.  IMPRESSION: No acute fracture or listhesis identified in the cervical spine. Ligamentous injury is not excluded.  Original Report Authenticated By: Harley Hallmark, M.D.   Ct Cervical Spine Wo Contrast  08/14/2011  *RADIOLOGY REPORT*  Clinical Data: Fall, altered  CT HEAD WITHOUT CONTRAST,CT CERVICAL SPINE WITHOUT CONTRAST  Technique:  Contiguous axial images were obtained from the base of the skull through the vertex without contrast.,Technique: Multidetector CT imaging of the cervical spine was performed. Multiplanar CT image reconstructions were also generated.  Comparison: 08/13/2011  Findings:  Head: Prominence of the sulci, cisterns, and ventricles, in keeping with volume loss. There are subcortical and periventricular white matter hypodensities, a nonspecific finding most often seen with chronic microangiopathic changes. Mild swelling lateral to the right orbit is similar to yesterday's examination.  There is no evidence for acute hemorrhage, overt hydrocephalus, mass lesion, or abnormal extra-axial fluid collection.  No definite CT evidence for acute cortical based (large artery)  infarction. Atherosclerotic vascular calcifications.  Diffuse osteopenia. The visualized paranasal sinuses and mastoid air cells are predominately clear.  No displaced calvarial fracture.  Cervical spine:  Osteopenia and advanced multilevel degenerative changes.  Biapical scarring.  Maintained craniocervical relationship.  No dens fracture.  Mild rightward curvature. Multilevel endplate irregularity is similar to prior.  No displaced acute fracture or dislocation.  IMPRESSION: White matter changes and volume loss.  No definite acute intracranial abnormality.  Multilevel degenerative changes of the cervical spine.  No acute fracture or dislocation identified.  Original Report Authenticated By: Waneta Martins, M.D.   Ct Cervical Spine Wo Contrast  08/13/2011  *RADIOLOGY REPORT*  Clinical Data:  78 year old female status post fall.  Hematoma. Pain.  CT HEAD WITHOUT CONTRAST CT CERVICAL SPINE WITHOUT CONTRAST  Technique:  Multidetector CT imaging of the head and cervical spine was performed following the standard protocol without intravenous contrast.  Multiplanar CT image reconstructions of the cervical spine were also generated.  Comparison:   None  CT HEAD  Findings: No acute orbit soft tissue findings.  Small right lateral/supraorbital scalp contusion or hematoma.  Underlying calvarium intact. Visualized paranasal sinuses and mastoids are clear.  Osteopenia.  Calcified atherosclerosis at the skull base.  No midline shift, mass effect, or evidence of mass lesion.  No ventriculomegaly. No acute intracranial hemorrhage identified.  Patchy cerebral white matter hypodensity.  Generalized volume loss. No evidence of cortically based acute infarction identified.  No suspicious intracranial vascular hyperdensity.  IMPRESSION: 1.  Mild scalp soft tissue injury without underlying fracture. 2.  No acute traumatic injury to the brain. 3.  Cervical findings are below.  CT CERVICAL SPINE  Findings: Osteopenia. Visualized  skull base is intact.  No atlanto- occipital dissociation.  Cervicothoracic junction alignment is within normal limits.  Bilateral posterior element alignment is within normal limits.  No acute cervical fracture identified. Diffuse cervical disc and facet degeneration.  Scarring in the lung apices.  Small hypodense nodule in the right thyroid.  Calcified atherosclerosis.  IMPRESSION: No acute fracture or listhesis identified in the cervical spine. Ligamentous injury is not excluded.  Original Report Authenticated By: Harley Hallmark, M.D.     1. Fall       MDM  Will xray to evalaute head, c spine and pelvis  Arman Filter, NP 08/15/11 0035  Arman Filter, NP 08/15/11 [phone]

## 2011-08-16 ENCOUNTER — Encounter (HOSPITAL_COMMUNITY): Payer: Self-pay | Admitting: *Deleted

## 2011-08-16 ENCOUNTER — Observation Stay (HOSPITAL_COMMUNITY)
Admission: EM | Admit: 2011-08-16 | Discharge: 2011-08-20 | Disposition: A | Discharge status: Skilled Nursing Facility | Payer: Medicare Other | Attending: Internal Medicine | Admitting: Internal Medicine

## 2011-08-16 ENCOUNTER — Emergency Department (HOSPITAL_COMMUNITY): Payer: Medicare Other

## 2011-08-16 DIAGNOSIS — E785 Hyperlipidemia, unspecified: Secondary | ICD-10-CM | POA: Insufficient documentation

## 2011-08-16 DIAGNOSIS — E860 Dehydration: Secondary | ICD-10-CM

## 2011-08-16 DIAGNOSIS — S22009A Unspecified fracture of unspecified thoracic vertebra, initial encounter for closed fracture: Principal | ICD-10-CM | POA: Insufficient documentation

## 2011-08-16 DIAGNOSIS — S22000A Wedge compression fracture of unspecified thoracic vertebra, initial encounter for closed fracture: Secondary | ICD-10-CM

## 2011-08-16 DIAGNOSIS — Z9181 History of falling: Secondary | ICD-10-CM | POA: Insufficient documentation

## 2011-08-16 DIAGNOSIS — R296 Repeated falls: Secondary | ICD-10-CM

## 2011-08-16 DIAGNOSIS — M545 Low back pain, unspecified: Secondary | ICD-10-CM | POA: Insufficient documentation

## 2011-08-16 DIAGNOSIS — R5381 Other malaise: Secondary | ICD-10-CM | POA: Insufficient documentation

## 2011-08-16 DIAGNOSIS — R109 Unspecified abdominal pain: Secondary | ICD-10-CM | POA: Insufficient documentation

## 2011-08-16 DIAGNOSIS — R112 Nausea with vomiting, unspecified: Secondary | ICD-10-CM | POA: Diagnosis present

## 2011-08-16 DIAGNOSIS — S22080A Wedge compression fracture of T11-T12 vertebra, initial encounter for closed fracture: Secondary | ICD-10-CM

## 2011-08-16 DIAGNOSIS — F419 Anxiety disorder, unspecified: Secondary | ICD-10-CM

## 2011-08-16 DIAGNOSIS — E876 Hypokalemia: Secondary | ICD-10-CM | POA: Insufficient documentation

## 2011-08-16 DIAGNOSIS — M549 Dorsalgia, unspecified: Secondary | ICD-10-CM

## 2011-08-16 DIAGNOSIS — Z79899 Other long term (current) drug therapy: Secondary | ICD-10-CM | POA: Insufficient documentation

## 2011-08-16 DIAGNOSIS — W19XXXA Unspecified fall, initial encounter: Secondary | ICD-10-CM | POA: Insufficient documentation

## 2011-08-16 DIAGNOSIS — F0280 Dementia in other diseases classified elsewhere without behavioral disturbance: Secondary | ICD-10-CM | POA: Insufficient documentation

## 2011-08-16 DIAGNOSIS — K219 Gastro-esophageal reflux disease without esophagitis: Secondary | ICD-10-CM | POA: Insufficient documentation

## 2011-08-16 DIAGNOSIS — I10 Essential (primary) hypertension: Secondary | ICD-10-CM | POA: Insufficient documentation

## 2011-08-16 DIAGNOSIS — R531 Weakness: Secondary | ICD-10-CM | POA: Diagnosis present

## 2011-08-16 DIAGNOSIS — G309 Alzheimer's disease, unspecified: Secondary | ICD-10-CM | POA: Insufficient documentation

## 2011-08-16 DIAGNOSIS — F329 Major depressive disorder, single episode, unspecified: Secondary | ICD-10-CM

## 2011-08-16 LAB — URINALYSIS, ROUTINE W REFLEX MICROSCOPIC
Hgb urine dipstick: NEGATIVE
Nitrite: NEGATIVE
Protein, ur: 30 mg/dL — AB
Specific Gravity, Urine: 1.029 (ref 1.005–1.030)
Urobilinogen, UA: 0.2 mg/dL (ref 0.0–1.0)

## 2011-08-16 LAB — URINE MICROSCOPIC-ADD ON

## 2011-08-16 LAB — CBC
HCT: 39.6 % (ref 36.0–46.0)
MCH: 30.3 pg (ref 26.0–34.0)
MCV: 88.8 fL (ref 78.0–100.0)
Platelets: 252 10*3/uL (ref 150–400)
RDW: 14.7 % (ref 11.5–15.5)
WBC: 9.4 10*3/uL (ref 4.0–10.5)

## 2011-08-16 LAB — COMPREHENSIVE METABOLIC PANEL
CO2: 27 mEq/L (ref 19–32)
Calcium: 9.1 mg/dL (ref 8.4–10.5)
Creatinine, Ser: 0.94 mg/dL (ref 0.50–1.10)
GFR calc Af Amer: 58 mL/min — ABNORMAL LOW (ref >90)
GFR calc non Af Amer: 50 mL/min — ABNORMAL LOW (ref >90)
Glucose, Bld: 92 mg/dL (ref 70–99)
Total Protein: 7.1 g/dL (ref 6.0–8.3)

## 2011-08-16 LAB — PROTIME-INR
INR: 1.13 (ref 0.00–1.49)
Prothrombin Time: 14.7 seconds (ref 11.6–15.2)

## 2011-08-16 LAB — DIFFERENTIAL
Eosinophils Absolute: 0.3 10*3/uL (ref 0.0–0.7)
Eosinophils Relative: 4 % (ref 0–5)
Lymphocytes Relative: 12 % (ref 12–46)
Lymphs Abs: 1.1 10*3/uL (ref 0.7–4.0)
Monocytes Absolute: 1 10*3/uL (ref 0.1–1.0)

## 2011-08-16 LAB — LIPASE, BLOOD: Lipase: 17 U/L (ref 11–59)

## 2011-08-16 LAB — APTT: aPTT: 36 seconds (ref 24–37)

## 2011-08-16 MED ORDER — IOHEXOL 300 MG/ML  SOLN
80.0000 mL | Freq: Once | INTRAMUSCULAR | Status: AC | PRN
  Administered 2011-08-16: 80 mL via INTRAVENOUS

## 2011-08-16 MED ORDER — MORPHINE SULFATE 4 MG/ML IJ SOLN
2.0000 mg | Freq: Once | INTRAMUSCULAR | Status: AC
  Administered 2011-08-16: 2 mg via INTRAVENOUS
  Filled 2011-08-16: qty 1

## 2011-08-16 MED ORDER — ACETAMINOPHEN 650 MG RE SUPP: 650.0000 mg | Freq: Four times a day (QID) | RECTAL | Status: DC | PRN

## 2011-08-16 MED ORDER — SODIUM CHLORIDE 0.9 % IV SOLN
INTRAVENOUS | Status: DC
  Administered 2011-08-17: 05:00:00 via INTRAVENOUS
  Administered 2011-08-18: 1000 mL via INTRAVENOUS
  Administered 2011-08-19: 21:00:00 via INTRAVENOUS

## 2011-08-16 MED ORDER — ALUM & MAG HYDROXIDE-SIMETH 200-200-20 MG/5ML PO SUSP: 30.0000 mL | Freq: Four times a day (QID) | ORAL | Status: DC | PRN

## 2011-08-16 MED ORDER — OXYCODONE HCL 5 MG PO TABS
5.0000 mg | ORAL_TABLET | ORAL | Status: DC | PRN
  Administered 2011-08-17 (×3): 5 mg via ORAL
  Filled 2011-08-16 (×3): qty 1

## 2011-08-16 MED ORDER — POTASSIUM CHLORIDE 10 MEQ/100ML IV SOLN
10.0000 meq | INTRAVENOUS | Status: AC
  Administered 2011-08-16 – 2011-08-17 (×3): 10 meq via INTRAVENOUS
  Filled 2011-08-16 (×2): qty 100

## 2011-08-16 MED ORDER — ENOXAPARIN SODIUM 40 MG/0.4ML ~~LOC~~ SOLN
40.0000 mg | SUBCUTANEOUS | Status: DC
  Administered 2011-08-17 – 2011-08-20 (×4): 40 mg via SUBCUTANEOUS
  Filled 2011-08-16 (×5): qty 0.4

## 2011-08-16 MED ORDER — SODIUM CHLORIDE 0.9 % IV SOLN
1000.0000 mL | INTRAVENOUS | Status: DC
  Administered 2011-08-16: 1000 mL via INTRAVENOUS

## 2011-08-16 MED ORDER — ACETAMINOPHEN 325 MG PO TABS
650.0000 mg | ORAL_TABLET | Freq: Four times a day (QID) | ORAL | Status: DC | PRN
  Administered 2011-08-17: 650 mg via ORAL
  Filled 2011-08-16: qty 2

## 2011-08-16 MED ORDER — ONDANSETRON HCL 4 MG/2ML IJ SOLN
4.0000 mg | Freq: Four times a day (QID) | INTRAMUSCULAR | Status: DC | PRN
  Administered 2011-08-17: 4 mg via INTRAVENOUS
  Filled 2011-08-16: qty 2

## 2011-08-16 MED ORDER — SODIUM CHLORIDE 0.9 % IV BOLUS (SEPSIS)
500.0000 mL | Freq: Once | INTRAVENOUS | Status: AC
  Administered 2011-08-16: 500 mL via INTRAVENOUS

## 2011-08-16 MED ORDER — ONDANSETRON HCL 4 MG PO TABS: 4.0000 mg | ORAL_TABLET | Freq: Four times a day (QID) | ORAL | Status: DC | PRN

## 2011-08-16 MED ORDER — ONDANSETRON HCL 4 MG/2ML IJ SOLN
4.0000 mg | Freq: Once | INTRAMUSCULAR | Status: AC
  Administered 2011-08-16: 4 mg via INTRAVENOUS
  Filled 2011-08-16: qty 2

## 2011-08-16 MED ORDER — POTASSIUM CHLORIDE CRYS ER 20 MEQ PO TBCR
40.0000 meq | EXTENDED_RELEASE_TABLET | Freq: Once | ORAL | Status: AC
  Administered 2011-08-16: 40 meq via ORAL
  Filled 2011-08-16: qty 2

## 2011-08-16 MED ORDER — HYDROMORPHONE HCL PF 1 MG/ML IJ SOLN: 0.5000 mg | INTRAMUSCULAR | Status: DC | PRN

## 2011-08-16 MED ORDER — POTASSIUM CHLORIDE 10 MEQ/100ML IV SOLN
10.0000 meq | Freq: Once | INTRAVENOUS | Status: AC
  Administered 2011-08-16: 10 meq via INTRAVENOUS
  Filled 2011-08-16: qty 100

## 2011-08-16 MED ORDER — DEXTROSE-NACL 5-0.45 % IV SOLN
INTRAVENOUS | Status: AC
  Administered 2011-08-16: 20:00:00 via INTRAVENOUS

## 2011-08-16 NOTE — ED Notes (Signed)
Per EMS pt is complaining of bil flank pain and bil lower abdominal pain, does have hx of chronic UTI's, was on cipro but was d/c'd d/t a reaction, nrs[facility] facility could not tell EMS what the reaction was. They told ems pt was delusional but pt has been a/o for EMS, pt complains of no pain laying down, complains of pain sitting, not complaining of any n/v/d. 120/60, HR 80, RR 16, oxygen 92%.

## 2011-08-16 NOTE — ED Provider Notes (Signed)
History     CSN: [id number]  Arrival date & time 08/16/11  1416   First MD Initiated Contact with Patient 08/16/11 1554      Chief Complaint  Patient presents with  . Flank Pain  . Abdominal Pain    HPI Pt has had trouble with falls the last week or so.  She was seen previously in the ED and had a negative evaluation for any serious injuries.  Pt lives at an assisted living facility but today she apparently started complaining of abdominal pain.  Family states she had been treated for a uti this past month with cipro but just stopped taking it yesterday.  Pt has been too weak to stand.  She has not been out of the bed today and usually she would be up and walking around.  No fevers.  She has been vomiting with the recent illness but the last time the family is sure she vomited is Saturday.  Her appetite has been decreased.  Per nursing notes: "Per EMS pt is complaining of bil flank pain and bil lower abdominal pain, does have hx of chronic UTI's, was on cipro but was d/c'd d/t a reaction, nrsg facility could not tell EMS what the reaction was. They told ems pt was delusional but pt has been a/o for EMS, pt complains of no pain laying down, complains of pain sitting, not complaining of any n/v/d. 120/60, HR 80, RR 16, oxygen 92%. "  Past Medical History  Diagnosis Date  . Hypertension   . Arthritis   . Depression   . Allergic rhinitis due to other allergen   . Other and unspecified hyperlipidemia   . Dementia, unspecified, without behavioral disturbance   . Alzheimer's disease   . Unspecified arthropathy, shoulder region   . Anorexia   . Undiagnosed cardiac murmurs   . GERD (gastroesophageal reflux disease)   . Chronic constipation     Past Surgical History  Procedure Date  . Back surgery   . Tonsillectomy     No family history on file.  History  Substance Use Topics  . Smoking status: Former Games developer  . Smokeless tobacco: Not on file  . Alcohol Use: No    OB History      Grav Para Term Preterm Abortions TAB SAB Ect Mult Living                  Review of Systems  Musculoskeletal: Positive for back pain.  Neurological: Negative for headaches.  Psychiatric/Behavioral: Positive for confusion.  All other systems reviewed and are negative.    Allergies  Ciprofloxacin  Home Medications   Current Outpatient Rx  Name Route Sig Dispense Refill  . ALPRAZOLAM 0.25 MG PO TABS Oral Take 0.25 mg by mouth 2 (two) times daily as needed. Sleep/anxiety.    . ATORVASTATIN CALCIUM 20 MG PO TABS Oral Take 20 mg by mouth daily.      Marland Kitchen CRANBERRY EXTRACT PO Oral Take 475 mg by mouth 2 (two) times daily. At 8am and 8pm. Tablet.    Marland Kitchen DICLOFENAC SODIUM 1 % TD GEL Topical Apply 1 application topically 4 (four) times daily as needed. Pain(Site not specified.)  May keep in possession and self-administer.    Marland Kitchen ESOMEPRAZOLE MAGNESIUM 40 MG PO CPDR Oral Take 40 mg by mouth daily before breakfast.      . HYDROCHLOROTHIAZIDE 12.5 MG PO CAPS Oral Take 12.5 mg by mouth daily.    Marland Kitchen LORATADINE 10 MG PO TABS  Oral Take 10 mg by mouth daily.    Marland Kitchen MELATONIN 3 MG PO TABS Oral Take 1 tablet by mouth at bedtime.    Marland Kitchen METOPROLOL TARTRATE 25 MG PO TABS Oral Take 12.5 mg by mouth 2 (two) times daily.     Marland Kitchen OVER THE COUNTER MEDICATION Other 1 application by Other route once a week. Friday.  Wash perineum with  5ml vinegar in 8oz water weekly.    Marland Kitchen POLYETHYLENE GLYCOL 3350 PO PACK Oral Take 17 g by mouth daily.    Marland Kitchen PROMETHAZINE HCL 12.5 MG PO TABS Oral Take 12.5 mg by mouth every 6 (six) hours as needed. Nausea.    . SENNA 8.6 MG PO TABS Oral Take 1 tablet by mouth 2 (two) times daily. At 8am and 5pm.    . SERTRALINE HCL 50 MG PO TABS Oral Take 50 mg by mouth daily.    Marland Kitchen SIMETHICONE 80 MG PO CHEW Oral Chew 80 mg by mouth every 6 (six) hours as needed. gas    . TRAMADOL HCL 50 MG PO TABS Oral Take 100 mg by mouth 2 (two) times daily. Knee pain      BP 143/72  Pulse 83  Temp 98.7 F (37.1 C)  (Oral)  Resp 16  SpO2 93%  Physical Exam  Nursing note and vitals reviewed. Constitutional: No distress.  HENT:  Head: Normocephalic and atraumatic.  Right Ear: External ear normal.  Left Ear: External ear normal.  Eyes: Conjunctivae are normal. Right eye exhibits no discharge. Left eye exhibits no discharge. No scleral icterus.  Neck: Neck supple. No tracheal deviation present.  Cardiovascular: Normal rate, regular rhythm and intact distal pulses.   Murmur heard.  Systolic murmur is present  Pulmonary/Chest: Effort normal and breath sounds normal. No stridor. No respiratory distress. She has no wheezes. She has no rales.  Abdominal: Soft. Bowel sounds are normal. She exhibits no distension. There is tenderness. There is no rebound and no guarding.       Mild lower abdomen  Musculoskeletal: She exhibits no edema and no tenderness.       Lumbar back: She exhibits tenderness.  Neurological: She is alert. She has normal strength. No sensory deficit. Cranial nerve deficit:  no gross defecits noted. She exhibits normal muscle tone. She displays no seizure activity. Coordination normal.  Skin: Skin is warm and dry. No rash noted. She is not diaphoretic.  Psychiatric: She has a normal mood and affect.    ED Course  Procedures (including critical care time)  Rate: 84  Rhythm: normal sinus rhythm  QRS Axis: normal  Intervals: normal  ST/T Wave abnormalities: normal  Conduction Disutrbances: First degree av block  Narrative Interpretation: anterior q waves, lvh with repol changes  Old EKG Reviewed: no changes  Labs Reviewed  URINALYSIS, ROUTINE W REFLEX MICROSCOPIC - Abnormal; Notable for the following:    Color, Urine AMBER (*)  BIOCHEMICALS MAY BE AFFECTED BY COLOR   Bilirubin Urine SMALL (*)     Ketones, ur 15 (*)     Protein, ur 30 (*)     Leukocytes, UA TRACE (*)     All other components within normal limits  URINE MICROSCOPIC-ADD ON - Abnormal; Notable for the following:     Squamous Epithelial / LPF FEW (*)     Bacteria, UA FEW (*)     All other components within normal limits   Dg Pelvis 1-2 Views  08/14/2011  *RADIOLOGY REPORT*  Clinical Data: Fall  PELVIS - 1-2 VIEW  Comparison: 08/13/2011  Findings: Prior lower lumbar fusion. Osseous demineralization. Irregular sclerosis proximal left femur compatible with enchondroma. Symmetric mild bilateral hip joint space narrowing. Ankylosis of SI joints. No acute fracture, dislocation, or bone destruction.  IMPRESSION: No acute abnormalities as above.  Original Report Authenticated By: Lollie Marrow, M.D.   Ct Head Wo Contrast  08/14/2011  *RADIOLOGY REPORT*  Clinical Data: Fall, altered  CT HEAD WITHOUT CONTRAST,CT CERVICAL SPINE WITHOUT CONTRAST  Technique:  Contiguous axial images were obtained from the base of the skull through the vertex without contrast.,Technique: Multidetector CT imaging of the cervical spine was performed. Multiplanar CT image reconstructions were also generated.  Comparison: 08/13/2011  Findings:  Head: Prominence of the sulci, cisterns, and ventricles, in keeping with volume loss. There are subcortical and periventricular white matter hypodensities, a nonspecific finding most often seen with chronic microangiopathic changes. Mild swelling lateral to the right orbit is similar to yesterday's examination.  There is no evidence for acute hemorrhage, overt hydrocephalus, mass lesion, or abnormal extra-axial fluid collection.  No definite CT evidence for acute cortical based (large artery) infarction. Atherosclerotic vascular calcifications.  Diffuse osteopenia. The visualized paranasal sinuses and mastoid air cells are predominately clear.  No displaced calvarial fracture.  Cervical spine:  Osteopenia and advanced multilevel degenerative changes.  Biapical scarring.  Maintained craniocervical relationship.  No dens fracture.  Mild rightward curvature. Multilevel endplate irregularity is similar to prior.  No  displaced acute fracture or dislocation.  IMPRESSION: White matter changes and volume loss.  No definite acute intracranial abnormality.  Multilevel degenerative changes of the cervical spine.  No acute fracture or dislocation identified.  Original Report Authenticated By: Waneta Martins, M.D.   Ct Cervical Spine Wo Contrast  08/14/2011  *RADIOLOGY REPORT*  Clinical Data: Fall, altered  CT HEAD WITHOUT CONTRAST,CT CERVICAL SPINE WITHOUT CONTRAST  Technique:  Contiguous axial images were obtained from the base of the skull through the vertex without contrast.,Technique: Multidetector CT imaging of the cervical spine was performed. Multiplanar CT image reconstructions were also generated.  Comparison: 08/13/2011  Findings:  Head: Prominence of the sulci, cisterns, and ventricles, in keeping with volume loss. There are subcortical and periventricular white matter hypodensities, a nonspecific finding most often seen with chronic microangiopathic changes. Mild swelling lateral to the right orbit is similar to yesterday's examination.  There is no evidence for acute hemorrhage, overt hydrocephalus, mass lesion, or abnormal extra-axial fluid collection.  No definite CT evidence for acute cortical based (large artery) infarction. Atherosclerotic vascular calcifications.  Diffuse osteopenia. The visualized paranasal sinuses and mastoid air cells are predominately clear.  No displaced calvarial fracture.  Cervical spine:  Osteopenia and advanced multilevel degenerative changes.  Biapical scarring.  Maintained craniocervical relationship.  No dens fracture.  Mild rightward curvature. Multilevel endplate irregularity is similar to prior.  No displaced acute fracture or dislocation.  IMPRESSION: White matter changes and volume loss.  No definite acute intracranial abnormality.  Multilevel degenerative changes of the cervical spine.  No acute fracture or dislocation identified.  Original Report Authenticated By: Waneta Martins, M.D.     1. Hypokalemia   2. Thoracic compression fracture       MDM  The patient does not appear in any acute abdominal process today. She does have a new thoracic compression fracture that is most likely associated with her recent falls. This is likely contributing to her inability to get out of bed today and her persistent lower back  pain.  IV potassium has been ordered.  The family is also concerned that she may need a higher level of care than her assisted living facility with her recent injury. Patient will be admitted to hospital for further treatment and evaluation        Celene Kras, MD 08/16/11 [phone]

## 2011-08-16 NOTE — H&P (Signed)
DATE OF ADMISSION:  08/16/2011  PCP:    Florentina Jenny, MD   Chief Complaint:  Severe Back Pain   HPI: Angelica Marshall is an 78 y.o. female from the [facility] who was brought to the ED due to complaints of severe back pain and weakness and not being able to get out of bed today.  She has had several falls and 4 days ago when she fell she hit her head and hurt her back.  She fell again 2 days later.  She was seen inn the ED for evaluation after each fall.  She has also had recurring UTIs and has been treated recently.  Per her family she has had some confusion, and nausea and vomiting this past week and they are concerned that she has another UTI.    Past Medical History  Diagnosis Date  . Hypertension   . Arthritis   . Depression   . Allergic rhinitis due to other allergen   . Other and unspecified hyperlipidemia   . Dementia, unspecified, without behavioral disturbance   . Alzheimer's disease   . Unspecified arthropathy, shoulder region   . Anorexia   . Undiagnosed cardiac murmurs   . GERD (gastroesophageal reflux disease)   . Chronic constipation     Past Surgical History  Procedure Date  . Back surgery   . Tonsillectomy     Medications:  HOME MEDS: Prior to Admission medications   Medication Sig Start Date End Date Taking? Authorizing Provider  ALPRAZolam (XANAX) 0.25 MG tablet Take 0.25 mg by mouth 2 (two) times daily as needed. Sleep/anxiety.   Yes Historical Provider, MD  atorvastatin (LIPITOR) 20 MG tablet Take 20 mg by mouth daily.     Yes Historical Provider, MD  CRANBERRY EXTRACT PO Take 475 mg by mouth 2 (two) times daily. At 8am and 8pm. Tablet.   Yes Historical Provider, MD  diclofenac sodium (VOLTAREN) 1 % GEL Apply 1 application topically 4 (four) times daily as needed. Pain(Site not specified.)  May keep in possession and self-administer.   Yes Historical Provider, MD  esomeprazole (NEXIUM) 40 MG capsule Take 40 mg by mouth daily  before breakfast.     Yes Historical Provider, MD  hydrochlorothiazide (MICROZIDE) 12.5 MG capsule Take 12.5 mg by mouth daily.   Yes Historical Provider, MD  loratadine (CLARITIN) 10 MG tablet Take 10 mg by mouth daily.   Yes Historical Provider, MD  Melatonin 3 MG TABS Take 1 tablet by mouth at bedtime.   Yes Historical Provider, MD  metoprolol tartrate (LOPRESSOR) 25 MG tablet Take 12.5 mg by mouth 2 (two) times daily.  01/12/11 01/12/12 Yes Erick Blinks, MD  OVER THE COUNTER MEDICATION 1 application by Other route once a week. Friday.  Wash perineum with  5ml vinegar in 8oz water weekly.   Yes Historical Provider, MD  polyethylene glycol (MIRALAX / GLYCOLAX) packet Take 17 g by mouth daily.   Yes Historical Provider, MD  promethazine (PHENERGAN) 12.5 MG tablet Take 12.5 mg by mouth every 6 (six) hours as needed. Nausea.   Yes Historical Provider, MD  senna (SENOKOT) 8.6 MG TABS Take 1 tablet by mouth 2 (two) times daily. At 8am and 5pm.   Yes Historical Provider, MD  sertraline (ZOLOFT) 50 MG tablet Take 50 mg by mouth daily.   Yes Historical Provider, MD  simethicone (MYLICON) 80 MG chewable tablet Chew 80 mg by mouth every 6 (six) hours as needed. gas   Yes Historical  Provider, MD  traMADol (ULTRAM) 50 MG tablet Take 100 mg by mouth 2 (two) times daily. Knee pain   Yes Historical Provider, MD    Allergies:  Allergies  Allergen Reactions  . Ciprofloxacin     Social History:   reports that she has quit smoking. She does not have any smokeless tobacco history on file. She reports that she does not drink alcohol or use illicit drugs.  Family History:  A Sister and a Brother had Cancer,  Another Brother had Diabetes.    Review of Systems: + for:  Difficulty walking, nausea, vomiting, muscle weakness, ABD Pain, Back pain, confusion, chronic Vision loss with legal blindness, and decreased hearing.     The patient denies anorexia, fever, weight loss, hoarseness, chest pain, syncope,  dyspnea on exertion, peripheral edema, balance deficits, hemoptysis, melena, hematochezia, severe indigestion/heartburn, hematuria, incontinence, genital sores suspicious skin lesions, depression, unusual weight change, abnormal bleeding, enlarged lymph nodes, angioedema, and breast masses.   Physical Exam:  GEN:  Pleasant  78 year old thin frail appearing Elderly Caucasian female examined  and in no acute distress; cooperative with exam Filed Vitals:   08/16/11 1424 08/16/11 1621 08/16/11 1925  BP: 143/72 135/66 130/79  Pulse: 83 77 89  Temp: 98.7 F (37.1 C) 98.2 F (36.8 C) 98.8 F (37.1 C)  TempSrc: Oral Oral Oral  Resp: 16 16 18   SpO2: 93% 91% 96%   Blood pressure 130/79, pulse 89, temperature 98.8 F (37.1 C), temperature source Oral, resp. rate 18, SpO2 96.00%. PSYCH: She is alert and oriented x4; does not appear anxious does not appear depressed; affect is normal HEENT: Normocephalic and Atraumatic, Mucous membranes pink; PERRLA; EOM intact; Fundi: Unable to visualize;  No scleral icterus, Nares: Patent, Oropharynx: Clear, Edentulous, Neck:  FROM, no cervical lymphadenopathy nor thyromegaly or carotid bruit; no JVD; Breasts:: Not examined CHEST WALL: No tenderness CHEST: Normal respiration, clear to auscultation bilaterally HEART: Regular rate and rhythm; no murmurs rubs or gallops BACK: No kyphosis or scoliosis; no CVA tenderness ABDOMEN: Positive Bowel Sounds, soft non-tender; no masses, no organomegaly, Rectal Exam: Not done EXTREMITIES: No bone or joint deformity; age-appropriate arthropathy of the hands and knees; no cyanosis, clubbing or edema; no ulcerations. Genitalia: not examined PULSES: 2+ and symmetric SKIN: Normal hydration no rash or ulceration CNS: Cranial nerves 2-12 grossly intact no focal neurologic deficit   Labs & Imaging Results for orders placed during the hospital encounter of 08/16/11 (from the past 48 hour(s))  URINALYSIS, ROUTINE W REFLEX  MICROSCOPIC     Status: Abnormal   Collection Time   08/16/11  3:00 PM      Component Value Range Comment   Color, Urine AMBER (*) YELLOW BIOCHEMICALS MAY BE AFFECTED BY COLOR   APPearance CLEAR  CLEAR    Specific Gravity, Urine 1.029  1.005 - 1.030    pH 6.0  5.0 - 8.0    Glucose, UA NEGATIVE  NEGATIVE mg/dL    Hgb urine dipstick NEGATIVE  NEGATIVE    Bilirubin Urine SMALL (*) NEGATIVE    Ketones, ur 15 (*) NEGATIVE mg/dL    Protein, ur 30 (*) NEGATIVE mg/dL    Urobilinogen, UA 0.2  0.0 - 1.0 mg/dL    Nitrite NEGATIVE  NEGATIVE    Leukocytes, UA TRACE (*) NEGATIVE   URINE MICROSCOPIC-ADD ON     Status: Abnormal   Collection Time   08/16/11  3:00 PM      Component Value Range Comment  Squamous Epithelial / LPF FEW (*) RARE    WBC, UA 3-6  <3 WBC/hpf    Bacteria, UA FEW (*) RARE    Urine-Other MUCOUS PRESENT     CBC     Status: Normal   Collection Time   08/16/11  4:55 PM      Component Value Range Comment   WBC 9.4  4.0 - 10.5 K/uL    RBC 4.46  3.87 - 5.11 MIL/uL    Hemoglobin 13.5  12.0 - 15.0 g/dL    HCT 16.1  09.6 - 04.5 %    MCV 88.8  78.0 - 100.0 fL    MCH 30.3  26.0 - 34.0 pg    MCHC 34.1  30.0 - 36.0 g/dL    RDW 40.9  81.1 - 91.4 %    Platelets 252  150 - 400 K/uL   DIFFERENTIAL     Status: Normal   Collection Time   08/16/11  4:55 PM      Component Value Range Comment   Neutrophils Relative 74  43 - 77 %    Neutro Abs 6.9  1.7 - 7.7 K/uL    Lymphocytes Relative 12  12 - 46 %    Lymphs Abs 1.1  0.7 - 4.0 K/uL    Monocytes Relative 10  3 - 12 %    Monocytes Absolute 1.0  0.1 - 1.0 K/uL    Eosinophils Relative 4  0 - 5 %    Eosinophils Absolute 0.3  0.0 - 0.7 K/uL    Basophils Relative 1  0 - 1 %    Basophils Absolute 0.1  0.0 - 0.1 K/uL   COMPREHENSIVE METABOLIC PANEL     Status: Abnormal   Collection Time   08/16/11  4:55 PM      Component Value Range Comment   Sodium 138  135 - 145 mEq/L    Potassium 2.8 (*) 3.5 - 5.1 mEq/L DELTA CHECK NOTED   Chloride 99  96 -  112 mEq/L    CO2 27  19 - 32 mEq/L    Glucose, Bld 92  70 - 99 mg/dL    BUN 26 (*) 6 - 23 mg/dL    Creatinine, Ser 7.82  0.50 - 1.10 mg/dL    Calcium 9.1  8.4 - 95.6 mg/dL    Total Protein 7.1  6.0 - 8.3 g/dL    Albumin 3.5  3.5 - 5.2 g/dL    AST 15  0 - 37 U/L    ALT 9  0 - 35 U/L    Alkaline Phosphatase 85  39 - 117 U/L    Total Bilirubin 1.1  0.3 - 1.2 mg/dL    GFR calc non Af Amer 50 (*) >90 mL/min    GFR calc Af Amer 58 (*) >90 mL/min   LIPASE, BLOOD     Status: Normal   Collection Time   08/16/11  4:55 PM      Component Value Range Comment   Lipase 17  11 - 59 U/L   PROTIME-INR     Status: Normal   Collection Time   08/16/11  4:55 PM      Component Value Range Comment   Prothrombin Time 14.7  11.6 - 15.2 seconds    INR 1.13  0.00 - 1.49   APTT     Status: Normal   Collection Time   08/16/11  4:55 PM      Component Value Range Comment  aPTT 36  24 - 37 seconds    Dg Chest 2 View  08/16/2011  *RADIOLOGY REPORT*  Clinical Data: Larey Seat last week, flank and low back pain  CHEST - 2 VIEW  Comparison: Portable chest x-ray of 05/30/2011  Findings: No active infiltrate or effusion is seen.  The heart is mildly enlarged.  No acute rib fracture is evident.  However, on the lateral view there is a partially compressed T12 vertebral body which appears new when compared to sagittal images from CT of 01/08/2011.  No evidence of retropulsion is seen.  The bones are diffusely osteopenic.  IMPRESSION:  1.  No active infiltrate or effusion. 2.  Cardiomegaly. 3.  New partially compressed T12 vertebral body of approximately 40- 50%.  Original Report Authenticated By: Juline Patch, M.D.   Dg Pelvis 1-2 Views  08/14/2011  *RADIOLOGY REPORT*  Clinical Data: Fall  PELVIS - 1-2 VIEW  Comparison: 08/13/2011  Findings: Prior lower lumbar fusion. Osseous demineralization. Irregular sclerosis proximal left femur compatible with enchondroma. Symmetric mild bilateral hip joint space narrowing. Ankylosis of SI  joints. No acute fracture, dislocation, or bone destruction.  IMPRESSION: No acute abnormalities as above.  Original Report Authenticated By: Lollie Marrow, M.D.   Ct Head Wo Contrast  08/14/2011  *RADIOLOGY REPORT*  Clinical Data: Fall, altered  CT HEAD WITHOUT CONTRAST,CT CERVICAL SPINE WITHOUT CONTRAST  Technique:  Contiguous axial images were obtained from the base of the skull through the vertex without contrast.,Technique: Multidetector CT imaging of the cervical spine was performed. Multiplanar CT image reconstructions were also generated.  Comparison: 08/13/2011  Findings:  Head: Prominence of the sulci, cisterns, and ventricles, in keeping with volume loss. There are subcortical and periventricular white matter hypodensities, a nonspecific finding most often seen with chronic microangiopathic changes. Mild swelling lateral to the right orbit is similar to yesterday's examination.  There is no evidence for acute hemorrhage, overt hydrocephalus, mass lesion, or abnormal extra-axial fluid collection.  No definite CT evidence for acute cortical based (large artery) infarction. Atherosclerotic vascular calcifications.  Diffuse osteopenia. The visualized paranasal sinuses and mastoid air cells are predominately clear.  No displaced calvarial fracture.  Cervical spine:  Osteopenia and advanced multilevel degenerative changes.  Biapical scarring.  Maintained craniocervical relationship.  No dens fracture.  Mild rightward curvature. Multilevel endplate irregularity is similar to prior.  No displaced acute fracture or dislocation.  IMPRESSION: White matter changes and volume loss.  No definite acute intracranial abnormality.  Multilevel degenerative changes of the cervical spine.  No acute fracture or dislocation identified.  Original Report Authenticated By: Waneta Martins, M.D.   Ct Cervical Spine Wo Contrast  08/14/2011  *RADIOLOGY REPORT*  Clinical Data: Fall, altered  CT HEAD WITHOUT CONTRAST,CT  CERVICAL SPINE WITHOUT CONTRAST  Technique:  Contiguous axial images were obtained from the base of the skull through the vertex without contrast.,Technique: Multidetector CT imaging of the cervical spine was performed. Multiplanar CT image reconstructions were also generated.  Comparison: 08/13/2011  Findings:  Head: Prominence of the sulci, cisterns, and ventricles, in keeping with volume loss. There are subcortical and periventricular white matter hypodensities, a nonspecific finding most often seen with chronic microangiopathic changes. Mild swelling lateral to the right orbit is similar to yesterday's examination.  There is no evidence for acute hemorrhage, overt hydrocephalus, mass lesion, or abnormal extra-axial fluid collection.  No definite CT evidence for acute cortical based (large artery) infarction. Atherosclerotic vascular calcifications.  Diffuse osteopenia. The visualized paranasal sinuses and mastoid  air cells are predominately clear.  No displaced calvarial fracture.  Cervical spine:  Osteopenia and advanced multilevel degenerative changes.  Biapical scarring.  Maintained craniocervical relationship.  No dens fracture.  Mild rightward curvature. Multilevel endplate irregularity is similar to prior.  No displaced acute fracture or dislocation.  IMPRESSION: White matter changes and volume loss.  No definite acute intracranial abnormality.  Multilevel degenerative changes of the cervical spine.  No acute fracture or dislocation identified.  Original Report Authenticated By: Waneta Martins, M.D.   Ct Abdomen Pelvis W Contrast  08/16/2011  *RADIOLOGY REPORT*  Clinical Data: Bilateral flank pain.  CT ABDOMEN AND PELVIS WITH CONTRAST  Technique:  Multidetector CT imaging of the abdomen and pelvis was performed following the standard protocol during bolus administration of intravenous contrast.  Contrast: 80mL OMNIPAQUE IOHEXOL 300 MG/ML  SOLN  Comparison: CT scan 01/08/2011.  Findings: The lung  bases are clear.  The liver is unremarkable.  No focal lesions or intrahepatic biliary dilatation.  The gallbladder is normal.  No common bile duct dilatation.  The pancreas demonstrates mild to moderate atrophy but no focal lesion or inflammatory change.  A moderate sized duodenal diverticulum is noted near the pancreatic head.  The spleen is normal in size.  No focal lesions.  A few small calcified granulomas are noted.  The adrenal glands and kidneys demonstrate no significant abnormalities.  There are small bilateral parapelvic cysts.  No obstructing ureteral calculi and no bladder calculi.  The stomach, duodenum, small bowel and colon are unremarkable. Mild fibrofatty infiltrative changes involving the distal ileum likely due to previous inflammatory bowel disease.  No findings for active bowel inflammation.  There is moderate stool throughout the colon suggesting constipation.  There is severe diverticulosis of the sigmoid colon without findings for acute diverticulitis.  The aorta demonstrates moderate atherosclerotic changes but no focal aneurysm or dissection.  The major branch vessels are patent. No mesenteric or retroperitoneal masses or adenopathy.  The uterus is surgically absent.  The bladder is distended.  No pelvic mass or free pelvic fluid collections.  No inguinal mass or adenopathy.  The bony structures are intact.  There is severe osteoporosis and there are lumbar fusion changes.  IMPRESSION:  1.  No acute abdominal/pelvic findings, mass lesions or adenopathy. 2.  Moderate stool throughout the colon may suggest constipation. 3.  Mild bladder distention.  Original Report Authenticated By: P. Loralie Champagne, M.D.      Assessment/Plan: Present on Admission:  .Compression fracture of T12 vertebra .Back pain .Hypokalemia .Weakness generalized .Nausea and vomiting .HTN (hypertension) .Hyperlipidemia    Plan:    Admit to Telemetry due to Hypokalemia Replete K+, check Magnesium Pain  Control Send Cath Urine for C+S Antiemetics Consider Options for treatment of Compression Fracture    Seen by Dr. Lovell Sheehan Neurosurgery in past Reconcile Home Meds.   DVT prophylaxis Consider higher acuity placement and or Rehab Placement Other plans as per orders.    CODE STATUS:      DO NOT RESUSCITATE (DNR)     DO NOT INTUBATE (DNI)    Jaree Dwight C 08/16/2011, 7:58 PM

## 2011-08-16 NOTE — ED Notes (Signed)
Patient transported to X-ray 

## 2011-08-16 NOTE — Progress Notes (Signed)
ED CM consulted by EDP, Lynelle Doctor about results of imaging and labs in [facility]

## 2011-08-16 NOTE — ED Notes (Signed)
Pt states she's in the hospital d/t falling the other day and having bil flank pain, pt states pain isn't that bad when laying down but worse when moving. Denies pain w/ urination.

## 2011-08-16 NOTE — ED Notes (Signed)
Patient transported to CT 

## 2011-08-16 NOTE — ED Notes (Signed)
YQM:[id number]<ON> Expected date:<BR> Expected time:<BR> Means of arrival:Ambulance<BR> Comments:<BR> PTAR 89yoF UTI sx

## 2011-08-17 DIAGNOSIS — R5383 Other fatigue: Secondary | ICD-10-CM

## 2011-08-17 LAB — CBC
HCT: 35.7 % — ABNORMAL LOW (ref 36.0–46.0)
Hemoglobin: 11.9 g/dL — ABNORMAL LOW (ref 12.0–15.0)
RBC: 3.99 MIL/uL (ref 3.87–5.11)
WBC: 7.3 10*3/uL (ref 4.0–10.5)

## 2011-08-17 LAB — BASIC METABOLIC PANEL
Chloride: 104 mEq/L (ref 96–112)
GFR calc Af Amer: 71 mL/min — ABNORMAL LOW (ref >90)
GFR calc non Af Amer: 61 mL/min — ABNORMAL LOW (ref >90)
Potassium: 3.6 mEq/L (ref 3.5–5.1)
Sodium: 136 mEq/L (ref 135–145)

## 2011-08-17 MED ORDER — BIOTENE DRY MOUTH MT LIQD
15.0000 mL | Freq: Two times a day (BID) | OROMUCOSAL | Status: DC
  Administered 2011-08-17 – 2011-08-20 (×6): 15 mL via OROMUCOSAL

## 2011-08-17 MED ORDER — ALPRAZOLAM 0.25 MG PO TABS
0.2500 mg | ORAL_TABLET | Freq: Two times a day (BID) | ORAL | Status: DC | PRN
  Administered 2011-08-18 (×2): 0.25 mg via ORAL
  Filled 2011-08-17 (×2): qty 1

## 2011-08-17 MED ORDER — ENSURE PUDDING PO PUDG
1.0000 | Freq: Two times a day (BID) | ORAL | Status: DC
  Administered 2011-08-17 – 2011-08-20 (×5): 1 via ORAL
  Filled 2011-08-17 (×8): qty 1

## 2011-08-17 NOTE — Progress Notes (Signed)
DAILY PROGRESS NOTE                              [facility]AL [facility]  SUBJECTIVE: Feels okay, her son at bedside.  OBJECTIVE: BP 101/57  Pulse 85  Temp 98.7 F (37.1 C) (Oral)  Resp 16  Ht 5\' 5"  (1.651 m)  Wt 57.2 kg (126 lb 1.7 oz)  BMI 20.98 kg/m2  SpO2 94% No intake or output data in the 24 hours ending 08/17/11 1230                    Weight change:  Physical Exam: General: Alert and awake oriented x3 not in any acute distress. HEENT: anicteric sclera, pupils equal reactive to light and accommodation CVS: S1-S2 heard, no murmur rubs or gallops Chest: clear to auscultation bilaterally, no wheezing rales or rhonchi Abdomen:  normal bowel sounds, soft, nontender, nondistended, no organomegaly Neuro: Cranial nerves II-XII intact, no focal neurological deficits Extremities: no cyanosis, no clubbing or edema noted bilaterally   Lab Results:  Basename 08/17/11 0336 08/16/11 1655  NA 136 138  K 3.6 2.8*  CL 104 99  CO2 23 27  GLUCOSE 98 92  BUN 18 26*  CREATININE 0.80 0.94  CALCIUM 8.2* 9.1  MG -- 2.2  PHOS -- --    Basename 08/16/11 1655  AST 15  ALT 9  ALKPHOS 85  BILITOT 1.1  PROT 7.1  ALBUMIN 3.5    Basename 08/16/11 1655  LIPASE 17  AMYLASE --    Basename 08/17/11 0336 08/16/11 1655 08/14/11 2215  WBC 7.3 9.4 --  NEUTROABS -- 6.9 9.1*  HGB 11.9* 13.5 --  HCT 35.7* 39.6 --  MCV 89.5 88.8 --  PLT 222 252 --   No results found for this basename: CKTOTAL:3,CKMB:3,CKMBINDEX:3,TROPONINI:3 in the last 72 hours No components found with this basename: POCBNP:3 No results found for this basename: DDIMER:2 in the last 72 hours No results found for this basename: HGBA1C:2 in the last 72 hours No results found for this basename: CHOL:2,HDL:2,LDLCALC:2,TRIG:2,CHOLHDL:2,LDLDIRECT:2 in the last 72 hours No results found for this basename: TSH,T4TOTAL,FREET3,T3FREE,THYROIDAB in the last 72 hours No results found for this basename:  VITAMINB12:2,FOLATE:2,FERRITIN:2,TIBC:2,IRON:2,RETICCTPCT:2 in the last 72 hours  Micro Results: Recent Results (from the past 240 hour(s))  MRSA PCR SCREENING     Status: Normal   Collection Time   08/17/11  9:04 AM      Component Value Range Status Comment   MRSA by PCR NEGATIVE  NEGATIVE Final     Studies/Results: Dg Chest 2 View  08/16/2011  *RADIOLOGY REPORT*  Clinical Data: Larey Seat last week, flank and low back pain  CHEST - 2 VIEW  Comparison: Portable chest x-ray of 05/30/2011  Findings: No active infiltrate or effusion is seen.  The heart is mildly enlarged.  No acute rib fracture is evident.  However, on the lateral view there is a partially compressed T12 vertebral body which appears new when compared to sagittal images from CT of 01/08/2011.  No evidence of retropulsion is seen.  The bones are diffusely osteopenic.  IMPRESSION:  1.  No active infiltrate or effusion. 2.  Cardiomegaly. 3.  New partially compressed T12 vertebral body of approximately 40- 50%.  Original Report Authenticated By: Juline Patch, M.D.   Dg Lumbar Spine Complete  08/13/2011  *RADIOLOGY REPORT*  Clinical Data: Low back pain and left pelvic pain after fall.  LUMBAR  SPINE - COMPLETE 4+ VIEW  Comparison: 12/12/2009  Findings: Diffuse bone demineralization.  Loss of distinction of the sacral iliac joint suggesting sacroiliitis.  The postoperative changes of posterior rod and screw fixation of L3-L5.  Degenerative changes in the lumbar spine.  No vertebral compression deformities. Stable appearance of the lumbar spine and postoperative changes since the previous study.  Vascular calcifications.  Incidental note of ballooning of interspaces at T11-12 and T12-L1, likely due to osteoporosis.  This is stable.  IMPRESSION: Degenerative and postoperative changes in the lumbar spine. Diffuse bone demineralization.  Probable sacral ileitis.  No displaced fractures appreciated.  Original Report Authenticated By: Marlon Pel,  M.D.   Dg Pelvis 1-2 Views  08/14/2011  *RADIOLOGY REPORT*  Clinical Data: Fall  PELVIS - 1-2 VIEW  Comparison: 08/13/2011  Findings: Prior lower lumbar fusion. Osseous demineralization. Irregular sclerosis proximal left femur compatible with enchondroma. Symmetric mild bilateral hip joint space narrowing. Ankylosis of SI joints. No acute fracture, dislocation, or bone destruction.  IMPRESSION: No acute abnormalities as above.  Original Report Authenticated By: Lollie Marrow, M.D.   Dg Pelvis 1-2 Views  08/13/2011  *RADIOLOGY REPORT*  Clinical Data: Left-sided pelvic pain after fall.  PELVIS - 1-2 VIEW  Comparison: CT abdomen pelvis 01/08/2011  Findings: The postoperative changes in the lower lumbar spine. Diffuse bone demineralization.  Obscuration of sacroiliac joints suggesting sacroiliitis.  Visualized sacral struts appear intact. The pelvis appears intact without displacement.  Symphysis pubis is not displaced.  Visualized hips appear intact and symmetrical. Benign-appearing sclerosis in the proximal left femoral shaft consistent with enchondroma.  IMPRESSION: Diffuse bone demineralization.  Probable sacral ileitis. Enchondroma in the proximal femur on the left.  No displaced fractures identified.  Original Report Authenticated By: Marlon Pel, M.D.   Ct Head Wo Contrast  08/14/2011  *RADIOLOGY REPORT*  Clinical Data: Fall, altered  CT HEAD WITHOUT CONTRAST,CT CERVICAL SPINE WITHOUT CONTRAST  Technique:  Contiguous axial images were obtained from the base of the skull through the vertex without contrast.,Technique: Multidetector CT imaging of the cervical spine was performed. Multiplanar CT image reconstructions were also generated.  Comparison: 08/13/2011  Findings:  Head: Prominence of the sulci, cisterns, and ventricles, in keeping with volume loss. There are subcortical and periventricular white matter hypodensities, a nonspecific finding most often seen with chronic microangiopathic  changes. Mild swelling lateral to the right orbit is similar to yesterday's examination.  There is no evidence for acute hemorrhage, overt hydrocephalus, mass lesion, or abnormal extra-axial fluid collection.  No definite CT evidence for acute cortical based (large artery) infarction. Atherosclerotic vascular calcifications.  Diffuse osteopenia. The visualized paranasal sinuses and mastoid air cells are predominately clear.  No displaced calvarial fracture.  Cervical spine:  Osteopenia and advanced multilevel degenerative changes.  Biapical scarring.  Maintained craniocervical relationship.  No dens fracture.  Mild rightward curvature. Multilevel endplate irregularity is similar to prior.  No displaced acute fracture or dislocation.  IMPRESSION: White matter changes and volume loss.  No definite acute intracranial abnormality.  Multilevel degenerative changes of the cervical spine.  No acute fracture or dislocation identified.  Original Report Authenticated By: Waneta Martins, M.D.   Ct Head Wo Contrast  08/13/2011  *RADIOLOGY REPORT*  Clinical Data:  78 year old female status post fall.  Hematoma. Pain.  CT HEAD WITHOUT CONTRAST CT CERVICAL SPINE WITHOUT CONTRAST  Technique:  Multidetector CT imaging of the head and cervical spine was performed following the standard protocol without intravenous contrast.  Multiplanar CT  image reconstructions of the cervical spine were also generated.  Comparison:   None  CT HEAD  Findings: No acute orbit soft tissue findings.  Small right lateral/supraorbital scalp contusion or hematoma.  Underlying calvarium intact. Visualized paranasal sinuses and mastoids are clear.  Osteopenia.  Calcified atherosclerosis at the skull base.  No midline shift, mass effect, or evidence of mass lesion.  No ventriculomegaly. No acute intracranial hemorrhage identified.  Patchy cerebral white matter hypodensity.  Generalized volume loss. No evidence of cortically based acute infarction  identified.  No suspicious intracranial vascular hyperdensity.  IMPRESSION: 1.  Mild scalp soft tissue injury without underlying fracture. 2.  No acute traumatic injury to the brain. 3.  Cervical findings are below.  CT CERVICAL SPINE  Findings: Osteopenia. Visualized skull base is intact.  No atlanto- occipital dissociation.  Cervicothoracic junction alignment is within normal limits.  Bilateral posterior element alignment is within normal limits.  No acute cervical fracture identified. Diffuse cervical disc and facet degeneration.  Scarring in the lung apices.  Small hypodense nodule in the right thyroid.  Calcified atherosclerosis.  IMPRESSION: No acute fracture or listhesis identified in the cervical spine. Ligamentous injury is not excluded.  Original Report Authenticated By: Harley Hallmark, M.D.   Ct Cervical Spine Wo Contrast  08/14/2011  *RADIOLOGY REPORT*  Clinical Data: Fall, altered  CT HEAD WITHOUT CONTRAST,CT CERVICAL SPINE WITHOUT CONTRAST  Technique:  Contiguous axial images were obtained from the base of the skull through the vertex without contrast.,Technique: Multidetector CT imaging of the cervical spine was performed. Multiplanar CT image reconstructions were also generated.  Comparison: 08/13/2011  Findings:  Head: Prominence of the sulci, cisterns, and ventricles, in keeping with volume loss. There are subcortical and periventricular white matter hypodensities, a nonspecific finding most often seen with chronic microangiopathic changes. Mild swelling lateral to the right orbit is similar to yesterday's examination.  There is no evidence for acute hemorrhage, overt hydrocephalus, mass lesion, or abnormal extra-axial fluid collection.  No definite CT evidence for acute cortical based (large artery) infarction. Atherosclerotic vascular calcifications.  Diffuse osteopenia. The visualized paranasal sinuses and mastoid air cells are predominately clear.  No displaced calvarial fracture.  Cervical  spine:  Osteopenia and advanced multilevel degenerative changes.  Biapical scarring.  Maintained craniocervical relationship.  No dens fracture.  Mild rightward curvature. Multilevel endplate irregularity is similar to prior.  No displaced acute fracture or dislocation.  IMPRESSION: White matter changes and volume loss.  No definite acute intracranial abnormality.  Multilevel degenerative changes of the cervical spine.  No acute fracture or dislocation identified.  Original Report Authenticated By: Waneta Martins, M.D.   Ct Cervical Spine Wo Contrast  08/13/2011  *RADIOLOGY REPORT*  Clinical Data:  78 year old female status post fall.  Hematoma. Pain.  CT HEAD WITHOUT CONTRAST CT CERVICAL SPINE WITHOUT CONTRAST  Technique:  Multidetector CT imaging of the head and cervical spine was performed following the standard protocol without intravenous contrast.  Multiplanar CT image reconstructions of the cervical spine were also generated.  Comparison:   None  CT HEAD  Findings: No acute orbit soft tissue findings.  Small right lateral/supraorbital scalp contusion or hematoma.  Underlying calvarium intact. Visualized paranasal sinuses and mastoids are clear.  Osteopenia.  Calcified atherosclerosis at the skull base.  No midline shift, mass effect, or evidence of mass lesion.  No ventriculomegaly. No acute intracranial hemorrhage identified.  Patchy cerebral white matter hypodensity.  Generalized volume loss. No evidence of cortically based acute infarction identified.  No suspicious intracranial vascular hyperdensity.  IMPRESSION: 1.  Mild scalp soft tissue injury without underlying fracture. 2.  No acute traumatic injury to the brain. 3.  Cervical findings are below.  CT CERVICAL SPINE  Findings: Osteopenia. Visualized skull base is intact.  No atlanto- occipital dissociation.  Cervicothoracic junction alignment is within normal limits.  Bilateral posterior element alignment is within normal limits.  No acute  cervical fracture identified. Diffuse cervical disc and facet degeneration.  Scarring in the lung apices.  Small hypodense nodule in the right thyroid.  Calcified atherosclerosis.  IMPRESSION: No acute fracture or listhesis identified in the cervical spine. Ligamentous injury is not excluded.  Original Report Authenticated By: Harley Hallmark, M.D.   Ct Abdomen Pelvis W Contrast  08/16/2011  *RADIOLOGY REPORT*  Clinical Data: Bilateral flank pain.  CT ABDOMEN AND PELVIS WITH CONTRAST  Technique:  Multidetector CT imaging of the abdomen and pelvis was performed following the standard protocol during bolus administration of intravenous contrast.  Contrast: 80mL OMNIPAQUE IOHEXOL 300 MG/ML  SOLN  Comparison: CT scan 01/08/2011.  Findings: The lung bases are clear.  The liver is unremarkable.  No focal lesions or intrahepatic biliary dilatation.  The gallbladder is normal.  No common bile duct dilatation.  The pancreas demonstrates mild to moderate atrophy but no focal lesion or inflammatory change.  A moderate sized duodenal diverticulum is noted near the pancreatic head.  The spleen is normal in size.  No focal lesions.  A few small calcified granulomas are noted.  The adrenal glands and kidneys demonstrate no significant abnormalities.  There are small bilateral parapelvic cysts.  No obstructing ureteral calculi and no bladder calculi.  The stomach, duodenum, small bowel and colon are unremarkable. Mild fibrofatty infiltrative changes involving the distal ileum likely due to previous inflammatory bowel disease.  No findings for active bowel inflammation.  There is moderate stool throughout the colon suggesting constipation.  There is severe diverticulosis of the sigmoid colon without findings for acute diverticulitis.  The aorta demonstrates moderate atherosclerotic changes but no focal aneurysm or dissection.  The major branch vessels are patent. No mesenteric or retroperitoneal masses or adenopathy.  The uterus is  surgically absent.  The bladder is distended.  No pelvic mass or free pelvic fluid collections.  No inguinal mass or adenopathy.  The bony structures are intact.  There is severe osteoporosis and there are lumbar fusion changes.  IMPRESSION:  1.  No acute abdominal/pelvic findings, mass lesions or adenopathy. 2.  Moderate stool throughout the colon may suggest constipation. 3.  Mild bladder distention.  Original Report Authenticated By: P. Loralie Champagne, M.D.   Medications: Scheduled Meds:   . antiseptic oral rinse  15 mL Mouth Rinse BID  . dextrose 5 % and 0.45% NaCl   Intravenous STAT  . enoxaparin  40 mg Subcutaneous Q24H  . feeding supplement  1 Container Oral BID BM  . morphine  2 mg Intravenous Once  . ondansetron  4 mg Intravenous Once  . potassium chloride  10 mEq Intravenous Once  . potassium chloride  10 mEq Intravenous Q1 Hr x 3  . potassium chloride  40 mEq Oral Once  . sodium chloride  500 mL Intravenous Once   Continuous Infusions:   . sodium chloride 1,000 mL (08/16/11 1706)  . sodium chloride 50 mL/hr at 08/17/11 0521   PRN Meds:.acetaminophen, acetaminophen, alum & mag hydroxide-simeth, HYDROmorphone (DILAUDID) injection, iohexol, ondansetron (ZOFRAN) IV, ondansetron, oxyCODONE  ASSESSMENT & PLAN: Principal Problem:  *  Compression fracture of T12 vertebra Active Problems:  HTN (hypertension)  Hypokalemia  Hyperlipidemia  Back pain  Weakness generalized  Nausea and vomiting  Falls frequently   Compression fracture of T12 vertebra -Pain is controlled with narcotics, which is not having too much pain when she is not moving. -Will consult Dr. Lovell Sheehan group as he is out of the office. -Conservative management versus vertebroplasty, I would that doubt in her advanced age. -PT/OT, she will probably going to need a skilled nursing facility at least for short-term.  Frequent falls -This is been a chronic problem, she lives in assisted living facility.  -She was  been treated recently from UTI, which probably caused more weakness and falls.  Hypokalemia -Repleted by oral supplements.  Hypertension -Controlled.   LOS: 1 day   Angelica Marshall A 08/17/2011, 12:30 PM

## 2011-08-17 NOTE — Progress Notes (Signed)
INITIAL ADULT NUTRITION ASSESSMENT Date: 08/17/2011   Time: 10:44 AM Reason for Assessment: Nutrition risk   ASSESSMENT: Female 78 y.o.  Dx: Compression fracture of T12 vertebra  Food/Nutrition Related Hx: Pt from assisted living facility, admitted with flank and abdominal pain. Pt has had trouble with falls for the past week or so per H&P. Pt with dementia, however was able to report that she typically eats 1-2 meals/day and has a usual weight of 130 pounds. Pt denied any difficulty chewing, however does c/o difficulty swallowing, which pt is being followed by GI for. Pt states textures of foods do not help with her dysphagia, however she is on medications for this issue. Pt reports drinking 1 Boost nightly, however does not like chocolate flavor and is not interested in Ensure. Pt states she did not eat breakfast this morning because it was not in front of her, however per discussion with RN, pt simply refused to eat breakfast likely r/t dementia. Noted CT of abdomen/pelvis yesterday showed possible constipation - recommend MD order bowel regimen to help with this.   Hx:  Past Medical History  Diagnosis Date  . Hypertension   . Arthritis   . Depression   . Allergic rhinitis due to other allergen   . Other and unspecified hyperlipidemia   . Dementia, unspecified, without behavioral disturbance   . Alzheimer's disease   . Unspecified arthropathy, shoulder region   . Anorexia   . Undiagnosed cardiac murmurs   . GERD (gastroesophageal reflux disease)   . Chronic constipation    Related Meds:  Scheduled Meds:   . antiseptic oral rinse  15 mL Mouth Rinse BID  . dextrose 5 % and 0.45% NaCl   Intravenous STAT  . enoxaparin  40 mg Subcutaneous Q24H  . morphine  2 mg Intravenous Once  . ondansetron  4 mg Intravenous Once  . potassium chloride  10 mEq Intravenous Once  . potassium chloride  10 mEq Intravenous Q1 Hr x 3  . potassium chloride  40 mEq Oral Once  . sodium chloride  500 mL  Intravenous Once   Continuous Infusions:   . sodium chloride 1,000 mL (08/16/11 1706)  . sodium chloride 50 mL/hr at 08/17/11 0521   PRN Meds:.acetaminophen, acetaminophen, alum & mag hydroxide-simeth, HYDROmorphone (DILAUDID) injection, iohexol, ondansetron (ZOFRAN) IV, ondansetron, oxyCODONE  Ht: 5\' 5"  (165.1 cm)  Wt: 126 lb 1.7 oz (57.2 kg)  Ideal Wt: 125 lb % Ideal Wt: 100  Usual Wt: 130-137 lb % Usual Wt: 92-97  Wt Readings from Last 10 Encounters:  08/16/11 126 lb 1.7 oz (57.2 kg)  04/01/11 132 lb 3.2 oz (59.966 kg)  01/08/11 137 lb 12.6 oz (62.5 kg)    Body mass index is 20.98 kg/(m^2).   Labs:  CMP     Component Value Date/Time   NA 136 08/17/2011 0336   K 3.6 08/17/2011 0336   CL 104 08/17/2011 0336   CO2 23 08/17/2011 0336   GLUCOSE 98 08/17/2011 0336   BUN 18 08/17/2011 0336   CREATININE 0.80 08/17/2011 0336   CALCIUM 8.2* 08/17/2011 0336   PROT 7.1 08/16/2011 1655   ALBUMIN 3.5 08/16/2011 1655   AST 15 08/16/2011 1655   ALT 9 08/16/2011 1655   ALKPHOS 85 08/16/2011 1655   BILITOT 1.1 08/16/2011 1655   GFRNONAA 61* 08/17/2011 0336   GFRAA 71* 08/17/2011 0336   No intake or output data in the 24 hours ending 08/17/11 1056  Last BM - PTA  Diet  Order: Dysphagia 3, thin    IVF:    sodium chloride Last Rate: 1,000 mL (08/16/11 1706)  sodium chloride Last Rate: 50 mL/hr at 08/17/11 0521    Estimated Nutritional Needs:   Kcal:1450-1700 Protein:60-70g Fluid:1.4-1.7L  NUTRITION DIAGNOSIS: -Inadequate oral intake (NI-2.1).  Status: Ongoing  RELATED TO: likely r/t abdominal pain, ? constipation  AS EVIDENCE BY: 0% breakfast intake   MONITORING/EVALUATION(Goals): Pt to consume >50% of meals/supplements.   EDUCATION NEEDS: -Education not appropriate at this time  INTERVENTION: Ensure pudding BID. Nursing to encourage increased intake.   Dietitian #: [phone]  DOCUMENTATION CODES Per approved criteria  -Not Applicable    Marshall Cork 08/17/2011, 10:44  AM

## 2011-08-17 NOTE — Progress Notes (Signed)
UR done. 

## 2011-08-17 NOTE — Progress Notes (Signed)
   Spoke with Dr. Phoebe Perch who is on call for Dr. Lovell Sheehan.  He recommended Vertalign spinal support system.  Control pain, followup with Dr. Lovell Sheehan as outpatient.  Clint Lipps Pager: [phone] 08/17/2011, 1:32 PM

## 2011-08-18 DIAGNOSIS — Z9181 History of falling: Secondary | ICD-10-CM

## 2011-08-18 DIAGNOSIS — I10 Essential (primary) hypertension: Secondary | ICD-10-CM

## 2011-08-18 LAB — URINE CULTURE
Colony Count: NO GROWTH
Culture: NO GROWTH

## 2011-08-18 LAB — CBC
Hemoglobin: 12.4 g/dL (ref 12.0–15.0)
MCH: 29.8 pg (ref 26.0–34.0)
MCV: 90.4 fL (ref 78.0–100.0)
Platelets: 261 10*3/uL (ref 150–400)
RBC: 4.16 MIL/uL (ref 3.87–5.11)
WBC: 5.9 10*3/uL (ref 4.0–10.5)

## 2011-08-18 LAB — BASIC METABOLIC PANEL
CO2: 27 mEq/L (ref 19–32)
Chloride: 104 mEq/L (ref 96–112)
Glucose, Bld: 118 mg/dL — ABNORMAL HIGH (ref 70–99)
Potassium: 4 mEq/L (ref 3.5–5.1)
Sodium: 136 mEq/L (ref 135–145)

## 2011-08-18 MED ORDER — SENNA 8.6 MG PO TABS
1.0000 | ORAL_TABLET | Freq: Two times a day (BID) | ORAL | Status: DC
  Administered 2011-08-18 – 2011-08-20 (×3): 8.6 mg via ORAL
  Filled 2011-08-18 (×3): qty 1

## 2011-08-18 MED ORDER — CALCIUM CARBONATE-VITAMIN D 500-200 MG-UNIT PO TABS
1.0000 | ORAL_TABLET | Freq: Two times a day (BID) | ORAL | Status: DC
  Administered 2011-08-18 – 2011-08-20 (×5): 1 via ORAL
  Filled 2011-08-18 (×6): qty 1

## 2011-08-18 MED ORDER — ATORVASTATIN CALCIUM 20 MG PO TABS
20.0000 mg | ORAL_TABLET | Freq: Every day | ORAL | Status: DC
  Administered 2011-08-18 – 2011-08-19 (×2): 20 mg via ORAL
  Filled 2011-08-18 (×3): qty 1

## 2011-08-18 MED ORDER — SERTRALINE HCL 50 MG PO TABS
50.0000 mg | ORAL_TABLET | Freq: Every day | ORAL | Status: DC
  Administered 2011-08-18 – 2011-08-20 (×3): 50 mg via ORAL
  Filled 2011-08-18 (×3): qty 1

## 2011-08-18 MED ORDER — POLYETHYLENE GLYCOL 3350 17 G PO PACK
17.0000 g | PACK | Freq: Every day | ORAL | Status: DC
  Administered 2011-08-18 – 2011-08-20 (×3): 17 g via ORAL
  Filled 2011-08-18 (×3): qty 1

## 2011-08-18 MED ORDER — METOPROLOL TARTRATE 12.5 MG HALF TABLET
12.5000 mg | ORAL_TABLET | Freq: Two times a day (BID) | ORAL | Status: DC
  Administered 2011-08-18 – 2011-08-20 (×5): 12.5 mg via ORAL
  Filled 2011-08-18 (×6): qty 1

## 2011-08-18 NOTE — Evaluation (Signed)
Occupational Therapy Evaluation Patient Details Name: Angelica Marshall MRN: [id number] DOB: February 19, 1915 Today's Date: 08/18/2011 Time: 4098-1191 OT Time Calculation (min): 18 min  OT Assessment / Plan / Recommendation Clinical Impression  Pt is a43 yo female who resides at an ALF who presents with T12 compression fx s/p multiple falls. Skilled OT recommended to maximize I w/BADLs to supervision/min A level in prep for d/c to next venue of care.    OT Assessment  Patient needs continued OT Services    Follow Up Recommendations  Home health OT;Supervision/Assistance - 24 hour;Skilled nursing facility    Barriers to Discharge      Equipment Recommendations  Defer to next venue    Recommendations for Other Services    Frequency  Min 2X/week    Precautions / Restrictions Precautions Precautions: Fall;Back Required Braces or Orthoses: Spinal Brace Spinal Brace: Thoracolumbosacral orthotic;Applied in sitting position (need MD to clarify wear times) Restrictions Weight Bearing Restrictions: No   Pertinent Vitals/Pain     ADL  Grooming: Performed;Wash/dry face;Brushing hair;Set up Where Assessed - Grooming: Unsupported sitting Upper Body Bathing: Simulated;Set up Where Assessed - Upper Body Bathing: Unsupported sitting Lower Body Bathing: Simulated;Moderate assistance Where Assessed - Lower Body Bathing: Supported sit to stand Lower Body Dressing: Simulated;Maximal assistance Where Assessed - Lower Body Dressing: Unsupported sitting Toilet Transfer: Simulated;Minimal assistance Toilet Transfer Method: Sit to stand Toilet Transfer Equipment: Other (comment) (recliner) Toileting - Clothing Manipulation and Hygiene: Simulated;Minimal assistance Where Assessed - Toileting Clothing Manipulation and Hygiene: Sit to stand from 3-in-1 or toilet Equipment Used: Back brace;Rolling walker Transfers/Ambulation Related to ADLs: Pt ambulated ~30 feet with min A. Tolerated well. ADL Comments: Pt  required total A to don brace.    OT Diagnosis: Generalized weakness  OT Problem List: Decreased activity tolerance;Decreased cognition;Decreased safety awareness;Decreased knowledge of use of DME or AE;Pain;Decreased knowledge of precautions OT Treatment Interventions: Self-care/ADL training;Therapeutic activities;DME and/or AE instruction;Patient/family education   OT Goals Acute Rehab OT Goals OT Goal Formulation: With patient Time For Goal Achievement: 09/01/11 Potential to Achieve Goals: Good ADL Goals Pt Will Perform Grooming: with supervision;Standing at sink ADL Goal: Grooming - Progress: Goal set today Pt Will Perform Lower Body Bathing: with supervision;Sit to stand from chair;Sit to stand from bed ADL Goal: Lower Body Bathing - Progress: Goal set today Pt Will Perform Lower Body Dressing: Sit to stand from bed;Sit to stand from chair;with min assist ADL Goal: Lower Body Dressing - Progress: Goal set today Pt Will Transfer to Toilet: with supervision;Ambulation;Comfort height toilet;3-in-1;Maintaining back safety precautions ADL Goal: Toilet Transfer - Progress: Goal set today Pt Will Perform Toileting - Clothing Manipulation: with supervision;Standing ADL Goal: Toileting - Clothing Manipulation - Progress: Goal set today Pt Will Perform Toileting - Hygiene: with supervision;Sit to stand from 3-in-1/toilet ADL Goal: Toileting - Hygiene - Progress: Goal set today  Visit Information  Last OT Received On: 08/18/11 Assistance Needed: +1 PT/OT Co-Evaluation/Treatment: Yes    Subjective Data  Subjective: Ive been to rehab before Patient Stated Goal: Not asked   Prior Functioning  Home Living Available Help at Discharge:  (Pt resides at ALF.) Type of Home: Assisted living Home Adaptive Equipment: Dan Humphreys - four wheeled Prior Function Level of Independence: Independent with assistive device(s) Driving: No Vocation: Retired Musician: No  difficulties Dominant Hand: Right    Cognition  Overall Cognitive Status: History of cognitive impairments - at baseline Arousal/Alertness: Awake/alert Orientation Level: Place;Time Behavior During Session: [facility] for tasks performed Cognition - Other Comments: pt calm  and very able to participate    Extremity/Trunk Assessment Right Upper Extremity Assessment RUE ROM/Strength/Tone: [facility] for tasks assessed Left Upper Extremity Assessment LUE ROM/Strength/Tone: WFL for tasks assessed Right Lower Extremity Assessment RLE ROM/Strength/Tone: WFL for tasks assessed RLE Sensation: WFL - Light Touch Left Lower Extremity Assessment LLE ROM/Strength/Tone: WFL for tasks assessed LLE Sensation: WFL - Light Touch   Mobility Bed Mobility Bed Mobility: Rolling Right;Right Sidelying to Sit Rolling Right: 3: Mod assist;With rail Right Sidelying to Sit: 3: Mod assist;With rails;HOB elevated Details for Bed Mobility Assistance: Physical A needed for LEs and trunk. Max cues for log roll technique. Transfers Transfers: Sit to Stand;Stand to Sit Sit to Stand: 4: Min assist;With upper extremity assist;From bed Stand to Sit: 4: Min assist;With upper extremity assist;With armrests;To chair/3-in-1 Details for Transfer Assistance: Min VCs for safety, hand placement.   Exercise    Balance Balance Balance Assessed: Yes Static Sitting Balance Static Sitting - Balance Support: No upper extremity supported;Feet supported Static Sitting - Level of Assistance: 5: Stand by assistance Static Standing Balance Static Standing - Balance Support: Bilateral upper extremity supported;During functional activity Static Standing - Level of Assistance: 4: Min assist  End of Session OT - End of Session Equipment Utilized During Treatment: Back brace Activity Tolerance: Patient tolerated treatment well Patient left: in chair;with call bell/phone within reach Nurse Communication: Mobility status  GO Functional Assessment  Tool Used: Clinical Judgment Functional Limitation: Self care Self Care Current Status (Z6109): At least 40 percent but less than 60 percent impaired, limited or restricted Self Care Goal Status (U0454): At least 1 percent but less than 20 percent impaired, limited or restricted   [place] A 08/18/2011, 10:36 AM

## 2011-08-18 NOTE — Evaluation (Deleted)
Occupational Therapy Evaluation Patient Details Name: Angelica Marshall MRN: [id number] DOB: 20-Oct-1915 Today's Date: 08/18/2011 Time: 4098-1191 OT Time Calculation (min): 18 min  OT Assessment / Plan / Recommendation Clinical Impression  Pt is a53 yo female who resides at an ALF who presents with T12 compression fx s/p multiple falls. Skilled OT recommended to maximize I w/BADLs to supervision/min A level in prep for d/c to next venue of care.    OT Assessment  Patient needs continued OT Services    Follow Up Recommendations  Home health OT;Supervision/Assistance - 24 hour;Skilled nursing facility    Barriers to Discharge      Equipment Recommendations  None recommended by OT    Recommendations for Other Services    Frequency  Min 2X/week    Precautions / Restrictions Precautions Precautions: Back;Fall Required Braces or Orthoses: Spinal Brace Spinal Brace: Thoracolumbosacral orthotic;Applied in sitting position Restrictions Weight Bearing Restrictions: No   Pertinent Vitals/Pain 95 % on RA following activity.    ADL  Grooming: Performed;Wash/dry face;Brushing hair;Set up Where Assessed - Grooming: Unsupported sitting Upper Body Bathing: Simulated;Set up Where Assessed - Upper Body Bathing: Unsupported sitting Lower Body Bathing: Simulated;Moderate assistance Where Assessed - Lower Body Bathing: Supported sit to stand Lower Body Dressing: Simulated;Maximal assistance Where Assessed - Lower Body Dressing: Unsupported sitting Toilet Transfer: Simulated;Minimal assistance Toilet Transfer Method: Sit to stand Toilet Transfer Equipment: Other (comment) (recliner) Toileting - Clothing Manipulation and Hygiene: Simulated;Minimal assistance Where Assessed - Toileting Clothing Manipulation and Hygiene: Sit to stand from 3-in-1 or toilet Equipment Used: Back brace;Rolling walker Transfers/Ambulation Related to ADLs: Pt ambulated ~30 feet with min A. Tolerated well. ADL Comments: Pt  required total A to don brace.    OT Diagnosis: Generalized weakness  OT Problem List: Decreased activity tolerance;Decreased cognition;Decreased safety awareness;Decreased knowledge of use of DME or AE;Pain;Decreased knowledge of precautions OT Treatment Interventions: Self-care/ADL training;Therapeutic activities;DME and/or AE instruction;Patient/family education   OT Goals Acute Rehab OT Goals OT Goal Formulation: With patient Time For Goal Achievement: 09/01/11 Potential to Achieve Goals: Good ADL Goals Pt Will Perform Grooming: with supervision;Standing at sink ADL Goal: Grooming - Progress: Goal set today Pt Will Perform Lower Body Bathing: with supervision;Sit to stand from chair;Sit to stand from bed ADL Goal: Lower Body Bathing - Progress: Goal set today Pt Will Perform Lower Body Dressing: Sit to stand from bed;Sit to stand from chair;with min assist ADL Goal: Lower Body Dressing - Progress: Goal set today Pt Will Transfer to Toilet: with supervision;Ambulation;Comfort height toilet;3-in-1;Maintaining back safety precautions ADL Goal: Toilet Transfer - Progress: Goal set today Pt Will Perform Toileting - Clothing Manipulation: with supervision;Standing ADL Goal: Toileting - Clothing Manipulation - Progress: Goal set today Pt Will Perform Toileting - Hygiene: with supervision;Sit to stand from 3-in-1/toilet ADL Goal: Toileting - Hygiene - Progress: Goal set today  Visit Information  Last OT Received On: 08/18/11 Assistance Needed: +1 PT/OT Co-Evaluation/Treatment: Yes    Subjective Data  Subjective: Ive been to rehab before Patient Stated Goal: Not asked   Prior Functioning  Home Living Available Help at Discharge:  (Pt resides at ALF.) Type of Home: Assisted living Home Adaptive Equipment: Dan Humphreys - four wheeled Prior Function Level of Independence: Independent with assistive device(s) Driving: No Vocation: Retired Musician: No  difficulties Dominant Hand: Right    Cognition  Overall Cognitive Status: History of cognitive impairments - at baseline Arousal/Alertness: Awake/alert Orientation Level: Disoriented to;Place;Time Behavior During Session: [facility] for tasks performed    Extremity/Trunk Assessment Right  Upper Extremity Assessment RUE ROM/Strength/Tone: [facility] for tasks assessed Left Upper Extremity Assessment LUE ROM/Strength/Tone: WFL for tasks assessed   Mobility Bed Mobility Bed Mobility: Rolling Right;Right Sidelying to Sit Rolling Right: 3: Mod assist;With rail Right Sidelying to Sit: 3: Mod assist;With rails;HOB elevated Details for Bed Mobility Assistance: Physical A needed for LEs and trunk. Max cues for log roll technique. Transfers Transfers: Sit to Stand;Stand to Sit Sit to Stand: 4: Min assist;With upper extremity assist;From bed Stand to Sit: 4: Min assist;With upper extremity assist;With armrests;To chair/3-in-1 Details for Transfer Assistance: Min VCs for safety, hand placement.   Exercise    Balance Balance Balance Assessed: Yes Static Sitting Balance Static Sitting - Balance Support: No upper extremity supported;Feet supported Static Sitting - Level of Assistance: 5: Stand by assistance Static Standing Balance Static Standing - Balance Support: Bilateral upper extremity supported;During functional activity Static Standing - Level of Assistance: 4: Min assist  End of Session OT - End of Session Equipment Utilized During Treatment: Back brace Activity Tolerance: Patient tolerated treatment well Patient left: in chair;with call bell/phone within reach Nurse Communication: Mobility status  GO Functional Assessment Tool Used: Clinical Judgment Functional Limitation: Self care Self Care Current Status (Z6109): At least 40 percent but less than 60 percent impaired, limited or restricted Self Care Goal Status (U0454): At least 1 percent but less than 20 percent impaired, limited or  restricted Self Care Discharge Status [id number]): At least 1 percent but less than 20 percent impaired, limited or restricted   Dejean Tribby A OTR/L 08/18/2011, 9:59 AM

## 2011-08-18 NOTE — Progress Notes (Signed)
Subjective: Still with some pain on her back (especially with movement); but overall a lot better. No fever, no CP and no SOB.   Objective: Vital signs in last 24 hours: Temp:  [97.3 F (36.3 C)-99.5 F (37.5 C)] 99.1 F (37.3 C) (07/03 1015) Pulse Rate:  [71-98] 98  (07/03 1015) Resp:  [16-22] 22  (07/03 1015) BP: (148-171)/(74-99) 163/99 mmHg (07/03 1015) SpO2:  [93 %-99 %] 93 % (07/03 1015) Weight change:  Last BM Date:  (pta )  Intake/Output from previous day: 07/02 0701 - 07/03 0700 In: 962.1 [P.O.:120; I.V.:842.1] Out: -  Total I/O In: 0  Out: 200 [Urine:200]   Physical Exam: General: Alert, awake, oriented X3, in no acute distress. HEENT: No bruits, no goiter. Heart: Regular rate and rhythm, without murmurs, rubs, gallops. Lungs: Clear to auscultation bilaterally. Abdomen: Soft, nontender, nondistended, positive bowel sounds. Extremities: No clubbing, cyanosis or edema with positive pedal pulses. MSK: moves for limbs; MS strength 4/5 bilaterally Neuro: Grossly intact, no new deficit.   Lab Results: Basic Metabolic Panel:  Basename 08/18/11 0327 08/17/11 0336 08/16/11 1655  NA 136 136 --  K 4.0 3.6 --  CL 104 104 --  CO2 27 23 --  GLUCOSE 118* 98 --  BUN 15 18 --  CREATININE 0.82 0.80 --  CALCIUM 8.5 8.2* --  MG -- -- 2.2  PHOS -- -- --   Liver Function Tests:  Basename 08/16/11 1655  AST 15  ALT 9  ALKPHOS 85  BILITOT 1.1  PROT 7.1  ALBUMIN 3.5    Basename 08/16/11 1655  LIPASE 17  AMYLASE --   CBC:  Basename 08/18/11 0327 08/17/11 0336 08/16/11 1655  WBC 5.9 7.3 --  NEUTROABS -- -- 6.9  HGB 12.4 11.9* --  HCT 37.6 35.7* --  MCV 90.4 89.5 --  PLT 261 222 --   Coagulation:  Basename 08/16/11 1655  LABPROT 14.7  INR 1.13   Urinalysis:  Basename 08/16/11 1500  COLORURINE AMBER*  LABSPEC 1.029  PHURINE 6.0  GLUCOSEU NEGATIVE  HGBUR NEGATIVE  BILIRUBINUR SMALL*  KETONESUR 15*  PROTEINUR 30*  UROBILINOGEN 0.2  NITRITE  NEGATIVE  LEUKOCYTESUR TRACE*   Misc. Labs:  Recent Results (from the past 240 hour(s))  URINE CULTURE     Status: Normal   Collection Time   08/16/11  3:00 PM      Component Value Range Status Comment   Specimen Description URINE, CATHETERIZED   Final    Special Requests NONE   Final    Culture  Setup Time 08/17/2011 01:57   Final    Colony Count NO GROWTH   Final    Culture NO GROWTH   Final    Report Status 08/18/2011 FINAL   Final   MRSA PCR SCREENING     Status: Normal   Collection Time   08/17/11  9:04 AM      Component Value Range Status Comment   MRSA by PCR NEGATIVE  NEGATIVE Final     Studies/Results: Dg Chest 2 View  08/16/2011  *RADIOLOGY REPORT*  Clinical Data: Larey Seat last week, flank and low back pain  CHEST - 2 VIEW  Comparison: Portable chest x-ray of 05/30/2011  Findings: No active infiltrate or effusion is seen.  The heart is mildly enlarged.  No acute rib fracture is evident.  However, on the lateral view there is a partially compressed T12 vertebral body which appears new when compared to sagittal images from CT of 01/08/2011.  No  evidence of retropulsion is seen.  The bones are diffusely osteopenic.  IMPRESSION:  1.  No active infiltrate or effusion. 2.  Cardiomegaly. 3.  New partially compressed T12 vertebral body of approximately 40- 50%.  Original Report Authenticated By: Juline Patch, M.D.   Ct Abdomen Pelvis W Contrast  08/16/2011  *RADIOLOGY REPORT*  Clinical Data: Bilateral flank pain.  CT ABDOMEN AND PELVIS WITH CONTRAST  Technique:  Multidetector CT imaging of the abdomen and pelvis was performed following the standard protocol during bolus administration of intravenous contrast.  Contrast: 80mL OMNIPAQUE IOHEXOL 300 MG/ML  SOLN  Comparison: CT scan 01/08/2011.  Findings: The lung bases are clear.  The liver is unremarkable.  No focal lesions or intrahepatic biliary dilatation.  The gallbladder is normal.  No common bile duct dilatation.  The pancreas demonstrates  mild to moderate atrophy but no focal lesion or inflammatory change.  A moderate sized duodenal diverticulum is noted near the pancreatic head.  The spleen is normal in size.  No focal lesions.  A few small calcified granulomas are noted.  The adrenal glands and kidneys demonstrate no significant abnormalities.  There are small bilateral parapelvic cysts.  No obstructing ureteral calculi and no bladder calculi.  The stomach, duodenum, small bowel and colon are unremarkable. Mild fibrofatty infiltrative changes involving the distal ileum likely due to previous inflammatory bowel disease.  No findings for active bowel inflammation.  There is moderate stool throughout the colon suggesting constipation.  There is severe diverticulosis of the sigmoid colon without findings for acute diverticulitis.  The aorta demonstrates moderate atherosclerotic changes but no focal aneurysm or dissection.  The major branch vessels are patent. No mesenteric or retroperitoneal masses or adenopathy.  The uterus is surgically absent.  The bladder is distended.  No pelvic mass or free pelvic fluid collections.  No inguinal mass or adenopathy.  The bony structures are intact.  There is severe osteoporosis and there are lumbar fusion changes.  IMPRESSION:  1.  No acute abdominal/pelvic findings, mass lesions or adenopathy. 2.  Moderate stool throughout the colon may suggest constipation. 3.  Mild bladder distention.  Original Report Authenticated By: P. Loralie Champagne, M.D.    Medications: Scheduled Meds:   . antiseptic oral rinse  15 mL Mouth Rinse BID  . enoxaparin  40 mg Subcutaneous Q24H  . feeding supplement  1 Container Oral BID BM   Continuous Infusions:   . sodium chloride 1,000 mL (08/16/11 1706)  . sodium chloride 1,000 mL (08/18/11 0230)   PRN Meds:.acetaminophen, acetaminophen, ALPRAZolam, alum & mag hydroxide-simeth, HYDROmorphone (DILAUDID) injection, ondansetron (ZOFRAN) IV, ondansetron,  oxyCODONE  Assessment/Plan: 1-Partial Compression fracture of T12 vertebra: improving. Per neurosurgery rec's plan is for conservative management. Will continue pain meds; PT/OT and vertalign support system (when ambulating and up). Per PT/OT will need 24/7 assistance and supervision. ALF according to SW unable to provide this level of care; will plan to d/c to SNF.   2-HTN (hypertension): will resume lopressor 12.5 mg BID; patient BP might be a little elevated due to pain.  3-Hypokalemia:repleted.  4-Hyperlipidemia: continue statins  5-Back pain: due to #1; will continue pain meds and physical therapy.  6-Weakness generalized and Falls frequently: associated with deconditioning and probably low Vit D. Will add daily vit D and calcium and will check Vit D level.  7-DVT: lovenox.    LOS: 2 days   Tyshauna Finkbiner [facility] [phone]  08/18/2011, 10:35 AM

## 2011-08-18 NOTE — Progress Notes (Signed)
UR done. 

## 2011-08-18 NOTE — Evaluation (Signed)
Physical Therapy Evaluation Patient Details Name: Angelica Marshall MRN: [id number] DOB: [birth year] Today's Date: 08/18/2011 Time: 4098-1191 PT Time Calculation (min): 22 min  PT Assessment / Plan / Recommendation Clinical Impression  pt admitted after fall w/ T11 compression fx which was not as painful as anticipated. pt was able to participate well in therapy.Recommend that  pt receive HHPT at ALF is she returns there if facility can provide asssitance for all ADL's.  Pt is not safe up alone and requires asssitance for don/doff brace..  Per MD brace needs to be on at all times when OOB. pt will benefit from PT to improve functioal mobility and safety to DC to next venue.    PT Assessment  Patient needs continued PT services    Follow Up Recommendations  Home health PT;Other (comment) (if DC to ALF)    Barriers to Discharge        Equipment Recommendations  Defer to next venue    Recommendations for Other Services OT consult   Frequency Min 3X/week    Precautions / Restrictions Precautions Precautions: Fall;Back Required Braces or Orthoses: Spinal Brace Spinal Brace: Thoracolumbosacral orthotic;Applied in sitting position (need MD to clarify wear times) Restrictions Weight Bearing Restrictions: No   Pertinent Vitals/Pain 4/10 pain of back, states brace is pressing.      Mobility  Bed Mobility Bed Mobility: Rolling Right;Right Sidelying to Sit Rolling Right: 3: Mod assist;With rail Right Sidelying to Sit: 3: Mod assist;With rails;HOB elevated Details for Bed Mobility Assistance: Physical A needed for LEs and trunk. Max cues for log roll technique. Transfers Transfers: Sit to Stand;Stand to Sit Sit to Stand: 4: Min assist;With upper extremity assist;From bed Stand to Sit: 4: Min assist;With upper extremity assist;With armrests;To chair/3-in-1 Details for Transfer Assistance: Min VCs for safety, hand placement. Ambulation/Gait Ambulation/Gait Assistance: 4: Min  assist Ambulation Distance (Feet): 30 Feet Assistive device: Rolling walker Ambulation/Gait Assistance Details: pt was able to tolerate stand and walk. Required min A for RW guidance and turns Gait Pattern: Step-through pattern Gait velocity: decreased    Exercises     PT Diagnosis: Difficulty walking;Acute pain  PT Problem List: Decreased activity tolerance;Decreased knowledge of use of DME;Decreased safety awareness;Decreased knowledge of precautions;Decreased mobility;Pain PT Treatment Interventions: DME instruction;Gait training;Patient/family education;Functional mobility training;Therapeutic activities   PT Goals Acute Rehab PT Goals PT Goal Formulation: With patient Time For Goal Achievement: 09/01/11 Potential to Achieve Goals: Good Pt will Roll Supine to Right Side: with supervision;with rail;with cues (comment type and amount) PT Goal: Rolling Supine to Right Side - Progress: Goal set today Pt will Roll Supine to Left Side: with supervision;with rail;with cues (comment type and amount) PT Goal: Rolling Supine to Left Side - Progress: Goal set today Pt will go Supine/Side to Sit: with supervision;with HOB 0 degrees;with rail PT Goal: Supine/Side to Sit - Progress: Goal set today Pt will go Sit to Supine/Side: with supervision;with HOB 0 degrees PT Goal: Sit to Supine/Side - Progress: Goal set today Pt will go Sit to Stand: with supervision PT Goal: Sit to Stand - Progress: Goal set today Pt will go Stand to Sit: with supervision PT Goal: Stand to Sit - Progress: Goal set today Pt will Ambulate: 51 - 150 feet;with supervision;with rolling walker PT Goal: Ambulate - Progress: Goal set today Additional Goals Additional Goal #1: Don brace w/ min assist. PT Goal: Additional Goal #1 - Progress: Goal set today  Visit Information  Last PT Received On: 08/18/11 Assistance Needed: +1 PT/OT Co-Evaluation/Treatment:  Yes    Subjective Data  Subjective: I could get up and  walk Patient Stated Goal: to go back to facility   Prior Functioning  Home Living Available Help at Discharge:  (Pt resides at ALF.) Type of Home: Assisted living Home Adaptive Equipment: Dan Humphreys - four wheeled Prior Function Level of Independence: Independent with assistive device(s) Driving: No Vocation: Retired Musician: No difficulties Dominant Hand: Right    Cognition  Overall Cognitive Status: History of cognitive impairments - at baseline Arousal/Alertness: Awake/alert Orientation Level: Place;Time Behavior During Session: WFL for tasks performed Cognition - Other Comments: pt calm and very able to participate    Extremity/Trunk Assessment Right Upper Extremity Assessment RUE ROM/Strength/Tone: [facility] for tasks assessed Left Upper Extremity Assessment LUE ROM/Strength/Tone: WFL for tasks assessed Right Lower Extremity Assessment RLE ROM/Strength/Tone: WFL for tasks assessed RLE Sensation: WFL - Light Touch Left Lower Extremity Assessment LLE ROM/Strength/Tone: WFL for tasks assessed LLE Sensation: WFL - Light Touch   Balance Balance Balance Assessed: Yes Static Sitting Balance Static Sitting - Balance Support: No upper extremity supported;Feet supported Static Sitting - Level of Assistance: 5: Stand by assistance Static Standing Balance Static Standing - Balance Support: Bilateral upper extremity supported;During functional activity Static Standing - Level of Assistance: 4: Min assist  End of Session PT - End of Session Equipment Utilized During Treatment: Back brace Activity Tolerance: Patient tolerated treatment well Patient left: in chair;with call bell/phone within reach Nurse Communication: Mobility status  GP Functional Assessment Tool Used: clinical judgement Functional Limitation: Mobility: Walking and moving around Mobility: Walking and Moving Around Current Status (W0981): At least 20 percent but less than 40 percent impaired, limited  or restricted Mobility: Walking and Moving Around Goal Status [phone]): At least 1 percent but less than 20 percent impaired, limited or restricted   Rada Hay 08/18/2011, 10:37 AM  [phone]

## 2011-08-18 NOTE — Progress Notes (Signed)
CSW spoke with case manager, Angelica Marshall, patient is observation status. CSW called patients son, Angelica Marshall, to confirm that patient will return to [facility] assisted [facility] as patient has not been inpatient for three nights under medicare. CSW explained same and son became very upset. He stated that he spoke with the doctor in the emergency room who said patient would be admitted as inpatient. CSW explained that he may have at the time but patient no longer meets criteria. Angelica Marshall became increasingly upset as he says patient needs a skilled nursing facility. CSW suggested he speak with the case manager and the doctor. CSW stated that CSW would ask case manager to call him. CSW communicated same to Angelica Marshall who stated that the doctor makes the decision.Left voicemail for [facility].  CSW Angelica Marshall to follow up in AM.  Angelica Marshall C. Cesar Rogerson MSW, LCSW [phone]

## 2011-08-19 DIAGNOSIS — E785 Hyperlipidemia, unspecified: Secondary | ICD-10-CM

## 2011-08-19 MED ORDER — TRAMADOL HCL 50 MG PO TABS
50.0000 mg | ORAL_TABLET | Freq: Four times a day (QID) | ORAL | Status: DC | PRN
  Administered 2011-08-19: 50 mg via ORAL
  Filled 2011-08-19: qty 1

## 2011-08-19 MED ORDER — HYDRALAZINE HCL 20 MG/ML IJ SOLN
10.0000 mg | Freq: Once | INTRAMUSCULAR | Status: AC
  Administered 2011-08-19: 10 mg via INTRAVENOUS
  Filled 2011-08-19: qty 1

## 2011-08-19 NOTE — Progress Notes (Signed)
Subjective: No fever, no CP and no SOB. Reports some improvement in her back pain. Complaining of insomnia  Objective: Vital signs in last 24 hours: Temp:  [97.7 F (36.5 C)-98.7 F (37.1 C)] 97.7 F (36.5 C) (07/04 1025) Pulse Rate:  [72-106] 104  (07/04 1025) Resp:  [12-22] 18  (07/04 1025) BP: (110-174)/(74-113) 110/74 mmHg (07/04 1025) SpO2:  [94 %-100 %] 94 % (07/04 1025) Weight change:  Last BM Date:  (PTA)  Intake/Output from previous day: 07/03 0701 - 07/04 0700 In: 577.3 [P.O.:60; I.V.:517.3] Out: 450 [Urine:450] Total I/O In: 120 [P.O.:120] Out: -    Physical Exam: General: Alert, awake, oriented X2, in no acute distress. HEENT: No bruits, no goiter. Heart: Regular rate and rhythm, without murmurs, rubs, gallops. Lungs: Clear to auscultation bilaterally. Abdomen: Soft, nontender, nondistended, positive bowel sounds. Extremities: No clubbing, cyanosis or edema with positive pedal pulses. MSK: moves for limbs; MS strength 4/5 bilaterally Neuro: Grossly intact, no new deficit.   Lab Results: Basic Metabolic Panel:  Basename 08/18/11 0327 08/17/11 0336 08/16/11 1655  NA 136 136 --  K 4.0 3.6 --  CL 104 104 --  CO2 27 23 --  GLUCOSE 118* 98 --  BUN 15 18 --  CREATININE 0.82 0.80 --  CALCIUM 8.5 8.2* --  MG -- -- 2.2  PHOS -- -- --   Liver Function Tests:  Basename 08/16/11 1655  AST 15  ALT 9  ALKPHOS 85  BILITOT 1.1  PROT 7.1  ALBUMIN 3.5    Basename 08/16/11 1655  LIPASE 17  AMYLASE --   CBC:  Basename 08/18/11 0327 08/17/11 0336 08/16/11 1655  WBC 5.9 7.3 --  NEUTROABS -- -- 6.9  HGB 12.4 11.9* --  HCT 37.6 35.7* --  MCV 90.4 89.5 --  PLT 261 222 --   Coagulation:  Basename 08/16/11 1655  LABPROT 14.7  INR 1.13   Urinalysis:  Basename 08/16/11 1500  COLORURINE AMBER*  LABSPEC 1.029  PHURINE 6.0  GLUCOSEU NEGATIVE  HGBUR NEGATIVE  BILIRUBINUR SMALL*  KETONESUR 15*  PROTEINUR 30*  UROBILINOGEN 0.2  NITRITE NEGATIVE    LEUKOCYTESUR TRACE*   Misc. Labs:  Recent Results (from the past 240 hour(s))  URINE CULTURE     Status: Normal   Collection Time   08/16/11  3:00 PM      Component Value Range Status Comment   Specimen Description URINE, CATHETERIZED   Final    Special Requests NONE   Final    Culture  Setup Time 08/17/2011 01:57   Final    Colony Count NO GROWTH   Final    Culture NO GROWTH   Final    Report Status 08/18/2011 FINAL   Final   MRSA PCR SCREENING     Status: Normal   Collection Time   08/17/11  9:04 AM      Component Value Range Status Comment   MRSA by PCR NEGATIVE  NEGATIVE Final     Studies/Results: No results found.  Medications: Scheduled Meds:    . antiseptic oral rinse  15 mL Mouth Rinse BID  . atorvastatin  20 mg Oral q1800  . calcium-vitamin D  1 tablet Oral BID  . enoxaparin  40 mg Subcutaneous Q24H  . feeding supplement  1 Container Oral BID BM  . hydrALAZINE  10 mg Intravenous Once  . metoprolol tartrate  12.5 mg Oral BID  . polyethylene glycol  17 g Oral Daily  . senna  1 tablet Oral  BID  . sertraline  50 mg Oral Daily   Continuous Infusions:    . sodium chloride 10 mL/hr (08/18/11 1041)   PRN Meds:.acetaminophen, acetaminophen, ALPRAZolam, alum & mag hydroxide-simeth, ondansetron (ZOFRAN) IV, ondansetron, oxyCODONE, traMADol, DISCONTD:  HYDROmorphone (DILAUDID) injection  Assessment/Plan: 1-Partial Compression fracture of T12 vertebra: slowly improving. No longer requiring IV pain meds. Per neurosurgery rec's plan is for conservative management. Will add tramadol to use for moderate pain and continue oxycodone for sevre pain. Continue vertalign support system (when ambulating and up). Per PT/OT will need 24/7 assistance and supervision. ALF according to SW unable to provide this level of care; plan is to d/c to SNF; patient doesn't meet inpatient criteria and at this point unable to d/c to SNF; CM and social workers are helping with discharge plans. If  unable to be placed from the hospital will d/c back to ALF with HHPT/OT and HHSW to arrange SNF from ALF.   2-HTN (hypertension): Continue lopressor 12.5 mg BID; patient BP better but with elevation when pain is present.   3-Hypokalemia:repleted.  4-Hyperlipidemia: continue statins  5-Back pain: due to #1; will continue pain meds and physical therapy.  6-Weakness generalized and Falls frequently: associated with deconditioning and probably low Vit D. Will continue daily vit D and calcium and will follow Vit D level.  7-DVT: continue lovenox.    LOS: 3 days   Mylissa Lambe [facility] [phone]  08/19/2011, 11:22 AM

## 2011-08-19 NOTE — Progress Notes (Signed)
Pt's b/p is elevated to 172/113 per dinamap and 174/110 per manual. Pt states she is in pain, but refusing pain med. Stated that she only needed to get warm. NP on call notified. Will cont to monitor. G.Rian Busche,RN

## 2011-08-19 NOTE — Progress Notes (Signed)
OT Note:  Pt states she is too tired to participate in OT this am.  States she hasn't slept in 72 hours.  Will check another day. [place], [place] [phone] 08/19/2011

## 2011-08-19 NOTE — ED Provider Notes (Signed)
Medical screening examination/treatment/procedure(s) were performed by non-physician practitioner and as supervising physician I was immediately available for consultation/collaboration.  Camesha Farooq, MD 08/19/11 2330 

## 2011-08-20 DIAGNOSIS — F411 Generalized anxiety disorder: Secondary | ICD-10-CM

## 2011-08-20 MED ORDER — ALPRAZOLAM 0.25 MG PO TABS: 0.2500 mg | ORAL_TABLET | Freq: Two times a day (BID) | ORAL | Status: DC | PRN

## 2011-08-20 MED ORDER — CALCIUM CARBONATE-VITAMIN D 500-200 MG-UNIT PO TABS: 1.0000 | ORAL_TABLET | Freq: Two times a day (BID) | ORAL | Status: DC

## 2011-08-20 MED ORDER — ENSURE PUDDING PO PUDG: 1.0000 | Freq: Two times a day (BID) | ORAL | Status: DC

## 2011-08-20 MED ORDER — HYDRALAZINE HCL 20 MG/ML IJ SOLN
5.0000 mg | Freq: Once | INTRAMUSCULAR | Status: AC
  Administered 2011-08-20: 5 mg via INTRAVENOUS
  Filled 2011-08-20: qty 1

## 2011-08-20 MED ORDER — ACETAMINOPHEN 325 MG PO TABS: 650.0000 mg | ORAL_TABLET | Freq: Four times a day (QID) | ORAL | Status: DC | PRN

## 2011-08-20 MED ORDER — TRAMADOL HCL 50 MG PO TABS: 50.0000 mg | ORAL_TABLET | Freq: Four times a day (QID) | ORAL | Status: DC | PRN

## 2011-08-20 MED ORDER — OXYCODONE HCL 5 MG PO TABS: 5.0000 mg | ORAL_TABLET | ORAL | Status: AC | PRN

## 2011-08-20 NOTE — Progress Notes (Signed)
Patient to be discharged to ALF today, copies of all discharge medications and instructions to be included in packet and sent to facility with family. Son to transport patient to facility via vehicle.

## 2011-08-20 NOTE — Progress Notes (Addendum)
CSW consulted with the patient about her placement options. CSW was accompanied by case Production designer, theatre/television/film (terri), RN (Dianna) and a Librarian, academic. The patient was able to identify three facilities she is familiar, [facility], [facility] and [facility]. CSW spoke with the son about the mothers options. The son noted that he would be able to come around 3:00 this afternoon.  Kayleen Memos. Leighton Ruff [phone]

## 2011-08-20 NOTE — Progress Notes (Signed)
  On August 17, 2011 EHR MD advisor determined pt did not meet inpatient criteria, instead pt met observation status.  EHR MD advisor spoke with attending MD to notify.  CM spoke with attending MD to notify as well.  Order changed to Observation status.  CSW notified on 08/17/11, pt's status was no longer inpatient, but observation status instead.  On August 18, 2011, EHR MD advisor determined pt to be non acute.  EHR MD advisor notified attending MD.  CM notified CSW pt was no longer meeting criteria to be in hospital.  CSW stated she spoke with pt's son and he was upset that pt was not an inpatient.  CSW informed CM of discussion.  CM explained to CSW that EHR makes decision regarding pt admission status and that CM does not make those decisions.  CM was informed by CSW that message would be left for CSW to follow up on 08/19/11.

## 2011-08-20 NOTE — Progress Notes (Addendum)
CSW received bed offers for the patient. The two sons were called, sidney, [phone] and carl, [phone]. A message was left for them to call back. CSW was informed yesterday the patient maybe ready for discharge pending placement. CSW was informed that the patient is ready for discharge. The patient was not awake enough to make an informed decision. CSW will return to consult. CSW is waiting on a call from one of the brothers and seeking out other family to contact on the patient behalf. Kayleen Memos. Leighton Ruff [phone]

## 2011-08-20 NOTE — Discharge Summary (Signed)
Physician Discharge Summary  Patient ID: Angelica Marshall MRN: [id number] DOB/AGE: May 02, 1915 78 y.o.  Admit date: 08/16/2011 Discharge date: 08/20/2011  Primary Care Physician:  Florentina Jenny, MD   Discharge Diagnoses:   .Compression fracture of T12 vertebra .Back pain .Hypokalemia .Weakness generalized .Nausea and vomiting .HTN (hypertension) .Hyperlipidemia  Medication List  As of 08/20/2011 12:24 PM   STOP taking these medications         hydrochlorothiazide 12.5 MG capsule         TAKE these medications         acetaminophen 325 MG tablet   Commonly known as: TYLENOL   Take 2 tablets (650 mg total) by mouth every 6 (six) hours as needed (or Fever >/= 101).      ALPRAZolam 0.25 MG tablet   Commonly known as: XANAX   Take 1 tablet (0.25 mg total) by mouth every 12 (twelve) hours as needed for anxiety. Sleep/anxiety.      atorvastatin 20 MG tablet   Commonly known as: LIPITOR   Take 20 mg by mouth daily.      calcium-vitamin D 500-200 MG-UNIT per tablet   Commonly known as: OSCAL WITH D   Take 1 tablet by mouth 2 (two) times daily.      CRANBERRY EXTRACT PO   Take 475 mg by mouth 2 (two) times daily. At 8am and 8pm. Tablet.      diclofenac sodium 1 % Gel   Commonly known as: VOLTAREN   Apply 1 application topically 4 (four) times daily as needed. Pain(Site not specified.)  May keep in possession and self-administer.      esomeprazole 40 MG capsule   Commonly known as: NEXIUM   Take 40 mg by mouth daily before breakfast.      feeding supplement Pudg   Take 1 Container by mouth 2 (two) times daily between meals.      loratadine 10 MG tablet   Commonly known as: CLARITIN   Take 10 mg by mouth daily.      Melatonin 3 MG Tabs   Take 1 tablet by mouth at bedtime.      metoprolol tartrate 25 MG tablet   Commonly known as: LOPRESSOR   Take 12.5 mg by mouth 2 (two) times daily.      OVER THE COUNTER MEDICATION   1 application by Other route once a week. Friday.   Wash perineum with  5ml vinegar in 8oz water weekly.      oxyCODONE 5 MG immediate release tablet   Commonly known as: Oxy IR/ROXICODONE   Take 1 tablet (5 mg total) by mouth every 4 (four) hours as needed.      polyethylene glycol packet   Commonly known as: MIRALAX / GLYCOLAX   Take 17 g by mouth daily.      promethazine 12.5 MG tablet   Commonly known as: PHENERGAN   Take 12.5 mg by mouth every 6 (six) hours as needed. Nausea.      senna 8.6 MG Tabs   Commonly known as: SENOKOT   Take 1 tablet by mouth 2 (two) times daily. At 8am and 5pm.      sertraline 50 MG tablet   Commonly known as: ZOLOFT   Take 50 mg by mouth daily.      simethicone 80 MG chewable tablet   Commonly known as: MYLICON   Chew 80 mg by mouth every 6 (six) hours as needed. gas      traMADol 50 MG  tablet   Commonly known as: ULTRAM   Take 1 tablet (50 mg total) by mouth every 6 (six) hours as needed for pain. Knee pain             Disposition and Follow-up:  Patient discharge in stable and improved condition; no signs of infection identified. Per neurosurgery no surgical intervention required and to continue conservative management with spinal brace support and pain meds. Due to high risk for falls and needs of 24/7 assistance/care she will be d/c to a SNF.  Consults:   Neurosurgery curbside PT/OT   Significant Diagnostic Studies:  Dg Chest 2 View  08/16/2011  *RADIOLOGY REPORT*  Clinical Data: Larey Seat last week, flank and low back pain  CHEST - 2 VIEW  Comparison: Portable chest x-ray of 05/30/2011  Findings: No active infiltrate or effusion is seen.  The heart is mildly enlarged.  No acute rib fracture is evident.  However, on the lateral view there is a partially compressed T12 vertebral body which appears new when compared to sagittal images from CT of 01/08/2011.  No evidence of retropulsion is seen.  The bones are diffusely osteopenic.  IMPRESSION:  1.  No active infiltrate or effusion. 2.   Cardiomegaly. 3.  New partially compressed T12 vertebral body of approximately 40- 50%.  Original Report Authenticated By: Juline Patch, M.D.   Ct Abdomen Pelvis W Contrast  08/16/2011  *RADIOLOGY REPORT*  Clinical Data: Bilateral flank pain.  CT ABDOMEN AND PELVIS WITH CONTRAST  Technique:  Multidetector CT imaging of the abdomen and pelvis was performed following the standard protocol during bolus administration of intravenous contrast.  Contrast: 80mL OMNIPAQUE IOHEXOL 300 MG/ML  SOLN  Comparison: CT scan 01/08/2011.  Findings: The lung bases are clear.  The liver is unremarkable.  No focal lesions or intrahepatic biliary dilatation.  The gallbladder is normal.  No common bile duct dilatation.  The pancreas demonstrates mild to moderate atrophy but no focal lesion or inflammatory change.  A moderate sized duodenal diverticulum is noted near the pancreatic head.  The spleen is normal in size.  No focal lesions.  A few small calcified granulomas are noted.  The adrenal glands and kidneys demonstrate no significant abnormalities.  There are small bilateral parapelvic cysts.  No obstructing ureteral calculi and no bladder calculi.  The stomach, duodenum, small bowel and colon are unremarkable. Mild fibrofatty infiltrative changes involving the distal ileum likely due to previous inflammatory bowel disease.  No findings for active bowel inflammation.  There is moderate stool throughout the colon suggesting constipation.  There is severe diverticulosis of the sigmoid colon without findings for acute diverticulitis.  The aorta demonstrates moderate atherosclerotic changes but no focal aneurysm or dissection.  The major branch vessels are patent. No mesenteric or retroperitoneal masses or adenopathy.  The uterus is surgically absent.  The bladder is distended.  No pelvic mass or free pelvic fluid collections.  No inguinal mass or adenopathy.  The bony structures are intact.  There is severe osteoporosis and there are  lumbar fusion changes.  IMPRESSION:  1.  No acute abdominal/pelvic findings, mass lesions or adenopathy. 2.  Moderate stool throughout the colon may suggest constipation. 3.  Mild bladder distention.  Original Report Authenticated By: P. Loralie Champagne, M.D.    Brief H and P: For complete details please refer to admission H and P, but in brief 78 y.o. female from the [facility] who was brought to the ED due to complaints  of severe back pain and weakness and not being able to get out of bed today. Patient had hx of recent falls and reported hurting her back. In the ED X-ray of thoracic spine has demonstrated partial compression fx of her T12 vertebra. Patient admitted for pain control.   Hospital Course:  1-Partial Compression fracture of T12 vertebra: slowly improving. No longer requiring IV pain meds. Per neurosurgery rec's plan is for conservative management. Will use tramadol, tylenol and oxycodone for sevre pain. Continue vertalign support system (when ambulating and in upright position out of bed). Per PT/OT will need 24/7 assistance and supervision. ALF according to SW unable to provide this level of care; plan is to d/c to SNF.  2-HTN (hypertension): Continue lopressor 12.5 mg BID; patient BP better but with elevation when pain is present. Follow low sodium diet and further medication adjustment by PCP. HCTZ discontinue due to orthostatic changes and frequent falls.  3-Hypokalemia:repleted.   4-Hyperlipidemia: continue statins   5-Back pain: due to #1; will continue pain meds and physical therapy.   6-Weakness generalized and Falls frequently: associated with deconditioning and probably low Vit D. Will continue daily vit D and calcium and will require follow up of Vit D level as an outpatient to determine if she will need weekly high doses repletion.  Rest of problems remains stable and the plan is for patient to follow with PCP for further medication  adjustments.  Time spent on Discharge: More than 30 minutes  Signed: Zakkary Thibault 08/20/2011, 12:24 PM

## 2011-08-20 NOTE — Progress Notes (Signed)
Occupational Therapy Treatment Patient Details Name: Angelica Marshall MRN: [id number] DOB: 20-Jun-1915 Today's Date: 08/20/2011 Time: 4098-1191 OT Time Calculation (min): 18 min  OT Assessment / Plan / Recommendation Comments on Treatment Session Pt making steady progress towards goals. con't to recommend snf unless ALF is able to provide necessary level of A at d/c.    Follow Up Recommendations  Skilled nursing facility;Supervision/Assistance - 24 hour    Barriers to Discharge       Equipment Recommendations  Defer to next venue    Recommendations for Other Services    Frequency Min 2X/week   Plan Discharge plan remains appropriate    Precautions / Restrictions Precautions Precautions: Back;Fall Required Braces or Orthoses: Spinal Brace Spinal Brace: Thoracolumbosacral orthotic;Applied in sitting position Restrictions Weight Bearing Restrictions: No   Pertinent Vitals/Pain     ADL  Grooming: Denture care;Wash/dry hands;Minimal assistance (to reach faucet and open package of efferdent.) Where Assessed - Grooming: Supported standing Toilet Transfer: Mining engineer Method: Surveyor, minerals: Other (comment) (recliner) ADL Comments: Pt required max A to don brace.    OT Diagnosis:    OT Problem List:   OT Treatment Interventions:     OT Goals ADL Goals ADL Goal: Grooming - Progress: Progressing toward goals ADL Goal: Toilet Transfer - Progress: Progressing toward goals  Visit Information  Last OT Received On: 08/20/11 Assistance Needed: +1    Subjective Data  Subjective: I cant remember why Im here.   Prior Functioning       Cognition  Overall Cognitive Status: History of cognitive impairments - at baseline Arousal/Alertness: Awake/alert Orientation Level: Disoriented to;Situation Behavior During Session: [facility] for tasks performed    Mobility Bed Mobility Bed Mobility: Rolling Right;Right Sidelying to Sit;Sit to  Sidelying Right Rolling Right: 4: Min guard Right Sidelying to Sit: 4: Min assist Sit to Sidelying Right: 3: Mod assist Details for Bed Mobility Assistance: Assisted BLEs off bed and bring trunk to upright. Multimodal cues for safety, technique and hand placement. Transfers Sit to Stand: 4: Min assist;With upper extremity assist;From bed Stand to Sit: 4: Min assist;With armrests;With upper extremity assist;To chair/3-in-1 Details for Transfer Assistance: VCs for hand placement.   Exercises    Balance    End of Session OT - End of Session Equipment Utilized During Treatment: Back brace Activity Tolerance: Patient tolerated treatment well Patient left: in chair;with call bell/phone within reach  GO Functional Assessment Tool Used: Clinical Judgement Functional Limitation: Self care Self Care Current Status (Y7829): At least 60 percent but less than 80 percent impaired, limited or restricted Self Care Goal Status (F6213): At least 20 percent but less than 40 percent impaired, limited or restricted   Angelica Marshall A OTR/L [phone] 08/20/2011, 1:42 PM

## 2011-08-20 NOTE — Discharge Instructions (Signed)
Home Safety and Preventing Falls Falls are a leading cause of injury and while they affect all age groups, falls have greater short-term and long-term impact on older age groups. However, falls should not be a part of life or aging. It is possible for individuals and their families to use preventive measures to significantly decrease the likelihood that anyone, especially an older adult, will fall. There are many simple measures which can make your home safer with respect to preventing falls. The following actions can help reduce falls among all members of your family and are especially important as you age, when your balance, lower limb strength, coordination, and eyesight may be declining. The use of preventive measures will help to reduce you and your family's risk of falls and serious medical consequences. OUTDOORS  Repair cracks and edges of walkways and driveways.   Remove high doorway thresholds and trim shrubbery on the main path into your home.   Ensure there is good outside lighting at main entrances and along main walkways.   Clear walkways of tools, rocks, debris, and clutter.   Check that handrails are not broken and are securely fastened. Both sides of steps should have handrails.   In the garage, be attentive to and clean up grease or oil spills on the cement. This can make the surface extremely slippery.   In winter, have leaves, snow, and ice cleared regularly.   Use sand or salt on walkways during winter months.  BATHROOM  Install grab bars by the toilet and in the tub and shower.   Use non-skid mats or decals in the tub or shower.   If unable to easily stand unsupported while showering, place a plastic non slip stool in the shower to sit on when needed.   Install night lights.   Keep floors dry and clean up all water on the floor immediately.   Remove soap buildup in tub or shower on a regular basis.   Secure bath mats with non-slip, double-sided rug tape.    Remove tripping hazards from the floors.  BEDROOMS  Install night lights.   Do not use oversized bedding.   Make sure a bedside light is easy to reach.   Keep a telephone by your bedside.   Make sure that you can get in and out of your bed easily.   Have a firm chair, with side arms, to use for getting dressed.   Remove clutter from around closets.   Store clothing, bed coverings, and other household items where you can reach them comfortably.   Remove tripping hazards from the floor.  LIVING AREAS AND STAIRWAYS  Turn on lights to avoid having to walk through dark areas.   Keep lighting uniform in each room. Place brighter lightbulbs in darker areas, including stairways.   Replace lightbulbs that burn out in stairways immediately.   Arrange furniture to provide for clear pathways.   Keep furniture in the same place.   Eliminate or tape down electrical cables in high traffic areas.   Place handrails on both sides of stairways. Use handrails when going up or down stairs.   Most falls occur on the top or bottom 3 steps.   Fix any loose handrails. Make sure handrails on both sides of the stairways are as long as the stairs.   Remove all walkway obstacles.   Coil or tape electrical cords off to the side of walking areas and out of the way. If using many extension cords, have an electrician   put in a new wall outlet to reduce or eliminate them.   Make sure spills are cleaned up quickly and allow time for drying before walking on freshly cleaned floors.   Firmly attach carpet with non-skid or two-sided tape.   Keep frequently used items within easy reach.   Remove tripping hazards such as throw rugs and clutter in walkways. Never leave objects on stairs.   Get rid of throw rugs elsewhere if possible.   Eliminate uneven floor surfaces.   Make sure couches and chairs are easy to get into and out of.   Check carpeting to make sure it is firmly attached along stairs.    Make repairs to worn or loose carpet promptly.   Select a carpet pattern that does not visually hide the edge of steps.   Avoid placing throw rugs or scatter rugs at the top or bottom of stairways, or properly secure with carpet tape to prevent slippage.   Have an electrician put in a light switch at the top and bottom of the stairs.   Get light switches that glow.   Avoid the following practices: hurrying, inattention, obscured vision, carrying large loads, and wearing slip-on shoes.   Be aware of all pets.  KITCHEN  Place items that are used frequently, such as dishes and food, within easy reach.   Keep handles on pots and pans toward the center of the stove. Use back burners when possible.   Make sure spills are cleaned up quickly and allow time for drying.   Avoid walking on wet floors.   Avoid hot utensils and knives.   Position shelves so they are not too high or low.   Place commonly used objects within easy reach.   If necessary, use a sturdy step stool with a grab bar when reaching.   Make sure electrical cables are out of the way.   Do not use floor polish or wax that makes floors slippery.  OTHER HOME FALL PREVENTION STRATEGIES  Wear low heel or rubber sole shoes that are supportive and fit well.   Wear closed toe shoes.   Know and watch for side effects of medications. Have your caregiver or pharmacist look at all your medicines, even over-the-counter medicines. Some medicines can make you sleepy or dizzy.   Exercise regularly. Exercise makes you stronger and improves your balance and coordination.   Limit use of alcohol.   Use eyeglasses if necessary and keep them clean. Have your vision checked every year.   Organize your household in a manner that minimizes the need to walk distances when hurried, or go up and down stairs unnecessarily. For example, have a phone placed on at least each floor of your home. If possible, have a phone beside each sitting  or lying area where you spend the most time at home. Keep emergency numbers posted at all phones.   Use non-skid floor wax.   When using a ladder, make sure:   The base is firm.   All ladder feet are on level ground.   The ladder is angled against the wall properly.   When climbing a ladder, face the ladder and hold the ladder rungs firmly.   If reaching, always keep your hips and body weight centered between the rails.   When using a stepladder, make sure it is fully opened and both spreaders are firmly locked.   Do not climb a closed stepladder.   Avoid climbing beyond the second step from the top   of a stepladder and the 4th rung from the top of an extension ladder.   Learn and use mobility aids as needed.   Change positions slowly. Arise slowly from sitting and lying positions. Sit on the edge of your bed before getting to your feet.   If you have a history of falls, ask someone to add color or contrast paint or tape to grab bars and handrails in your home.   If you have a history of falls, ask someone to place contrasting color strips on first and last steps.   Install an electrical emergency response system if you need one, and know how to use it.   If you have a medical or other condition that causes you to have limited physical strength, it is important that you reach out to family and friends for occasional help.  FOR CHILDREN:  If young children are in the home, use safety gates. At the top of stairs use screw-mounted gates; use pressure-mounted gates for the bottom of the stairs and doorways between rooms.   Young children should be taught to descend stairs on their stomachs, feet first, and later using the handrail.   Keep drawers fully closed to prevent them from being climbed on or pulled out entirely.   Move chairs, cribs, beds and other furniture away from windows.   Consider installing window guards on windows ground floor and up, unless they are emergency  fire exits. Make sure they have easy release mechanisms.   Consider installing special locks that only allow the window to be opened to a certain height.   Never rely on window screens to prevent falls.   Never leave babies alone on changing tables, beds or sofas. Use a changing table that has a restraining strap.   When a child can pull to a standing position, the crib mattress should be adjusted to its lowest position. There should be at least 26 inches between the top rails of the crib drop side and the mattress. Toys, bumper pads, and other objects that can be used as steps to climb out should be removed from the crib.   On bunk beds never allow a child under age 6 to sleep on the top bunk. For older children, if the upper bunk is not against a wall, use guard rails on both sides. No matter how old a child is, keep the guard rails in place on the top bunk since children roll during sleep. Do not permit horseplay on bunks.   Grass and soil surfaces beneath backyard playground equipment should be replaced with hardwood chips, shredded wood mulch, sand, pea gravel, rubber, crushed stone, or another safer material at depths of at least 9 to 12 inches.   When riding bikes or using skates, skateboards, skis, or snowboards, require children to wear helmets. Look for those that have stickers stating that they meet or exceed safety standards.   Vertical posts or pickets in deck, balcony, and stairway railings should be no more than 3 1/2 inches apart if a young baby will have access to the area. The space between horizontal rails or bars, and between the floor and the first horizontal rail or bar, should be no more than 3 1/2 inches.  Document Released: 01/22/2002 Document Revised: 01/21/2011 Document Reviewed: 11/21/2008 [employer] Patient Information 2012 [employer]. 

## 2011-08-20 NOTE — Progress Notes (Signed)
Pt b/p this am was 169/96. On call  was notified. New order given will continue to monitor.

## 2011-08-20 NOTE — Progress Notes (Signed)
Physical Therapy Treatment Patient Details Name: Angelica Marshall MRN: [id number] DOB: 21-Mar-1915 Today's Date: 08/20/2011 Time: 4098-1191 PT Time Calculation (min): 22 min  PT Assessment / Plan / Recommendation Comments on Treatment Session  Tolerated increase in activity well. Possible d/c today. Will need continued rehab.    Follow Up Recommendations  Home health PT;Skilled nursing facility    Barriers to Discharge        Equipment Recommendations  Defer to next venue    Recommendations for Other Services    Frequency Min 3X/week   Plan Discharge plan remains appropriate    Precautions / Restrictions Precautions Precautions: Fall;Back Required Braces or Orthoses: Spinal Brace Spinal Brace: Thoracolumbosacral orthotic;Applied in sitting position Restrictions Weight Bearing Restrictions: No   Pertinent Vitals/Pain     Mobility  Bed Mobility Bed Mobility: Rolling Right;Right Sidelying to Sit;Sit to Sidelying Right Rolling Right: 4: Min assist Right Sidelying to Sit: 3: Mod assist Sit to Sidelying Right: 3: Mod assist Details for Bed Mobility Assistance: Assist for bil LEs off/onto bed and trunk to upright/sidelying. Increased time. Multimodal cues for safety, technique, hand placement Transfers Transfers: Sit to Stand;Stand to Sit Sit to Stand: 4: Min assist;With upper extremity assist;From elevated surface;From bed Stand to Sit: 4: Min assist;With upper extremity assist;To bed Details for Transfer Assistance: VCs safety, handplacement. Assist to rise, stabilize, control descent.  Ambulation/Gait Ambulation/Gait Assistance: 4: Min assist Ambulation Distance (Feet): 100 Feet Assistive device: Rolling walker Ambulation/Gait Assistance Details: Multimodal cues for safety, technique, distance from RW. Good gait speed. Assist for stabilization throughout ambulation and to navigate with RW.  Gait Pattern: Trunk flexed;Step-through pattern    Exercises     PT Diagnosis:      PT Problem List:   PT Treatment Interventions:     PT Goals Acute Rehab PT Goals PT Goal: Rolling Supine to Right Side - Progress: Progressing toward goal PT Goal: Supine/Side to Sit - Progress: Progressing toward goal PT Goal: Sit to Supine/Side - Progress: Progressing toward goal PT Goal: Sit to Stand - Progress: Progressing toward goal PT Goal: Ambulate - Progress: Progressing toward goal  Visit Information  Last PT Received On: 08/20/11 Assistance Needed: +1    Subjective Data  Subjective: "I'm sorry.Marland KitchenMarland KitchenMarland KitchenI don't remember" Patient Stated Goal: To get warm   Cognition  Arousal/Alertness: Awake/alert Orientation Level: Person Behavior During Session: WFL for tasks performed    Balance     End of Session PT - End of Session Equipment Utilized During Treatment: Back brace Activity Tolerance: Patient tolerated treatment well Patient left: in bed;with call bell/phone within reach   GP     Rebeca Alert [facility] 08/20/2011, 11:56 AM [phone]

## 2011-08-20 NOTE — Progress Notes (Signed)
CSW received a phone call from the son [place]. CSW infromed the son that is mother is currently in Observation status. The son had several questions. We spoke about the patients pending discharge and the son being able to discuss placement options. The son, Sherron Ales, who is also (POA) is out of town at the moment and is trying to arrange a time to come back to consult about any concerns with placement. He will call back to also identify if there is anyone else to stand in his place to make decisions for his mom while away. The doctor and case management have been informed.   Kayleen Memos. Leighton Ruff [phone]

## 2011-08-21 LAB — VITAMIN D 1,25 DIHYDROXY: Vitamin D2 1, 25 (OH)2: <8 pg/mL

## 2011-08-23 NOTE — Progress Notes (Signed)
Clinical Social Work Department CLINICAL SOCIAL WORK PLACEMENT NOTE 08/23/2011  Patient:  BLAKELEY, SCHEIER  Account Number:  [id number] Admit date:  08/16/2011  Clinical Social Worker:  Tommi Emery, CLINICAL SOCIAL WORKER  Date/time:  08/20/2011 12:13 PM  Clinical Social Work is seeking post-discharge placement for this patient at the following level of care:   SKILLED NURSING   (*CSW will update this form in Epic as items are completed)   08/19/2011  Patient/family provided with [facility] Department of Clinical Social Work's list of facilities offering this level of care within the geographic area requested by the patient (or if unable, by the patient's family).  08/19/2011  Patient/family informed of their freedom to choose among providers that offer the needed level of care, that participate in Medicare, Medicaid or managed care program needed by the patient, have an available bed and are willing to accept the patient.  08/19/2011  Patient/family informed of [facility]' ownership interest in [facility], as well as of the fact that they are under no obligation to receive care at this facility.  PASARR submitted to [employer] on 08/19/2011 PASARR number received from [employer] on 08/19/2011  FL2 transmitted to all facilities in geographic area requested by pt/family on  08/19/2011 FL2 transmitted to all facilities within larger geographic area on 08/19/2011  Patient informed that his/her managed care company has contracts with or will negotiate with  certain facilities, including the following:     Patient/family informed of bed offers received:  08/19/2011 Patient chooses bed at [facility] Physician recommends and patient chooses bed at  [facility]  Patient to be transferred to [facility] on  08/20/2011 Patient to be transferred to facility by [facility]  The following physician request were entered in Epic:   Additional Comments: there were several factors that played into  the end result of this discharge. while family member were not close, it took some effort getting people together. It took contacting facilities a second time to determine the appropriate placement, if they did not extend an offer the first time.   Kayleen Memos. Leighton Ruff [phone]

## 2012-06-08 ENCOUNTER — Emergency Department (HOSPITAL_COMMUNITY): Payer: Medicare Other

## 2012-06-08 ENCOUNTER — Emergency Department (HOSPITAL_COMMUNITY)
Admission: EM | Admit: 2012-06-08 | Discharge: 2012-06-08 | Disposition: A | Discharge status: Home / Self Care | Payer: Medicare Other | Attending: Emergency Medicine | Admitting: Emergency Medicine

## 2012-06-08 ENCOUNTER — Encounter (HOSPITAL_COMMUNITY): Payer: Self-pay | Admitting: Emergency Medicine

## 2012-06-08 DIAGNOSIS — Z79899 Other long term (current) drug therapy: Secondary | ICD-10-CM | POA: Insufficient documentation

## 2012-06-08 DIAGNOSIS — E785 Hyperlipidemia, unspecified: Secondary | ICD-10-CM | POA: Insufficient documentation

## 2012-06-08 DIAGNOSIS — Y9210 Unspecified residential institution as the place of occurrence of the external cause: Secondary | ICD-10-CM | POA: Insufficient documentation

## 2012-06-08 DIAGNOSIS — W19XXXA Unspecified fall, initial encounter: Secondary | ICD-10-CM

## 2012-06-08 DIAGNOSIS — Z87891 Personal history of nicotine dependence: Secondary | ICD-10-CM | POA: Insufficient documentation

## 2012-06-08 DIAGNOSIS — M1990 Unspecified osteoarthritis, unspecified site: Secondary | ICD-10-CM | POA: Insufficient documentation

## 2012-06-08 DIAGNOSIS — S59909A Unspecified injury of unspecified elbow, initial encounter: Secondary | ICD-10-CM | POA: Insufficient documentation

## 2012-06-08 DIAGNOSIS — Z23 Encounter for immunization: Secondary | ICD-10-CM | POA: Insufficient documentation

## 2012-06-08 DIAGNOSIS — F0280 Dementia in other diseases classified elsewhere without behavioral disturbance: Secondary | ICD-10-CM | POA: Insufficient documentation

## 2012-06-08 DIAGNOSIS — S0990XA Unspecified injury of head, initial encounter: Secondary | ICD-10-CM

## 2012-06-08 DIAGNOSIS — S0003XA Contusion of scalp, initial encounter: Secondary | ICD-10-CM | POA: Insufficient documentation

## 2012-06-08 DIAGNOSIS — F329 Major depressive disorder, single episode, unspecified: Secondary | ICD-10-CM | POA: Insufficient documentation

## 2012-06-08 DIAGNOSIS — K219 Gastro-esophageal reflux disease without esophagitis: Secondary | ICD-10-CM | POA: Insufficient documentation

## 2012-06-08 DIAGNOSIS — G309 Alzheimer's disease, unspecified: Secondary | ICD-10-CM | POA: Insufficient documentation

## 2012-06-08 DIAGNOSIS — I10 Essential (primary) hypertension: Secondary | ICD-10-CM | POA: Insufficient documentation

## 2012-06-08 DIAGNOSIS — F3289 Other specified depressive episodes: Secondary | ICD-10-CM | POA: Insufficient documentation

## 2012-06-08 DIAGNOSIS — S1093XA Contusion of unspecified part of neck, initial encounter: Secondary | ICD-10-CM | POA: Insufficient documentation

## 2012-06-08 DIAGNOSIS — W1809XA Striking against other object with subsequent fall, initial encounter: Secondary | ICD-10-CM | POA: Insufficient documentation

## 2012-06-08 DIAGNOSIS — K5909 Other constipation: Secondary | ICD-10-CM | POA: Insufficient documentation

## 2012-06-08 DIAGNOSIS — R011 Cardiac murmur, unspecified: Secondary | ICD-10-CM | POA: Insufficient documentation

## 2012-06-08 DIAGNOSIS — Y939 Activity, unspecified: Secondary | ICD-10-CM | POA: Insufficient documentation

## 2012-06-08 DIAGNOSIS — S6990XA Unspecified injury of unspecified wrist, hand and finger(s), initial encounter: Secondary | ICD-10-CM | POA: Insufficient documentation

## 2012-06-08 LAB — URINALYSIS, ROUTINE W REFLEX MICROSCOPIC
Ketones, ur: NEGATIVE mg/dL
Nitrite: NEGATIVE
Protein, ur: NEGATIVE mg/dL

## 2012-06-08 LAB — CBC WITH DIFFERENTIAL/PLATELET
Eosinophils Absolute: 0.2 10*3/uL (ref 0.0–0.7)
Eosinophils Relative: 4 % (ref 0–5)
Lymphs Abs: 1.2 10*3/uL (ref 0.7–4.0)
MCH: 30.2 pg (ref 26.0–34.0)
MCHC: 32.6 g/dL (ref 30.0–36.0)
MCV: 92.7 fL (ref 78.0–100.0)
Monocytes Relative: 9 % (ref 3–12)
Platelets: 297 10*3/uL (ref 150–400)
RBC: 3.68 MIL/uL — ABNORMAL LOW (ref 3.87–5.11)

## 2012-06-08 LAB — PROTIME-INR
INR: 1.05 (ref 0.00–1.49)
Prothrombin Time: 13.6 seconds (ref 11.6–15.2)

## 2012-06-08 LAB — TROPONIN I: Troponin I: <0.3 ng/mL (ref <0.30)

## 2012-06-08 LAB — COMPREHENSIVE METABOLIC PANEL
BUN: 19 mg/dL (ref 6–23)
Calcium: 9.1 mg/dL (ref 8.4–10.5)
GFR calc Af Amer: 65 mL/min — ABNORMAL LOW (ref >90)
Glucose, Bld: 148 mg/dL — ABNORMAL HIGH (ref 70–99)
Total Protein: 6.2 g/dL (ref 6.0–8.3)

## 2012-06-08 LAB — URINE MICROSCOPIC-ADD ON

## 2012-06-08 MED ORDER — TETANUS-DIPHTH-ACELL PERTUSSIS 5-2.5-18.5 LF-MCG/0.5 IM SUSP
0.5000 mL | Freq: Once | INTRAMUSCULAR | Status: AC
  Administered 2012-06-08: 0.5 mL via INTRAMUSCULAR
  Filled 2012-06-08: qty 0.5

## 2012-06-08 NOTE — ED Notes (Signed)
Cleaned wound to head with normal saline.

## 2012-06-08 NOTE — ED Provider Notes (Signed)
History     CSN: [id number]  Arrival date & time 06/08/12  1816   First MD Initiated Contact with Patient 06/08/12 1902      Chief Complaint  Patient presents with  . Fall    (Consider location/radiation/quality/duration/timing/severity/associated sxs/prior treatment) HPI Pt fell from standing striking head and R elbow on salad bar while eating lunch at assisted living facility. No LOC. Pt can not recall whether she tripped. Denied recent fever, chills, cough, CP, SOB, abd pain, N/V/D, or urinary symptoms. No focal weakness or neck pain.  Past Medical History  Diagnosis Date  . Hypertension   . Arthritis   . Depression   . Allergic rhinitis due to other allergen   . Other and unspecified hyperlipidemia   . Dementia, unspecified, without behavioral disturbance   . Alzheimer's disease   . Unspecified arthropathy, shoulder region   . Anorexia   . Undiagnosed cardiac murmurs   . GERD (gastroesophageal reflux disease)   . Chronic constipation     Past Surgical History  Procedure Laterality Date  . Back surgery    . Tonsillectomy      No family history on file.  History  Substance Use Topics  . Smoking status: Former Games developer  . Smokeless tobacco: Not on file  . Alcohol Use: No    OB History   Grav Para Term Preterm Abortions TAB SAB Ect Mult Living                  Review of Systems  Constitutional: Negative for fever and chills.  HENT: Negative for neck pain.   Respiratory: Negative for cough and shortness of breath.   Cardiovascular: Negative for chest pain and leg swelling.  Gastrointestinal: Negative for nausea, vomiting, abdominal pain and diarrhea.  Genitourinary: Negative for dysuria and frequency.  Musculoskeletal: Positive for joint swelling. Negative for back pain.  Skin: Negative for rash and wound.  Neurological: Positive for headaches. Negative for dizziness, syncope, weakness, light-headedness and numbness.  All other systems reviewed and are  negative.    Allergies  Ciprofloxacin  Home Medications   Current Outpatient Rx  Name  Route  Sig  Dispense  Refill  . acetaminophen (TYLENOL) 500 MG tablet   Oral   Take 500 mg by mouth 3 (three) times daily.         Marland Kitchen ALPRAZolam (XANAX) 0.25 MG tablet   Oral   Take 0.25 mg by mouth 2 (two) times daily as needed for sleep.         Marland Kitchen ALPRAZolam (XANAX) 0.5 MG tablet   Oral   Take 0.5 mg by mouth at bedtime.         Marland Kitchen atorvastatin (LIPITOR) 20 MG tablet   Oral   Take 20 mg by mouth daily.           . calcium-vitamin D (OSCAL WITH D) 500-200 MG-UNIT per tablet   Oral   Take 1 tablet by mouth 2 (two) times daily.         Marland Kitchen CRANBERRY EXTRACT PO   Oral   Take 475 mg by mouth 2 (two) times daily.          . Ensure Plus (ENSURE PLUS) LIQD   Oral   Take 237 mLs by mouth 2 (two) times daily.         Marland Kitchen esomeprazole (NEXIUM) 40 MG capsule   Oral   Take 40 mg by mouth daily before breakfast.           .  gabapentin (NEURONTIN) 300 MG capsule   Oral   Take 300 mg by mouth 3 (three) times daily.         . hydrochlorothiazide (MICROZIDE) 12.5 MG capsule   Oral   Take 12.5 mg by mouth daily.         Marland Kitchen loperamide (IMODIUM) 2 MG capsule   Oral   Take 2 mg by mouth 4 (four) times daily as needed for diarrhea or loose stools.         Marland Kitchen loratadine (CLARITIN) 10 MG tablet   Oral   Take 10 mg by mouth daily.         . Melatonin 3 MG TABS   Oral   Take 1 tablet by mouth at bedtime.         . metoprolol tartrate (LOPRESSOR) 25 MG tablet   Oral   Take 12.5 mg by mouth 2 (two) times daily.         . ondansetron (ZOFRAN) 4 MG tablet   Oral   Take 4 mg by mouth every 6 (six) hours as needed for nausea.         Marland Kitchen senna (SENOKOT) 8.6 MG TABS   Oral   Take 1 tablet by mouth 2 (two) times daily.         Marland Kitchen senna-docusate (SENOKOT-S) 8.6-50 MG per tablet   Oral   Take 1 tablet by mouth at bedtime.         . sertraline (ZOLOFT) 50 MG  tablet   Oral   Take 50 mg by mouth daily.         . simethicone (MYLICON) 80 MG chewable tablet   Oral   Chew 80 mg by mouth every 6 (six) hours as needed for flatulence.          . traMADol (ULTRAM) 50 MG tablet   Oral   Take 50 mg by mouth 3 (three) times daily.           BP 135/69  Pulse 70  Temp(Src) 97.8 F (36.6 C) (Oral)  Resp 18  SpO2 96%  Physical Exam  Nursing note and vitals reviewed. Constitutional: She is oriented to person, place, and time. She appears well-developed and well-nourished. No distress.  HENT:  Head: Normocephalic.  Mouth/Throat: Oropharynx is clear and moist.  R parietal scalp contusion and punctate laceration without active bleeding.   Eyes: EOM are normal. Pupils are equal, round, and reactive to light.  Neck: Normal range of motion. Neck supple.  No posterior cervical spine tenderness  Cardiovascular: Normal rate and regular rhythm.   Pulmonary/Chest: Effort normal and breath sounds normal. No respiratory distress. She has no wheezes. She has no rales.  Abdominal: Soft. Bowel sounds are normal. She exhibits no distension and no mass. There is no tenderness. There is no rebound and no guarding.  Musculoskeletal: Normal range of motion. She exhibits tenderness. She exhibits no edema.  R elbow with contusion. Normal ROM. 2+radial pulse  Neurological: She is alert and oriented to person, place, and time.  5/5 motor in all ext, sensation intact  Skin: Skin is warm and dry. No rash noted. No erythema.  Psychiatric: She has a normal mood and affect. Her behavior is normal.    ED Course  Procedures (including critical care time)  Labs Reviewed  CBC WITH DIFFERENTIAL - Abnormal; Notable for the following:    RBC 3.68 (*)    Hemoglobin 11.1 (*)    HCT 34.1 (*)  RDW 16.2 (*)    All other components within normal limits  COMPREHENSIVE METABOLIC PANEL - Abnormal; Notable for the following:    Glucose, Bld 148 (*)    Albumin 2.9 (*)     Total Bilirubin 0.2 (*)    GFR calc non Af Amer 56 (*)    GFR calc Af Amer 65 (*)    All other components within normal limits  URINALYSIS, ROUTINE W REFLEX MICROSCOPIC - Abnormal; Notable for the following:    Hgb urine dipstick TRACE (*)    Leukocytes, UA MODERATE (*)    All other components within normal limits  APTT - Abnormal; Notable for the following:    aPTT 43 (*)    All other components within normal limits  URINE CULTURE  PROTIME-INR  TROPONIN I  URINE MICROSCOPIC-ADD ON   Dg Elbow Complete Right  06/08/2012  *RADIOLOGY REPORT*  Clinical Data: Fall with right elbow pain and injury.  RIGHT ELBOW - COMPLETE 3+ VIEW  Comparison:  None.  Findings:  There is no evidence of fracture, dislocation, or joint effusion.  There is no evidence of arthropathy or other focal bone abnormality.  Bones are osteopenic.  Soft tissues are unremarkable. No joint effusion is identified.  IMPRESSION: No acute fracture.   Original Report Authenticated By: Irish Lack, M.D.    Ct Head Wo Contrast  06/08/2012  *RADIOLOGY REPORT*  Clinical Data: Unwitnessed fall, hitting head.  History of dementia.  No loss of consciousness.  CT HEAD WITHOUT CONTRAST  Technique:  Contiguous axial images were obtained from the base of the skull through the vertex without contrast.  Comparison: 08/14/2011.  Findings: There is no evidence for acute infarction, intracranial hemorrhage, mass lesion, hydrocephalus, or extra-axial fluid. Moderate atrophy.  Extensive chronic microvascular ischemic change. Bilateral cataract extraction.  Moderate sized right temporoparietal scalp hematoma with subcutaneous air suggesting laceration.  There is no visible underlying skull fracture.  There is no adjacent subdural hematoma. No contrecoup injury is identified.  There is advanced vascular calcification.  The sinuses and mastoids are clear.  Compared with priors, a similar appearance is noted.  IMPRESSION: Right temporoparietal scalp  laceration and hematoma.  No skull fracture or intracranial hemorrhage.  Atrophy and chronic microvascular ischemic change appears stable.   Original Report Authenticated By: Davonna Belling, M.D.      1. Fall, initial encounter   2. Closed head injury, initial encounter   3. Scalp contusion, initial encounter       MDM   Recent urine culture with no growth. Will not start ab and have f/u with PMD to check culture. Given head injury precautions.        Loren Racer, MD 06/08/12 [phone]

## 2012-06-08 NOTE — ED Notes (Addendum)
Per EMS: from [facility]), unwitnessed fall in cafeteria slipped and fell hit head against salad bar. No LOC. Staff heard the fall but didn't see it. Hematoma right side of head and right elbow. Blind in right eye.

## 2012-06-08 NOTE — ED Notes (Signed)
LKG:[id number]<UV> Expected date:<BR> Expected time:<BR> Means of arrival:Ambulance<BR> Comments:<BR> 89yoF/fall

## 2012-06-08 NOTE — ED Notes (Signed)
Report called to Tammy at [facility].  Pt returning.

## 2012-06-09 ENCOUNTER — Telehealth (HOSPITAL_COMMUNITY): Payer: Self-pay | Admitting: Emergency Medicine

## 2012-06-10 LAB — URINE CULTURE

## 2012-06-11 ENCOUNTER — Encounter (HOSPITAL_COMMUNITY): Payer: Self-pay

## 2012-06-11 ENCOUNTER — Emergency Department (HOSPITAL_COMMUNITY): Payer: Medicare Other

## 2012-06-11 ENCOUNTER — Inpatient Hospital Stay (HOSPITAL_COMMUNITY)
Admission: EM | Admit: 2012-06-11 | Discharge: 2012-06-15 | DRG: 689 | Disposition: A | Discharge status: Home Health | Payer: Medicare Other | Attending: Internal Medicine | Admitting: Internal Medicine

## 2012-06-11 DIAGNOSIS — F03918 Unspecified dementia, unspecified severity, with other behavioral disturbance: Secondary | ICD-10-CM | POA: Diagnosis present

## 2012-06-11 DIAGNOSIS — E860 Dehydration: Secondary | ICD-10-CM

## 2012-06-11 DIAGNOSIS — F419 Anxiety disorder, unspecified: Secondary | ICD-10-CM

## 2012-06-11 DIAGNOSIS — N179 Acute kidney failure, unspecified: Secondary | ICD-10-CM | POA: Diagnosis present

## 2012-06-11 DIAGNOSIS — G9341 Metabolic encephalopathy: Secondary | ICD-10-CM

## 2012-06-11 DIAGNOSIS — R410 Disorientation, unspecified: Secondary | ICD-10-CM

## 2012-06-11 DIAGNOSIS — N390 Urinary tract infection, site not specified: Principal | ICD-10-CM

## 2012-06-11 DIAGNOSIS — G309 Alzheimer's disease, unspecified: Secondary | ICD-10-CM

## 2012-06-11 DIAGNOSIS — K219 Gastro-esophageal reflux disease without esophagitis: Secondary | ICD-10-CM | POA: Diagnosis present

## 2012-06-11 DIAGNOSIS — M549 Dorsalgia, unspecified: Secondary | ICD-10-CM

## 2012-06-11 DIAGNOSIS — Z66 Do not resuscitate: Secondary | ICD-10-CM | POA: Diagnosis present

## 2012-06-11 DIAGNOSIS — F05 Delirium due to known physiological condition: Secondary | ICD-10-CM | POA: Diagnosis present

## 2012-06-11 DIAGNOSIS — F0391 Unspecified dementia with behavioral disturbance: Secondary | ICD-10-CM | POA: Diagnosis present

## 2012-06-11 DIAGNOSIS — F0280 Dementia in other diseases classified elsewhere without behavioral disturbance: Secondary | ICD-10-CM

## 2012-06-11 DIAGNOSIS — R531 Weakness: Secondary | ICD-10-CM | POA: Diagnosis present

## 2012-06-11 DIAGNOSIS — I10 Essential (primary) hypertension: Secondary | ICD-10-CM

## 2012-06-11 DIAGNOSIS — Z79899 Other long term (current) drug therapy: Secondary | ICD-10-CM

## 2012-06-11 DIAGNOSIS — E785 Hyperlipidemia, unspecified: Secondary | ICD-10-CM | POA: Diagnosis present

## 2012-06-11 DIAGNOSIS — Z87891 Personal history of nicotine dependence: Secondary | ICD-10-CM

## 2012-06-11 LAB — BASIC METABOLIC PANEL
Calcium: 9 mg/dL (ref 8.4–10.5)
Chloride: 102 mEq/L (ref 96–112)
Creatinine, Ser: 1.27 mg/dL — ABNORMAL HIGH (ref 0.50–1.10)
GFR calc Af Amer: 41 mL/min — ABNORMAL LOW (ref >90)
GFR calc non Af Amer: 35 mL/min — ABNORMAL LOW (ref >90)

## 2012-06-11 LAB — URINALYSIS, ROUTINE W REFLEX MICROSCOPIC
Ketones, ur: NEGATIVE mg/dL
Nitrite: NEGATIVE
Protein, ur: NEGATIVE mg/dL
Urobilinogen, UA: 1 mg/dL (ref 0.0–1.0)
pH: 7 (ref 5.0–8.0)

## 2012-06-11 LAB — CBC WITH DIFFERENTIAL/PLATELET
Basophils Absolute: 0 10*3/uL (ref 0.0–0.1)
Basophils Relative: 1 % (ref 0–1)
Eosinophils Absolute: 0.3 10*3/uL (ref 0.0–0.7)
HCT: 35 % — ABNORMAL LOW (ref 36.0–46.0)
MCHC: 32.9 g/dL (ref 30.0–36.0)
Monocytes Absolute: 0.7 10*3/uL (ref 0.1–1.0)
Neutro Abs: 2.9 10*3/uL (ref 1.7–7.7)
RDW: 16.5 % — ABNORMAL HIGH (ref 11.5–15.5)

## 2012-06-11 LAB — AMMONIA: Ammonia: 20 umol/L (ref 11–60)

## 2012-06-11 LAB — URINE MICROSCOPIC-ADD ON

## 2012-06-11 MED ORDER — SODIUM CHLORIDE 0.9 % IJ SOLN
3.0000 mL | Freq: Two times a day (BID) | INTRAMUSCULAR | Status: DC
  Administered 2012-06-12 – 2012-06-15 (×6): 3 mL via INTRAVENOUS

## 2012-06-11 MED ORDER — GABAPENTIN 300 MG PO CAPS
300.0000 mg | ORAL_CAPSULE | Freq: Three times a day (TID) | ORAL | Status: DC
  Administered 2012-06-11 – 2012-06-14 (×9): 300 mg via ORAL
  Filled 2012-06-11 (×11): qty 1

## 2012-06-11 MED ORDER — SERTRALINE HCL 50 MG PO TABS
50.0000 mg | ORAL_TABLET | Freq: Every day | ORAL | Status: DC
  Administered 2012-06-12 – 2012-06-15 (×5): 50 mg via ORAL
  Filled 2012-06-11 (×6): qty 1

## 2012-06-11 MED ORDER — DEXTROSE 5 % IV SOLN
1.0000 g | INTRAVENOUS | Status: DC
  Administered 2012-06-11 – 2012-06-13 (×3): 1 g via INTRAVENOUS
  Filled 2012-06-11 (×4): qty 10

## 2012-06-11 MED ORDER — ENSURE COMPLETE PO LIQD
237.0000 mL | Freq: Two times a day (BID) | ORAL | Status: DC
  Administered 2012-06-11 – 2012-06-15 (×7): 237 mL via ORAL
  Filled 2012-06-11 (×4): qty 237

## 2012-06-11 MED ORDER — SODIUM CHLORIDE 0.9 % IV SOLN
INTRAVENOUS | Status: DC
  Administered 2012-06-11: 75 mL/h via INTRAVENOUS

## 2012-06-11 MED ORDER — CALCIUM CARBONATE-VITAMIN D 500-200 MG-UNIT PO TABS
1.0000 | ORAL_TABLET | Freq: Two times a day (BID) | ORAL | Status: DC
  Administered 2012-06-11 – 2012-06-15 (×8): 1 via ORAL
  Filled 2012-06-11 (×10): qty 1

## 2012-06-11 MED ORDER — SODIUM CHLORIDE 0.9 % IV SOLN
INTRAVENOUS | Status: DC
  Administered 2012-06-11: 17:00:00 via INTRAVENOUS

## 2012-06-11 MED ORDER — TRAMADOL HCL 50 MG PO TABS
50.0000 mg | ORAL_TABLET | Freq: Three times a day (TID) | ORAL | Status: DC
  Administered 2012-06-11 – 2012-06-15 (×11): 50 mg via ORAL
  Filled 2012-06-11 (×14): qty 1

## 2012-06-11 MED ORDER — METOPROLOL TARTRATE 12.5 MG HALF TABLET
12.5000 mg | ORAL_TABLET | Freq: Two times a day (BID) | ORAL | Status: DC
  Administered 2012-06-11 – 2012-06-15 (×8): 12.5 mg via ORAL
  Filled 2012-06-11 (×9): qty 1

## 2012-06-11 MED ORDER — ATORVASTATIN CALCIUM 20 MG PO TABS
20.0000 mg | ORAL_TABLET | Freq: Every day | ORAL | Status: DC
  Administered 2012-06-12 – 2012-06-15 (×5): 20 mg via ORAL
  Filled 2012-06-11 (×6): qty 1

## 2012-06-11 MED ORDER — HEPARIN SODIUM (PORCINE) 5000 UNIT/ML IJ SOLN
5000.0000 [IU] | Freq: Three times a day (TID) | INTRAMUSCULAR | Status: DC
  Administered 2012-06-11 – 2012-06-15 (×12): 5000 [IU] via SUBCUTANEOUS
  Filled 2012-06-11 (×15): qty 1

## 2012-06-11 MED ORDER — SENNOSIDES-DOCUSATE SODIUM 8.6-50 MG PO TABS
1.0000 | ORAL_TABLET | Freq: Every day | ORAL | Status: DC
  Administered 2012-06-11 – 2012-06-14 (×5): 1 via ORAL
  Filled 2012-06-11 (×6): qty 1

## 2012-06-11 MED ORDER — ACETAMINOPHEN 500 MG PO TABS
500.0000 mg | ORAL_TABLET | Freq: Three times a day (TID) | ORAL | Status: DC
  Administered 2012-06-11 – 2012-06-15 (×11): 500 mg via ORAL
  Filled 2012-06-11 (×13): qty 1

## 2012-06-11 MED ORDER — PANTOPRAZOLE SODIUM 40 MG PO TBEC
80.0000 mg | DELAYED_RELEASE_TABLET | Freq: Every day | ORAL | Status: DC
  Administered 2012-06-12 – 2012-06-15 (×4): 80 mg via ORAL
  Filled 2012-06-11 (×4): qty 2

## 2012-06-11 MED ORDER — LOPERAMIDE HCL 2 MG PO CAPS: 2.0000 mg | ORAL_CAPSULE | Freq: Four times a day (QID) | ORAL | Status: DC | PRN

## 2012-06-11 MED ORDER — SENNA 8.6 MG PO TABS
1.0000 | ORAL_TABLET | Freq: Two times a day (BID) | ORAL | Status: DC
  Administered 2012-06-11 – 2012-06-15 (×8): 8.6 mg via ORAL
  Filled 2012-06-11 (×6): qty 1

## 2012-06-11 MED ORDER — ONDANSETRON HCL 4 MG PO TABS: 4.0000 mg | ORAL_TABLET | Freq: Four times a day (QID) | ORAL | Status: DC | PRN

## 2012-06-11 NOTE — ED Notes (Signed)
Family at bedside. 

## 2012-06-11 NOTE — ED Notes (Signed)
Per EMS- Pt seen here at [facility] DX with UTI tx and sent back to facility.  Pt continues to have symtpoms of UTI, weakness, and confusion.  Sent here for reevaluation.

## 2012-06-11 NOTE — ED Notes (Signed)
ZOX:[id number]<AV> Expected date:<BR> Expected time:<BR> Means of arrival:<BR> Comments:<BR> EMS pt

## 2012-06-11 NOTE — ED Notes (Signed)
MD at bedside.  EDP Effie Shy present evaluating this pt

## 2012-06-11 NOTE — ED Notes (Signed)
Attempted to call report - RN to return call.  

## 2012-06-11 NOTE — ED Notes (Signed)
Attempted to ambulate pt but she was unable to walk due to trembling upon sitting and standing.  Returned pt to bed with RN assistance.

## 2012-06-11 NOTE — ED Provider Notes (Signed)
History     CSN: [id number]  Arrival date & time 06/11/12  1536   First MD Initiated Contact with Patient 06/11/12 1541      Chief Complaint  Patient presents with  . Urinary Tract Infection  . Altered Mental Status  . Weakness    (Consider location/radiation/quality/duration/timing/severity/associated sxs/prior treatment) HPI Comments: Angelica Marshall is a 79 y.o. Female who lives in a nursing care facility, and is here for evaluation of altered mental status and shakiness. Her family member states that she is more confused  for the last 3 days. The confusion is characterized by hallucinations, as well as not remembering things.  The shakiness makes it difficult to walk and hold things. When she eats now she tends to drop things on herself. She was in the ED 3 days ago with a fall injuring her head. A urine culture done at that time returned as negative for UTI. Her doctor started her on Septra, 2 days ago, for UTI. She has had this frequently in the past, when she has a UTI. There's been no cough. The patient is a poor historian.   Level V, caveat- dementia  Patient is a 79 y.o. female presenting with urinary tract infection, altered mental status, and weakness. The history is provided by the patient and a relative.  Urinary Tract Infection  Altered Mental Status  Weakness    Past Medical History  Diagnosis Date  . Hypertension   . Arthritis   . Depression   . Allergic rhinitis due to other allergen   . Other and unspecified hyperlipidemia   . Dementia, unspecified, without behavioral disturbance   . Alzheimer's disease   . Unspecified arthropathy, shoulder region   . Anorexia   . Undiagnosed cardiac murmurs   . GERD (gastroesophageal reflux disease)   . Chronic constipation     Past Surgical History  Procedure Laterality Date  . Back surgery    . Tonsillectomy      No family history on file.  History  Substance Use Topics  . Smoking status: Former Games developer  .  Smokeless tobacco: Not on file  . Alcohol Use: No    OB History   Grav Para Term Preterm Abortions TAB SAB Ect Mult Living                  Review of Systems  Neurological: Positive for weakness.  Psychiatric/Behavioral: Positive for altered mental status.    Allergies  Ciprofloxacin  Home Medications   Current Outpatient Rx  Name  Route  Sig  Dispense  Refill  . acetaminophen (TYLENOL) 500 MG tablet   Oral   Take 500 mg by mouth 3 (three) times daily.         Marland Kitchen ALPRAZolam (XANAX) 0.25 MG tablet   Oral   Take 0.25 mg by mouth 2 (two) times daily as needed for sleep.         Marland Kitchen ALPRAZolam (XANAX) 0.5 MG tablet   Oral   Take 0.5 mg by mouth at bedtime.         Marland Kitchen atorvastatin (LIPITOR) 20 MG tablet   Oral   Take 20 mg by mouth daily.           . calcium-vitamin D (OSCAL WITH D) 500-200 MG-UNIT per tablet   Oral   Take 1 tablet by mouth 2 (two) times daily.         Marland Kitchen CRANBERRY EXTRACT PO   Oral   Take 475 mg by  mouth 2 (two) times daily.          . Ensure Plus (ENSURE PLUS) LIQD   Oral   Take 237 mLs by mouth 2 (two) times daily.         Marland Kitchen esomeprazole (NEXIUM) 40 MG capsule   Oral   Take 40 mg by mouth daily before breakfast.           . gabapentin (NEURONTIN) 300 MG capsule   Oral   Take 300 mg by mouth 3 (three) times daily.         . hydrochlorothiazide (MICROZIDE) 12.5 MG capsule   Oral   Take 12.5 mg by mouth daily.         Marland Kitchen loperamide (IMODIUM) 2 MG capsule   Oral   Take 2 mg by mouth 4 (four) times daily as needed for diarrhea or loose stools.         Marland Kitchen loratadine (CLARITIN) 10 MG tablet   Oral   Take 10 mg by mouth daily.         . Melatonin 3 MG TABS   Oral   Take 1 tablet by mouth at bedtime.         . metoprolol tartrate (LOPRESSOR) 25 MG tablet   Oral   Take 12.5 mg by mouth 2 (two) times daily.         . ondansetron (ZOFRAN) 4 MG tablet   Oral   Take 4 mg by mouth every 6 (six) hours as needed for  nausea.         Marland Kitchen senna (SENOKOT) 8.6 MG TABS   Oral   Take 1 tablet by mouth 2 (two) times daily.         Marland Kitchen senna-docusate (SENOKOT-S) 8.6-50 MG per tablet   Oral   Take 1 tablet by mouth at bedtime.         . sertraline (ZOLOFT) 50 MG tablet   Oral   Take 50 mg by mouth daily.         . simethicone (MYLICON) 80 MG chewable tablet   Oral   Chew 80 mg by mouth every 6 (six) hours as needed for flatulence.          . sulfamethoxazole-trimethoprim (BACTRIM DS) 800-160 MG per tablet   Oral   Take 1 tablet by mouth 2 (two) times daily. 10 day course of therapy started 06/09/12.         . traMADol (ULTRAM) 50 MG tablet   Oral   Take 50 mg by mouth 3 (three) times daily.           BP 124/67  Pulse 83  Temp(Src) 98.6 F (37 C) (Oral)  Resp 17  Wt 140 lb (63.504 kg)  BMI 23.3 kg/m2  SpO2 93%  Physical Exam  Nursing note and vitals reviewed. Constitutional: She is oriented to person, place, and time. She appears well-developed.  Frail, elderly  HENT:  Head: Normocephalic and atraumatic.  Resolving bruise to right parietal, no swelling or fluctuance associated.  Eyes: Conjunctivae and EOM are normal. Pupils are equal, round, and reactive to light.  Neck: Normal range of motion and phonation normal. Neck supple.  Cardiovascular: Normal rate, regular rhythm and intact distal pulses.   Pulmonary/Chest: Effort normal and breath sounds normal. No respiratory distress. She has no wheezes. She has no rales. She exhibits no tenderness.  Abdominal: Soft. She exhibits no distension. There is no tenderness. There is no guarding.  Musculoskeletal: Normal  range of motion.  Neurological: She is alert and oriented to person, place, and time. She has normal strength. No cranial nerve deficit. She exhibits normal muscle tone. Coordination normal.  Normal finger to nose, bilaterally.  Skin: Skin is warm and dry.  Psychiatric: She has a normal mood and affect. Her behavior is  normal.    ED Course  Procedures (including critical care time)  Medications  0.9 %  sodium chloride infusion ( Intravenous Stopped 06/11/12 2036)    Patient Vitals for the past 24 hrs:  BP Temp Temp src Pulse Resp SpO2 Weight  06/11/12 1711 124/67 mmHg - - - 17 93 % 140 lb (63.504 kg)  06/11/12 1549 95/75 mmHg 98.6 F (37 C) Oral 83 18 95 % -  06/11/12 1537 - - - - - 95 % -    Ambulation trial: 19:17- failed     Labs Reviewed  CBC WITH DIFFERENTIAL - Abnormal; Notable for the following:    RBC 3.79 (*)    Hemoglobin 11.5 (*)    HCT 35.0 (*)    RDW 16.5 (*)    Monocytes Relative 14 (*)    Eosinophils Relative 6 (*)    All other components within normal limits  BASIC METABOLIC PANEL - Abnormal; Notable for the following:    Creatinine, Ser 1.27 (*)    GFR calc non Af Amer 35 (*)    GFR calc Af Amer 41 (*)    All other components within normal limits  URINALYSIS, ROUTINE W REFLEX MICROSCOPIC - Abnormal; Notable for the following:    Hgb urine dipstick TRACE (*)    Leukocytes, UA TRACE (*)    All other components within normal limits  URINE MICROSCOPIC-ADD ON - Abnormal; Notable for the following:    Casts HYALINE CASTS (*)    All other components within normal limits  URINE CULTURE  AMMONIA   Dg Chest 2 View  06/11/2012  *RADIOLOGY REPORT*  Clinical Data: Altered mental status, weakness and urinary tract infection.  CHEST - 2 VIEW  Comparison: 08/16/2011 and prior chest radiographs  Findings: The cardiomediastinal silhouette is stable with mild cardiomegaly. Mild interstitial prominence is unchanged. There is no evidence of focal airspace disease, pulmonary edema, suspicious pulmonary nodule/mass, pleural effusion, or pneumothorax. No acute bony abnormalities are identified. A T12 compression fracture is remote.  IMPRESSION: No evidence of acute cardiopulmonary disease.   Original Report Authenticated By: Harmon Pier, M.D.    Ct Head Wo Contrast  06/11/2012  *RADIOLOGY  REPORT*  Clinical Data: Weakness and confusion.  CT HEAD WITHOUT CONTRAST  Technique:  Contiguous axial images were obtained from the base of the skull through the vertex without contrast.  Comparison: Head CT 06/08/2012.  Findings: Cerebral and cerebellar atrophy is again noted.  Patchy and confluent areas of decreased attenuation throughout the deep and periventricular white matter of the cerebral hemispheres bilaterally is compatible with chronic microvascular ischemic disease, and is similar to the prior examination.  Right temporoparietal scalp swelling noted on the prior study has largely resolved. No acute displaced skull fractures are identified.  No acute intracranial abnormality.  Specifically, no evidence of acute post-traumatic intracranial hemorrhage, no definite regions of acute/subacute cerebral ischemia, no focal mass, mass effect, hydrocephalus or abnormal intra or extra-axial fluid collections. The visualized paranasal sinuses and mastoids are well pneumatized.  IMPRESSION: 1.  No acute intracranial abnormalities. 2.  Cerebral and cerebellar atrophy with extensive chronic microvascular ischemic changes in the cerebral white matter redemonstrated, as  above.   Original Report Authenticated By: Trudie Reed, M.D.      1. Dehydration   2. Confusion       MDM  Dehydration with confusion and weakness. She was unable to easily in the ED. She'll need to be admitted for stabilization.   Plan: Admit to hospitalist        Flint Melter, MD 06/14/12 [phone]

## 2012-06-11 NOTE — ED Notes (Signed)
Able to save IV.  Had disconnected d/t leaking.  Leaking at IV connection.  IV fluid returned.

## 2012-06-11 NOTE — H&P (Signed)
Hospitalist Admission History and Physical  Patient name: Angelica Marshall Medical record number: [id number] Date of birth: Feb 03, 1916 Age: 79 y.o. Gender: female  Primary Care Provider: MAZZOCCHI, Rise Mu, MD  Chief Complaint: AMS/encephalopathy   History of Present Illness:This is a 79 y.o. year old female with significant past medical history of HTN, dementia, recurrent falls  presenting with AMS x 4 days. Pt was seen for fall in ED approx 4 days ago. Had direct head trauma. Head CT was normal at the time. Urine cx also obtained, though pt was not treated for UTI. Urine cx with multiple bacterial morphotypes. Per, son (Angelica Marshall), pt has had hallucinations and confusion since day after fall. Son feels that this may be related to UTI as pt has had sxs like this associated with UTIs in the past. Pt was seen by PCP 2 days ago and put on bactrim for UTI. Sxs have still persisted. Pt lives at an ALF. No report of fall recurrence. No fevers, chills, vomiting or diarrhea.   In the ER, repeat head CT was obtained that was negative for any acute intracranial abnormalities. CXR WNL.  UA showed trace leukocytes, Cr at 1.27 (up from 0.8) w/ BUN/Cr ratio around 20:1.    Patient Active Problem List  Diagnosis  . HTN (hypertension)  . Vomiting  . Gastroenteritis, acute  . Dehydration  . Hypokalemia  . Constipation  . Depression  . Hyperlipidemia  . Anxiety  . Nontoxic multinodular goiter  . Nontoxic uninodular goiter  . Compression fracture of T12 vertebra  . Back pain  . Weakness generalized  . Nausea and vomiting  . Falls frequently   Past Medical History: Past Medical History  Diagnosis Date  . Hypertension   . Arthritis   . Depression   . Allergic rhinitis due to other allergen   . Other and unspecified hyperlipidemia   . Dementia, unspecified, without behavioral disturbance   . Alzheimer's disease   . Unspecified arthropathy, shoulder region   . Anorexia   . Undiagnosed cardiac  murmurs   . GERD (gastroesophageal reflux disease)   . Chronic constipation     Past Surgical History: Past Surgical History  Procedure Laterality Date  . Back surgery    . Tonsillectomy      Social History: History   Social History  . Marital Status: Divorced    Spouse Name: N/A    Number of Children: N/A  . Years of Education: N/A   Social History Main Topics  . Smoking status: Former Games developer  . Smokeless tobacco: None  . Alcohol Use: No  . Drug Use: No  . Sexually Active: No   Other Topics Concern  . None   Social History Narrative  . None    Family History: No family history on file.  Allergies: Allergies  Allergen Reactions  . Ciprofloxacin     Unknown    Current Facility-Administered Medications  Medication Dose Route Frequency Provider Last Rate Last Dose  . 0.9 %  sodium chloride infusion   Intravenous Continuous Flint Melter, MD      . 0.9 %  sodium chloride infusion   Intravenous Continuous Doree Albee, MD      . acetaminophen (TYLENOL) tablet 500 mg  500 mg Oral TID Doree Albee, MD      . atorvastatin (LIPITOR) tablet 20 mg  20 mg Oral Daily Doree Albee, MD      . calcium-vitamin D (OSCAL WITH D) 500-200 MG-UNIT per tablet 1 tablet  1  tablet Oral BID Doree Albee, MD      . cefTRIAXone (ROCEPHIN) 1 g in dextrose 5 % 50 mL IVPB  1 g Intravenous Q24H Doree Albee, MD      . Ensure Plus (ENSURE PLUS) liquid 237 mL  237 mL Oral BID Doree Albee, MD      . gabapentin (NEURONTIN) capsule 300 mg  300 mg Oral TID Doree Albee, MD      . heparin injection 5,000 Units  5,000 Units Subcutaneous Q8H Doree Albee, MD      . loperamide (IMODIUM) capsule 2 mg  2 mg Oral QID PRN Doree Albee, MD      . metoprolol tartrate (LOPRESSOR) tablet 12.5 mg  12.5 mg Oral BID Doree Albee, MD      . ondansetron [facility]) tablet 4 mg  4 mg Oral Q6H PRN Doree Albee, MD      . Melene Muller ON 06/12/2012] pantoprazole (PROTONIX) EC tablet 80 mg  80 mg Oral Q1200  Doree Albee, MD      . Gwyndolyn Kaufman [facility]) tablet 8.6 mg  1 tablet Oral BID Doree Albee, MD      . senna-docusate (Senokot-S) tablet 1 tablet  1 tablet Oral QHS Doree Albee, MD      . sertraline (ZOLOFT) tablet 50 mg  50 mg Oral Daily Doree Albee, MD      . sodium chloride 0.9 % injection 3 mL  3 mL Intravenous Q12H Doree Albee, MD      . traMADol Janean Sark) tablet 50 mg  50 mg Oral TID Doree Albee, MD       Current Outpatient Prescriptions  Medication Sig Dispense Refill  . acetaminophen (TYLENOL) 500 MG tablet Take 500 mg by mouth 3 (three) times daily.      Marland Kitchen ALPRAZolam (XANAX) 0.25 MG tablet Take 0.25 mg by mouth 2 (two) times daily as needed for sleep.      Marland Kitchen ALPRAZolam (XANAX) 0.5 MG tablet Take 0.5 mg by mouth at bedtime.      Marland Kitchen atorvastatin (LIPITOR) 20 MG tablet Take 20 mg by mouth daily.        . calcium-vitamin D (OSCAL WITH D) 500-200 MG-UNIT per tablet Take 1 tablet by mouth 2 (two) times daily.      Marland Kitchen CRANBERRY EXTRACT PO Take 475 mg by mouth 2 (two) times daily.       . Ensure Plus (ENSURE PLUS) LIQD Take 237 mLs by mouth 2 (two) times daily.      Marland Kitchen esomeprazole (NEXIUM) 40 MG capsule Take 40 mg by mouth daily before breakfast.        . gabapentin (NEURONTIN) 300 MG capsule Take 300 mg by mouth 3 (three) times daily.      . hydrochlorothiazide (MICROZIDE) 12.5 MG capsule Take 12.5 mg by mouth daily.      Marland Kitchen loperamide (IMODIUM) 2 MG capsule Take 2 mg by mouth 4 (four) times daily as needed for diarrhea or loose stools.      Marland Kitchen loratadine (CLARITIN) 10 MG tablet Take 10 mg by mouth daily.      . Melatonin 3 MG TABS Take 1 tablet by mouth at bedtime.      . metoprolol tartrate (LOPRESSOR) 25 MG tablet Take 12.5 mg by mouth 2 (two) times daily.      . ondansetron (ZOFRAN) 4 MG tablet Take 4 mg by mouth every 6 (six) hours as needed for nausea.      Marland Kitchen senna (SENOKOT) 8.6 MG TABS Take  1 tablet by mouth 2 (two) times daily.      Marland Kitchen senna-docusate (SENOKOT-S) 8.6-50 MG per tablet  Take 1 tablet by mouth at bedtime.      . sertraline (ZOLOFT) 50 MG tablet Take 50 mg by mouth daily.      . simethicone (MYLICON) 80 MG chewable tablet Chew 80 mg by mouth every 6 (six) hours as needed for flatulence.       . sulfamethoxazole-trimethoprim (BACTRIM DS) 800-160 MG per tablet Take 1 tablet by mouth 2 (two) times daily. 10 day course of therapy started 06/09/12.      . traMADol (ULTRAM) 50 MG tablet Take 50 mg by mouth 3 (three) times daily.       Review Of Systems: 12 point ROS negative except as noted above in HPI.  Physical Exam: Filed Vitals:   06/11/12 1711  BP: 124/67  Pulse:   Temp:   Resp: 17    General: alert, cooperative and pleasantly demented HEENT: PERRLA, extra ocular movement intact and dry oral mucous membranes  Heart: S1, S2 normal, no murmur, rub or gallop, regular rate and rhythm Lungs: clear to auscultation, no wheezes or rales and unlabored breathing Abdomen: abdomen is soft. No masses, organomegaly or guarding, mild suprapubic tenderness Extremities: extremities normal, atraumatic, no cyanosis or edema Skin:Mild bruising on R posterior scalp and R forearm.  Neurology: normal without focal findings and oriented to person and place   Labs and Imaging: Lab Results  Component Value Date/Time   NA 139 06/11/2012  5:00 PM   K 4.0 06/11/2012  5:00 PM   CL 102 06/11/2012  5:00 PM   CO2 29 06/11/2012  5:00 PM   BUN 21 06/11/2012  5:00 PM   CREATININE 1.27* 06/11/2012  5:00 PM   GLUCOSE 99 06/11/2012  5:00 PM   Lab Results  Component Value Date   WBC 4.9 06/11/2012   HGB 11.5* 06/11/2012   HCT 35.0* 06/11/2012   MCV 92.3 06/11/2012   PLT 328 06/11/2012   Urinalysis    Component Value Date/Time   COLORURINE YELLOW 06/11/2012 1701   APPEARANCEUR CLEAR 06/11/2012 1701   LABSPEC 1.017 06/11/2012 1701   PHURINE 7.0 06/11/2012 1701   GLUCOSEU NEGATIVE 06/11/2012 1701   HGBUR TRACE* 06/11/2012 1701   BILIRUBINUR NEGATIVE 06/11/2012 1701   KETONESUR NEGATIVE  06/11/2012 1701   PROTEINUR NEGATIVE 06/11/2012 1701   UROBILINOGEN 1.0 06/11/2012 1701   NITRITE NEGATIVE 06/11/2012 1701   LEUKOCYTESUR TRACE* 06/11/2012 1701       Dg Chest 2 View  06/11/2012  *RADIOLOGY REPORT*  Clinical Data: Altered mental status, weakness and urinary tract infection.  CHEST - 2 VIEW  Comparison: 08/16/2011 and prior chest radiographs  Findings: The cardiomediastinal silhouette is stable with mild cardiomegaly. Mild interstitial prominence is unchanged. There is no evidence of focal airspace disease, pulmonary edema, suspicious pulmonary nodule/mass, pleural effusion, or pneumothorax. No acute bony abnormalities are identified. A T12 compression fracture is remote.  IMPRESSION: No evidence of acute cardiopulmonary disease.   Original Report Authenticated By: Harmon Pier, M.D.    Ct Head Wo Contrast  06/11/2012  *RADIOLOGY REPORT*  Clinical Data: Weakness and confusion.  CT HEAD WITHOUT CONTRAST  Technique:  Contiguous axial images were obtained from the base of the skull through the vertex without contrast.  Comparison: Head CT 06/08/2012.  Findings: Cerebral and cerebellar atrophy is again noted.  Patchy and confluent areas of decreased attenuation throughout the deep and periventricular white matter of the  cerebral hemispheres bilaterally is compatible with chronic microvascular ischemic disease, and is similar to the prior examination.  Right temporoparietal scalp swelling noted on the prior study has largely resolved. No acute displaced skull fractures are identified.  No acute intracranial abnormality.  Specifically, no evidence of acute post-traumatic intracranial hemorrhage, no definite regions of acute/subacute cerebral ischemia, no focal mass, mass effect, hydrocephalus or abnormal intra or extra-axial fluid collections. The visualized paranasal sinuses and mastoids are well pneumatized.  IMPRESSION: 1.  No acute intracranial abnormalities. 2.  Cerebral and cerebellar atrophy  with extensive chronic microvascular ischemic changes in the cerebral white matter redemonstrated, as above.   Original Report Authenticated By: Trudie Reed, M.D.      Assessment and Plan: Voncille Simm is a 79 y.o. year old female presenting with encephalopathy, dehydration, concern for UTI  Encephalopathy: likely acute decompensation of dementia. Higher up on the differential includes dehydration, post concussive symptoms given recent fall and UTI. Will hydrate pt with normal saline. Gentle hydration given age. Continue to follow sxs. Will treat for UTI w/ rocephin. D/c outpatient bactrim. Urine culture. No prior history of liver disease.   Dehydration: Noted AKI. Likely prerenal. BUN/Cr ratio near 20:1. Gentle hydration. Holding thiazide diuretic. Recheck Cr in am.   Dementia: Acute decompensation as noted above. Waxing and waning clinically, though sxs fairly mild. Will continue to follow clinically.   HTN: Hemodynamically stable. Continue beta blocker. Hold diuretic.   Psych: continue SSRI.    FEN/GI: heart healthy diet. PPI.  Prophylaxis: subq heparin.  Disposition: pending further evaluation.  Code Status:DNR        Doree Albee MD  Pager: [phone]

## 2012-06-12 DIAGNOSIS — F411 Generalized anxiety disorder: Secondary | ICD-10-CM

## 2012-06-12 DIAGNOSIS — I10 Essential (primary) hypertension: Secondary | ICD-10-CM

## 2012-06-12 DIAGNOSIS — F0280 Dementia in other diseases classified elsewhere without behavioral disturbance: Secondary | ICD-10-CM | POA: Diagnosis present

## 2012-06-12 DIAGNOSIS — N390 Urinary tract infection, site not specified: Secondary | ICD-10-CM | POA: Diagnosis present

## 2012-06-12 DIAGNOSIS — E860 Dehydration: Secondary | ICD-10-CM

## 2012-06-12 DIAGNOSIS — G9341 Metabolic encephalopathy: Secondary | ICD-10-CM

## 2012-06-12 LAB — BLOOD GAS, ARTERIAL
Bicarbonate: 26.4 mEq/L — ABNORMAL HIGH (ref 20.0–24.0)
TCO2: 24 mmol/L (ref 0–100)
pCO2 arterial: 41.4 mmHg (ref 35.0–45.0)
pH, Arterial: 7.421 (ref 7.350–7.450)
pO2, Arterial: 67.3 mmHg — ABNORMAL LOW (ref 80.0–100.0)

## 2012-06-12 LAB — COMPREHENSIVE METABOLIC PANEL
AST: 14 U/L (ref 0–37)
Albumin: 2.7 g/dL — ABNORMAL LOW (ref 3.5–5.2)
Calcium: 8.7 mg/dL (ref 8.4–10.5)
Creatinine, Ser: 1.08 mg/dL (ref 0.50–1.10)
Sodium: 139 mEq/L (ref 135–145)

## 2012-06-12 LAB — URINE CULTURE

## 2012-06-12 LAB — MRSA PCR SCREENING: MRSA by PCR: NEGATIVE

## 2012-06-12 LAB — CBC
HCT: 33.2 % — ABNORMAL LOW (ref 36.0–46.0)
Hemoglobin: 10.8 g/dL — ABNORMAL LOW (ref 12.0–15.0)
MCH: 29.9 pg (ref 26.0–34.0)
MCV: 92 fL (ref 78.0–100.0)
RBC: 3.61 MIL/uL — ABNORMAL LOW (ref 3.87–5.11)

## 2012-06-12 MED ORDER — HALOPERIDOL LACTATE 5 MG/ML IJ SOLN
1.0000 mg | Freq: Four times a day (QID) | INTRAMUSCULAR | Status: DC | PRN
  Administered 2012-06-13: 1 mg via INTRAVENOUS
  Filled 2012-06-12: qty 1

## 2012-06-12 MED ORDER — SODIUM CHLORIDE 0.9 % IV SOLN
INTRAVENOUS | Status: AC
  Administered 2012-06-12 – 2012-06-13 (×2): via INTRAVENOUS

## 2012-06-12 NOTE — Progress Notes (Signed)
   CARE MANAGEMENT NOTE 06/12/2012  Patient:  Angelica Marshall, Angelica Marshall   Account Number:  [id number]  Date Initiated:  06/12/2012  Documentation initiated by:  Jiles Crocker  Subjective/Objective Assessment:   ADMITTED WITH UTI, WEAKNESS     Action/Plan:   PATIENT RESIDES IN A FACILITY; SOC WORKER REFERRAL PLACED; CM FOLLOWING FOR DCP   Anticipated DC Date:  06/15/2012   Anticipated DC Plan:  SKILLED NURSING FACILITY  In-house referral  Clinical Social Worker      DC Planning Services  CM consult         Status of service:  In process, will continue to follow Medicare Important Message given?  NA - LOS <3 / Initial given by admissions (If response is "NO", the following Medicare IM given date fields will be blank) Per UR Regulation:  Reviewed for med. necessity/level of care/duration of stay Comments:  06/12/2012- B Westyn Driggers RN,BSN,MHA

## 2012-06-12 NOTE — Evaluation (Signed)
Physical Therapy Evaluation Patient Details Name: Angelica Marshall MRN: [id number] DOB: [birth year] Today's Date: 06/12/2012 Time: 4098-1191 PT Time Calculation (min): 15 min  PT Assessment / Plan / Recommendation Clinical Impression  Pt is a 79 year old female admitted from ALF for metabolic encephalopathy due to UTI.  Pt would benefit from acute PT services in order to improve independence with transfers and ambulation to prepare for d/c to next venue.  Recommend at least mod assist be available at ALF upon d/c otherwise pt may need ST-SNF.      PT Assessment  Patient needs continued PT services    Follow Up Recommendations  Home health PT;Supervision/Assistance - 24 hour (HHPT if assist available at ALF otherwise SNF)    Does the patient have the potential to tolerate intense rehabilitation      Barriers to Discharge        Equipment Recommendations  None recommended by PT    Recommendations for Other Services     Frequency Min 3X/week    Precautions / Restrictions Precautions Precautions: Fall   Pertinent Vitals/Pain n/a      Mobility  Bed Mobility Bed Mobility: Supine to Sit Supine to Sit: 5: Supervision Details for Bed Mobility Assistance: verbal cues for technique due to confusion Transfers Transfers: Sit to Stand;Stand to Sit Sit to Stand: 4: Min assist;With upper extremity assist;From bed Stand to Sit: 4: Min assist;With upper extremity assist;To bed Details for Transfer Assistance: verbal cues for safe technique, assist for weakness and posterior lean upon standing Ambulation/Gait Ambulation/Gait Assistance: 3: Mod assist Ambulation Distance (Feet): 120 Feet Assistive device: Rolling walker Ambulation/Gait Assistance Details: assist for weakness and balance as well as navigating RW, verbal cues for RW distance and safe use of RW Gait Pattern: Decreased stride length;Step-through pattern Gait velocity: decreased    Exercises     PT Diagnosis: Difficulty  walking;Generalized weakness  PT Problem List: Decreased strength;Decreased activity tolerance;Decreased balance;Decreased mobility;Decreased knowledge of use of DME;Decreased safety awareness;Decreased cognition PT Treatment Interventions: DME instruction;Gait training;Functional mobility training;Patient/family education;Therapeutic activities;Therapeutic exercise;Balance training;Neuromuscular re-education   PT Goals Acute Rehab PT Goals PT Goal Formulation: With patient Time For Goal Achievement: 06/19/12 Potential to Achieve Goals: Good Pt will go Sit to Stand: with supervision PT Goal: Sit to Stand - Progress: Goal set today Pt will go Stand to Sit: with supervision PT Goal: Stand to Sit - Progress: Goal set today Pt will Ambulate: 51 - 150 feet;with supervision;with rolling walker PT Goal: Ambulate - Progress: Goal set today Pt will Perform Home Exercise Program: with supervision, verbal cues required/provided PT Goal: Perform Home Exercise Program - Progress: Goal set today  Visit Information  Last PT Received On: 06/12/12 Assistance Needed: +1    Subjective Data  Subjective: I'm trying to find my find, maybe I left it.   Prior Functioning  Home Living Type of Home: Assisted living Home Adaptive Equipment: Walker - rolling Prior Function Level of Independence: Needs assistance;Independent with assistive device(s) Needs Assistance: Bathing Comments: RN reports pt from ALF and only requires assist for bathing, otherwise ambulatory with RW (per son) Communication Communication: No difficulties    Cognition  Cognition Arousal/Alertness: Awake/alert Behavior During Therapy: Restless Overall Cognitive Status: Impaired/Different from baseline Area of Impairment: Orientation Orientation Level: Disoriented to;Place;Time;Situation General Comments: per son pt gets confused when she has UTIs and he states this presentation is same, also hallucinates    Extremity/Trunk  Assessment Right Upper Extremity Assessment RUE ROM/Strength/Tone: [facility] for tasks assessed Left Upper Extremity  Assessment LUE ROM/Strength/Tone: WFL for tasks assessed Right Lower Extremity Assessment RLE ROM/Strength/Tone: Deficits RLE ROM/Strength/Tone Deficits: functional weakness observed Left Lower Extremity Assessment LLE ROM/Strength/Tone: Deficits LLE ROM/Strength/Tone Deficits: functional weakness observed   Balance Balance Balance Assessed: Yes Static Standing Balance Static Standing - Balance Support: Bilateral upper extremity supported Static Standing - Level of Assistance: 4: Min assist Static Standing - Comment/# of Minutes: min to min/guard assist standing in front of bed prior to ambulation (tends to posterior lean but able to correct with cues)  End of Session PT - End of Session Equipment Utilized During Treatment: Gait belt Activity Tolerance: Patient tolerated treatment well Patient left: in bed;with call bell/phone within reach;with bed alarm set;with family/visitor present Nurse Communication: Mobility status (saw pt ambulate in hallway, aware son in room )  GP     Luara Faye,KATHrine E 06/12/2012, 4:20 PM Zenovia Jarred, PT, DPT 06/12/2012 Pager: [phone]

## 2012-06-12 NOTE — Progress Notes (Signed)
[facility]                                                                                Patient Demographics  Angelica Marshall, is a 79 y.o. female, DOB - 1916/02/15, MVH:[id number], [id number]:[id number]  Admit date - 06/11/2012  Admitting Physician Doree Albee, MD  Outpatient Primary MD for the patient is MAZZOCCHI, Rise Mu, MD  LOS - 1   Chief Complaint  Patient presents with  . Urinary Tract Infection  . Altered Mental Status  . Weakness        Assessment & Plan    1.  UTI with metabolic encephalopathy - continue Rocephin, follow cultures, fall aspiration precautions, feeding assistance.   2. Underlying dementia. Outpatient followup with PCP   3. Mild dehydration continue gentle IV fluids.   4. Hypertension. Lopressor home dose as tolerated.    5. GERD and dyslipidemia. Continue home dose statin and PPI.     Code Status: DNR  Family Communication: son  Disposition Plan: TBD   Procedures CT head, CXR   Consults  PT   DVT Prophylaxis   Heparin    Lab Results  Component Value Date   PLT 296 06/12/2012    Medications  Scheduled Meds: . acetaminophen  500 mg Oral TID  . atorvastatin  20 mg Oral Daily  . calcium-vitamin D  1 tablet Oral BID  . cefTRIAXone (ROCEPHIN)  IV  1 g Intravenous Q24H  . Ensure Plus  237 mL Oral BID  . gabapentin  300 mg Oral TID  . heparin  5,000 Units Subcutaneous Q8H  . metoprolol tartrate  12.5 mg Oral BID  . pantoprazole  80 mg Oral Q1200  . senna  1 tablet Oral BID  . senna-docusate  1 tablet Oral QHS  . sertraline  50 mg Oral Daily  . sodium chloride  3 mL Intravenous Q12H  . traMADol  50 mg Oral TID   Continuous Infusions: . sodium chloride     PRN Meds:.haloperidol lactate, loperamide, ondansetron  Antibiotics     Anti-infectives   Start     Dose/Rate Route Frequency Ordered Stop   06/11/12 2130  cefTRIAXone (ROCEPHIN) 1 g in dextrose 5 % 50 mL IVPB     1 g 100 mL/hr over 30 Minutes Intravenous  Every 24 hours 06/11/12 2115         Time Spent in minutes  35   SINGH,PRASHANT K M.D on 06/12/2012 at 10:27 AM  Between 7am to 7pm - Pager - [phone]  After 7pm go to [email or web address] - password [email or web address]  And look for the night coverage person covering for me after hours  [facility] Office  [phone]    Subjective:   Angelica Marshall today has, No headache, No chest pain, No abdominal pain - No Nausea, No new weakness tingling or numbness, No Cough - SOB.    Objective:   Filed Vitals:   06/11/12 2240 06/11/12 2329 06/12/12 0500 06/12/12 0704  BP:  148/72  136/71  Pulse:  95  77  Temp:  98.5 F (36.9 C)  98.1 F (36.7 C)  TempSrc:  Oral  Oral  Resp:  16  16  Height: 5\' 5"  (1.651 m)     Weight: 60.7 kg (133 lb 13.1 oz)  60.7 kg (133 lb 13.1 oz)   SpO2:  91%  93%    Wt Readings from Last 3 Encounters:  06/12/12 60.7 kg (133 lb 13.1 oz)  08/16/11 57.2 kg (126 lb 1.7 oz)  04/01/11 59.966 kg (132 lb 3.2 oz)     Intake/Output Summary (Last 24 hours) at 06/12/12 1027 Last data filed at 06/12/12 1017  Gross per 24 hour  Intake 881.25 ml  Output      0 ml  Net 881.25 ml    Exam Awake , doesn't he confused oriented x 1, No new F.N deficits, Normal affect .AT,PERRAL Supple Neck,No JVD, No cervical lymphadenopathy appriciated.  Symmetrical Chest wall movement, Good air movement bilaterally, CTAB RRR,No Gallops,Rubs or new Murmurs, No Parasternal Heave +ve B.Sounds, Abd Soft, Non tender, No organomegaly appriciated, No rebound - guarding or rigidity. No Cyanosis, Clubbing or edema, No new Rash or bruise     Data Review   Micro Results Recent Results (from the past 240 hour(s))  URINE CULTURE     Status: None   Collection Time    06/08/12 10:49 PM      Result Value Range Status   Specimen Description URINE, CLEAN CATCH   Final   Special Requests NONE   Final   Culture  Setup Time 06/09/2012 08:47   Final   Colony Count 15,000 COLONIES/ML    Final   Culture     Final   Value: Multiple bacterial morphotypes present, none predominant. Suggest appropriate recollection if clinically indicated.   Report Status 06/10/2012 FINAL   Final  MRSA PCR SCREENING     Status: None   Collection Time    06/12/12 12:05 AM      Result Value Range Status   MRSA by PCR NEGATIVE  NEGATIVE Final   Comment:            The GeneXpert MRSA Assay (FDA     approved for NASAL specimens     only), is one component of a     comprehensive MRSA colonization     surveillance program. It is not     intended to diagnose MRSA     infection nor to guide or     monitor treatment for     MRSA infections.    Radiology Reports Dg Chest 2 View  06/11/2012  *RADIOLOGY REPORT*  Clinical Data: Altered mental status, weakness and urinary tract infection.  CHEST - 2 VIEW  Comparison: 08/16/2011 and prior chest radiographs  Findings: The cardiomediastinal silhouette is stable with mild cardiomegaly. Mild interstitial prominence is unchanged. There is no evidence of focal airspace disease, pulmonary edema, suspicious pulmonary nodule/mass, pleural effusion, or pneumothorax. No acute bony abnormalities are identified. A T12 compression fracture is remote.  IMPRESSION: No evidence of acute cardiopulmonary disease.   Original Report Authenticated By: Harmon Pier, M.D.    Dg Elbow Complete Right  06/08/2012  *RADIOLOGY REPORT*  Clinical Data: Fall with right elbow pain and injury.  RIGHT ELBOW - COMPLETE 3+ VIEW  Comparison:  None.  Findings:  There is no evidence of fracture, dislocation, or joint effusion.  There is no evidence of arthropathy or other focal bone abnormality.  Bones are osteopenic.  Soft tissues are unremarkable. No joint effusion is identified.  IMPRESSION: No acute fracture.   Original Report Authenticated By: Irish Lack, M.D.    Ct Head Wo Contrast  06/11/2012  *RADIOLOGY REPORT*  Clinical Data: Weakness and confusion.  CT HEAD WITHOUT CONTRAST   Technique:  Contiguous axial images were obtained from the base of the skull through the vertex without contrast.  Comparison: Head CT 06/08/2012.  Findings: Cerebral and cerebellar atrophy is again noted.  Patchy and confluent areas of decreased attenuation throughout the deep and periventricular white matter of the cerebral hemispheres bilaterally is compatible with chronic microvascular ischemic disease, and is similar to the prior examination.  Right temporoparietal scalp swelling noted on the prior study has largely resolved. No acute displaced skull fractures are identified.  No acute intracranial abnormality.  Specifically, no evidence of acute post-traumatic intracranial hemorrhage, no definite regions of acute/subacute cerebral ischemia, no focal mass, mass effect, hydrocephalus or abnormal intra or extra-axial fluid collections. The visualized paranasal sinuses and mastoids are well pneumatized.  IMPRESSION: 1.  No acute intracranial abnormalities. 2.  Cerebral and cerebellar atrophy with extensive chronic microvascular ischemic changes in the cerebral white matter redemonstrated, as above.   Original Report Authenticated By: Trudie Reed, M.D.    Ct Head Wo Contrast  06/08/2012  *RADIOLOGY REPORT*  Clinical Data: Unwitnessed fall, hitting head.  History of dementia.  No loss of consciousness.  CT HEAD WITHOUT CONTRAST  Technique:  Contiguous axial images were obtained from the base of the skull through the vertex without contrast.  Comparison: 08/14/2011.  Findings: There is no evidence for acute infarction, intracranial hemorrhage, mass lesion, hydrocephalus, or extra-axial fluid. Moderate atrophy.  Extensive chronic microvascular ischemic change. Bilateral cataract extraction.  Moderate sized right temporoparietal scalp hematoma with subcutaneous air suggesting laceration.  There is no visible underlying skull fracture.  There is no adjacent subdural hematoma. No contrecoup injury is identified.   There is advanced vascular calcification.  The sinuses and mastoids are clear.  Compared with priors, a similar appearance is noted.  IMPRESSION: Right temporoparietal scalp laceration and hematoma.  No skull fracture or intracranial hemorrhage.  Atrophy and chronic microvascular ischemic change appears stable.   Original Report Authenticated By: Davonna Belling, M.D.     CBC  Recent Labs Lab 06/08/12 2005 06/11/12 1700 06/12/12 0455  WBC 6.2 4.9 3.9*  HGB 11.1* 11.5* 10.8*  HCT 34.1* 35.0* 33.2*  PLT 297 328 296  MCV 92.7 92.3 92.0  MCH 30.2 30.3 29.9  MCHC 32.6 32.9 32.5  RDW 16.2* 16.5* 16.4*  LYMPHSABS 1.2 1.1  --   MONOABS 0.6 0.7  --   EOSABS 0.2 0.3  --   BASOSABS 0.1 0.0  --     Chemistries   Recent Labs Lab 06/08/12 2005 06/11/12 1700 06/12/12 0455  NA 138 139 139  K 3.7 4.0 4.0  CL 101 102 106  CO2 27 29 27   GLUCOSE 148* 99 89  BUN 19 21 18   CREATININE 0.86 1.27* 1.08  CALCIUM 9.1 9.0 8.7  AST 14  --  14  ALT 10  --  7  ALKPHOS 90  --  88  BILITOT 0.2*  --  0.2*   ------------------------------------------------------------------------------------------------------------------ estimated creatinine clearance is 28.7 ml/min (by C-G formula based on Cr of 1.08). ------------------------------------------------------------------------------------------------------------------ No results found for this basename: HGBA1C,  in the last 72 hours ------------------------------------------------------------------------------------------------------------------ No results found for this basename: CHOL, HDL, LDLCALC, TRIG, CHOLHDL, LDLDIRECT,  in the last 72 hours ------------------------------------------------------------------------------------------------------------------ No results found for this basename: TSH, T4TOTAL, FREET3, T3FREE, THYROIDAB,  in the last 72  hours ------------------------------------------------------------------------------------------------------------------ No results found for this basename: VITAMINB12, FOLATE, FERRITIN, TIBC,  IRON, RETICCTPCT,  in the last 72 hours  Coagulation profile  Recent Labs Lab 06/08/12 2005  INR 1.05    No results found for this basename: DDIMER,  in the last 72 hours  Cardiac Enzymes  Recent Labs Lab 06/08/12 2005  TROPONINI <0.30   ------------------------------------------------------------------------------------------------------------------ No components found with this basename: POCBNP,

## 2012-06-12 NOTE — Progress Notes (Signed)
Nutrition Brief Note  Patient identified on the Malnutrition Screening Tool (MST) Report  Body mass index is 22.27 kg/(m^2). Patient meets criteria for normal weight based on current BMI.   Current diet order is regular, patient is consuming approximately 100% of meals at this time. Labs and medications reviewed. Pt with dementia on top of altered mental status. No family present. RN reports pt ate all of her lunch and drank 100% of Ensure for breakfast as her son states she typically does not eat breakfast but drinks Ensure. Weight has been stable.   No nutrition interventions warranted at this time. If nutrition issues arise, please consult RD.   Levon Hedger MS, RD, LDN [phone] Pager [phone] After Hours Pager

## 2012-06-12 NOTE — Progress Notes (Signed)
Clinical Social Work Department BRIEF PSYCHOSOCIAL ASSESSMENT 06/12/2012  Patient:  Angelica Marshall, Angelica Marshall     Account Number:  [id number]     Admit date:  06/11/2012  Clinical Social Worker:  Orpah Greek  Date/Time:  06/12/2012 11:19 AM  Referred by:  Physician  Date Referred:  06/12/2012 Referred for  Other - See comment   Other Referral:   Admitted from: [facility]   Interview type:  Patient Other interview type:   and son, Angelica Marshall    PSYCHOSOCIAL DATA Living Status:  FACILITY Admitted from facility:  [facility] Level of care:  Assisted Living Primary support name:  Angelica Marshall (son) cell#: [phone] Primary support relationship to patient:  CHILD, ADULT Degree of support available:   good    CURRENT CONCERNS Current Concerns  Post-Acute Placement   Other Concerns:    SOCIAL WORK ASSESSMENT / PLAN CSW spoke with patient & son, Angelica Marshall at bedside re: discharge planning. Patient was admitted from [facility]F, awaiting PT evaluation for discharge disposition.   Assessment/plan status:  Information/Referral to [facility] Other assessment/ plan:   Information/referral to community resources:   CSW provided patient's son with SNF list should PT recommend that she go to rehab before returning to [facility].    PATIENT'S/FAMILY'S RESPONSE TO PLAN OF CARE: Son states that patient had been to [facility] several times in the past & would like for her to go there if SNF is needed.       Angelica Bailey, LCSW [facility] Clinical Social Worker cell #: [phone]

## 2012-06-13 DIAGNOSIS — N390 Urinary tract infection, site not specified: Principal | ICD-10-CM

## 2012-06-13 DIAGNOSIS — M549 Dorsalgia, unspecified: Secondary | ICD-10-CM

## 2012-06-13 LAB — BASIC METABOLIC PANEL
CO2: 28 mEq/L (ref 19–32)
Calcium: 8.9 mg/dL (ref 8.4–10.5)
Sodium: 141 mEq/L (ref 135–145)

## 2012-06-13 LAB — MAGNESIUM: Magnesium: 2.3 mg/dL (ref 1.5–2.5)

## 2012-06-13 MED ORDER — ALPRAZOLAM 0.5 MG PO TABS
0.5000 mg | ORAL_TABLET | Freq: Every day | ORAL | Status: DC
  Administered 2012-06-14: 0.5 mg via ORAL
  Filled 2012-06-13 (×2): qty 1

## 2012-06-13 MED ORDER — ALPRAZOLAM 0.25 MG PO TABS
0.2500 mg | ORAL_TABLET | Freq: Two times a day (BID) | ORAL | Status: DC | PRN
  Administered 2012-06-13 – 2012-06-15 (×3): 0.25 mg via ORAL
  Filled 2012-06-13 (×3): qty 1

## 2012-06-13 NOTE — Progress Notes (Signed)
[facility]                                                                                Patient Demographics  Angelica Marshall, is a 79 y.o. female, DOB - 10/12/1915, JXB:[id number], [id number]:[id number]  Admit date - 06/11/2012  Admitting Physician Doree Albee, MD  Outpatient Primary MD for the patient is MAZZOCCHI, Rise Mu, MD  LOS - 2   Chief Complaint  Patient presents with  . Urinary Tract Infection  . Altered Mental Status  . Weakness        Assessment & Plan   Brief summary   This is a pleasant 79 year old Caucasian female who lives in a assisted living facility with early dementia, hypertension was brought in for encephalopathy and confusion secondary to UTI 2 days ago, detailed discussion with her son were made bedside, cording to her son this is a routine pattern for the patient that she gets mild UTIs and generally responds to antibiotics in 2-3 days and goes back to her baseline, he is eager that at least 3 days of antibiotics be given to the patient, she is being evaluated by physical therapy, son wants evaluation for SNF placement also depending how she is doing after 3 days of antibiotics.     Assessment and plan    1.  UTI with metabolic encephalopathy - continue Rocephin, follow cultures, fall aspiration precautions, feeding assistance. PT evaluation and evaluation for placement.    2. Underlying dementia. Outpatient followup with PCP    3. Mild dehydration continue gentle IV fluids.    4. Hypertension. Lopressor home dose as tolerated.    5. GERD and dyslipidemia. Continue home dose statin and PPI.     Code Status: DNR  Family Communication: son  Disposition Plan: TBD   Procedures CT head, CXR   Consults  PT   DVT Prophylaxis   Heparin    Lab Results  Component Value Date   PLT 296 06/12/2012    Medications  Scheduled Meds: . acetaminophen  500 mg Oral TID  . atorvastatin  20 mg Oral Daily  . calcium-vitamin D  1  tablet Oral BID  . cefTRIAXone (ROCEPHIN)  IV  1 g Intravenous Q24H  . feeding supplement  237 mL Oral BID  . gabapentin  300 mg Oral TID  . heparin  5,000 Units Subcutaneous Q8H  . metoprolol tartrate  12.5 mg Oral BID  . pantoprazole  80 mg Oral Q1200  . senna  1 tablet Oral BID  . senna-docusate  1 tablet Oral QHS  . sertraline  50 mg Oral Daily  . sodium chloride  3 mL Intravenous Q12H  . traMADol  50 mg Oral TID   Continuous Infusions:   PRN Meds:.haloperidol lactate, loperamide, ondansetron  Antibiotics     Anti-infectives   Start     Dose/Rate Route Frequency Ordered Stop   06/11/12 2130  cefTRIAXone (ROCEPHIN) 1 g in dextrose 5 % 50 mL IVPB     1 g 100 mL/hr over 30 Minutes Intravenous Every 24 hours 06/11/12 2115         Time Spent in minutes  35   Susa Raring K M.D on 06/13/2012  at 11:40 AM  Between 7am to 7pm - Pager - [phone]  After 7pm go to [email or web address] - password [email or web address]  And look for the night coverage person covering for me after hours  [facility] Office  [phone]    Subjective:   Angelica Marshall today has, No headache, No chest pain, No abdominal pain - No Nausea, No new weakness tingling or numbness, No Cough - SOB.  She is pleasantly confused but answering questions.  Objective:   Filed Vitals:   06/12/12 1400 06/12/12 2110 06/13/12 0500 06/13/12 0630  BP: 127/65 130/82  147/91  Pulse: 78 81  78  Temp: 98.3 F (36.8 C) 98.6 F (37 C)  98 F (36.7 C)  TempSrc: Oral Oral  Oral  Resp: 18 18  18   Height:      Weight:   59.7 kg (131 lb 9.8 oz)   SpO2: 95% 97%  96%    Wt Readings from Last 3 Encounters:  06/13/12 59.7 kg (131 lb 9.8 oz)  08/16/11 57.2 kg (126 lb 1.7 oz)  04/01/11 59.966 kg (132 lb 3.2 oz)     Intake/Output Summary (Last 24 hours) at 06/13/12 1140 Last data filed at 06/13/12 0900  Gross per 24 hour  Intake   2225 ml  Output   2450 ml  Net   -225 ml    Exam Awake , pleasently confused  oriented x 1, No new F.N deficits, Normal affect [place].AT,PERRAL Supple Neck,No JVD, No cervical lymphadenopathy appriciated.  Symmetrical Chest wall movement, Good air movement bilaterally, CTAB RRR,No Gallops,Rubs or new Murmurs, No Parasternal Heave +ve B.Sounds, Abd Soft, Non tender, No organomegaly appriciated, No rebound - guarding or rigidity. No Cyanosis, Clubbing or edema, No new Rash or bruise     Data Review   Micro Results Recent Results (from the past 240 hour(s))  URINE CULTURE     Status: None   Collection Time    06/08/12 10:49 PM      Result Value Range Status   Specimen Description URINE, CLEAN CATCH   Final   Special Requests NONE   Final   Culture  Setup Time 06/09/2012 08:47   Final   Colony Count 15,000 COLONIES/ML   Final   Culture     Final   Value: Multiple bacterial morphotypes present, none predominant. Suggest appropriate recollection if clinically indicated.   Report Status 06/10/2012 FINAL   Final  URINE CULTURE     Status: None   Collection Time    06/11/12  5:02 PM      Result Value Range Status   Specimen Description URINE, CATHETERIZED   Final   Special Requests NONE   Final   Culture  Setup Time 06/11/2012 21:33   Final   Colony Count NO GROWTH   Final   Culture NO GROWTH   Final   Report Status 06/12/2012 FINAL   Final  MRSA PCR SCREENING     Status: None   Collection Time    06/12/12 12:05 AM      Result Value Range Status   MRSA by PCR NEGATIVE  NEGATIVE Final   Comment:            The GeneXpert MRSA Assay (FDA     approved for NASAL specimens     only), is one component of a     comprehensive MRSA colonization     surveillance program. It is not     intended to diagnose MRSA  infection nor to guide or     monitor treatment for     MRSA infections.    Radiology Reports Dg Chest 2 View  06/11/2012  *RADIOLOGY REPORT*  Clinical Data: Altered mental status, weakness and urinary tract infection.  CHEST - 2 VIEW  Comparison:  08/16/2011 and prior chest radiographs  Findings: The cardiomediastinal silhouette is stable with mild cardiomegaly. Mild interstitial prominence is unchanged. There is no evidence of focal airspace disease, pulmonary edema, suspicious pulmonary nodule/mass, pleural effusion, or pneumothorax. No acute bony abnormalities are identified. A T12 compression fracture is remote.  IMPRESSION: No evidence of acute cardiopulmonary disease.   Original Report Authenticated By: Harmon Pier, M.D.    Dg Elbow Complete Right  06/08/2012  *RADIOLOGY REPORT*  Clinical Data: Fall with right elbow pain and injury.  RIGHT ELBOW - COMPLETE 3+ VIEW  Comparison:  None.  Findings:  There is no evidence of fracture, dislocation, or joint effusion.  There is no evidence of arthropathy or other focal bone abnormality.  Bones are osteopenic.  Soft tissues are unremarkable. No joint effusion is identified.  IMPRESSION: No acute fracture.   Original Report Authenticated By: Irish Lack, M.D.    Ct Head Wo Contrast  06/11/2012  *RADIOLOGY REPORT*  Clinical Data: Weakness and confusion.  CT HEAD WITHOUT CONTRAST  Technique:  Contiguous axial images were obtained from the base of the skull through the vertex without contrast.  Comparison: Head CT 06/08/2012.  Findings: Cerebral and cerebellar atrophy is again noted.  Patchy and confluent areas of decreased attenuation throughout the deep and periventricular white matter of the cerebral hemispheres bilaterally is compatible with chronic microvascular ischemic disease, and is similar to the prior examination.  Right temporoparietal scalp swelling noted on the prior study has largely resolved. No acute displaced skull fractures are identified.  No acute intracranial abnormality.  Specifically, no evidence of acute post-traumatic intracranial hemorrhage, no definite regions of acute/subacute cerebral ischemia, no focal mass, mass effect, hydrocephalus or abnormal intra or extra-axial fluid  collections. The visualized paranasal sinuses and mastoids are well pneumatized.  IMPRESSION: 1.  No acute intracranial abnormalities. 2.  Cerebral and cerebellar atrophy with extensive chronic microvascular ischemic changes in the cerebral white matter redemonstrated, as above.   Original Report Authenticated By: Trudie Reed, M.D.    Ct Head Wo Contrast  06/08/2012  *RADIOLOGY REPORT*  Clinical Data: Unwitnessed fall, hitting head.  History of dementia.  No loss of consciousness.  CT HEAD WITHOUT CONTRAST  Technique:  Contiguous axial images were obtained from the base of the skull through the vertex without contrast.  Comparison: 08/14/2011.  Findings: There is no evidence for acute infarction, intracranial hemorrhage, mass lesion, hydrocephalus, or extra-axial fluid. Moderate atrophy.  Extensive chronic microvascular ischemic change. Bilateral cataract extraction.  Moderate sized right temporoparietal scalp hematoma with subcutaneous air suggesting laceration.  There is no visible underlying skull fracture.  There is no adjacent subdural hematoma. No contrecoup injury is identified.  There is advanced vascular calcification.  The sinuses and mastoids are clear.  Compared with priors, a similar appearance is noted.  IMPRESSION: Right temporoparietal scalp laceration and hematoma.  No skull fracture or intracranial hemorrhage.  Atrophy and chronic microvascular ischemic change appears stable.   Original Report Authenticated By: Davonna Belling, M.D.     CBC  Recent Labs Lab 06/08/12 2005 06/11/12 1700 06/12/12 0455  WBC 6.2 4.9 3.9*  HGB 11.1* 11.5* 10.8*  HCT 34.1* 35.0* 33.2*  PLT 297 328 296  MCV 92.7 92.3 92.0  MCH 30.2 30.3 29.9  MCHC 32.6 32.9 32.5  RDW 16.2* 16.5* 16.4*  LYMPHSABS 1.2 1.1  --   MONOABS 0.6 0.7  --   EOSABS 0.2 0.3  --   BASOSABS 0.1 0.0  --     Chemistries   Recent Labs Lab 06/08/12 2005 06/11/12 1700 06/12/12 0455 06/13/12 0525  NA 138 139 139 141  K 3.7  4.0 4.0 3.9  CL 101 102 106 105  CO2 27 29 27 28   GLUCOSE 148* 99 89 104*  BUN 19 21 18 12   CREATININE 0.86 1.27* 1.08 0.91  CALCIUM 9.1 9.0 8.7 8.9  MG  --   --   --  2.3  AST 14  --  14  --   ALT 10  --  7  --   ALKPHOS 90  --  88  --   BILITOT 0.2*  --  0.2*  --    ------------------------------------------------------------------------------------------------------------------ estimated creatinine clearance is 34 ml/min (by C-G formula based on Cr of 0.91). ------------------------------------------------------------------------------------------------------------------ No results found for this basename: HGBA1C,  in the last 72 hours ------------------------------------------------------------------------------------------------------------------ No results found for this basename: CHOL, HDL, LDLCALC, TRIG, CHOLHDL, LDLDIRECT,  in the last 72 hours ------------------------------------------------------------------------------------------------------------------ No results found for this basename: TSH, T4TOTAL, FREET3, T3FREE, THYROIDAB,  in the last 72 hours ------------------------------------------------------------------------------------------------------------------ No results found for this basename: VITAMINB12, FOLATE, FERRITIN, TIBC, IRON, RETICCTPCT,  in the last 72 hours  Coagulation profile  Recent Labs Lab 06/08/12 2005  INR 1.05    No results found for this basename: DDIMER,  in the last 72 hours  Cardiac Enzymes  Recent Labs Lab 06/08/12 2005  TROPONINI <0.30   ------------------------------------------------------------------------------------------------------------------ No components found with this basename: POCBNP,

## 2012-06-13 NOTE — Progress Notes (Signed)
CSW spoke with patient & son at bedside re: discharge planning. Patient to return to [facility] ALF when stable. Anticipating discharge tomorrow.   [name], LCSW [facility] Clinical Social Worker cell #: [phone]

## 2012-06-13 NOTE — Progress Notes (Signed)
Physical Therapy Treatment Patient Details Name: Angelica Marshall MRN: [id number] DOB: [birth year] Today's Date: 06/13/2012 Time: 1005-1030 PT Time Calculation (min): 25 min  PT Assessment / Plan / Recommendation Comments on Treatment Session  Pt doing well with ambulation, she walked 400' with RW and min A to maneuver RW.     Follow Up Recommendations  Home health PT;Supervision/Assistance - 24 hour (HHPT if assist available at ALF otherwise SNF)     Does the patient have the potential to tolerate intense rehabilitation     Barriers to Discharge        Equipment Recommendations  None recommended by PT    Recommendations for Other Services    Frequency Min 3X/week   Plan Discharge plan remains appropriate;Frequency remains appropriate    Precautions / Restrictions Precautions Precautions: Fall Precaution Comments: h/o falls per son Restrictions Weight Bearing Restrictions: No   Pertinent Vitals/Pain **pt denied pain*    Mobility  Bed Mobility Bed Mobility: Supine to Sit Supine to Sit: 5: Supervision Details for Bed Mobility Assistance: verbal cues for technique due to confusion and poor vision Transfers Transfers: Sit to Stand;Stand to Sit Sit to Stand: 4: Min assist;With upper extremity assist;From bed Stand to Sit: 4: Min assist;With upper extremity assist;To bed;To chair/3-in-1 Details for Transfer Assistance: verbal cues for safe technique, assist for weakness and posterior lean upon standing Ambulation/Gait Ambulation/Gait Assistance: 4: Min assist Ambulation Distance (Feet): 400 Feet Assistive device: Rolling walker Ambulation/Gait Assistance Details: min A to maneuver RW 2* poor vision (blind R eye, 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) Gait Pattern: Decreased stride length;Step-through pattern Gait velocity: WFL General Gait Details: no LOB    Exercises     PT Diagnosis:    PT Problem List:   PT Treatment Interventions:     PT Goals Acute Rehab PT  Goals PT Goal Formulation: With family Time For Goal Achievement: 06/19/12 Potential to Achieve Goals: Good Pt will go Sit to Stand: with supervision PT Goal: Sit to Stand - Progress: Progressing toward goal Pt will go Stand to Sit: with supervision PT Goal: Stand to Sit - Progress: Progressing toward goal Pt will Ambulate: 51 - 150 feet;with supervision;with rolling walker PT Goal: Ambulate - Progress: Progressing toward goal Pt will Perform Home Exercise Program: with supervision, verbal cues required/provided  Visit Information  Last PT Received On: 06/13/12 Assistance Needed: +1    Subjective Data  Subjective[place]when asked where she is) Patient Stated Goal: none stated by pt, son wants pt to be mobille enough to return to ALF   Cognition  Cognition Arousal/Alertness: Awake/alert Behavior During Therapy: Restless Overall Cognitive Status: Impaired/Different from baseline Area of Impairment: Orientation;Memory Orientation Level: Disoriented to;Place;Time;Situation General Comments: per son pt gets confused when she has UTIs and he states this presentation is same, also Information systems manager Balance Assessed: Yes Static Sitting Balance Static Sitting - Balance Support: No upper extremity supported;Feet supported Static Sitting - Level of Assistance: 5: Stand by assistance Static Sitting - Comment/# of Minutes: 3 Static Standing Balance Static Standing - Balance Support: Bilateral upper extremity supported Static Standing - Level of Assistance: 5: Stand by assistance  End of Session PT - End of Session Equipment Utilized During Treatment: Gait belt Activity Tolerance: Patient tolerated treatment well Patient left: with call bell/phone within reach;with family/visitor present;in chair (no chair alarm available, hosptial is out of them) Nurse Communication: Mobility status (saw pt ambulate in hallway, aware  son in room )   GP      Tamala Ser 06/13/2012, 10:37 AM [phone]

## 2012-06-14 MED ORDER — GABAPENTIN 100 MG PO CAPS
200.0000 mg | ORAL_CAPSULE | Freq: Three times a day (TID) | ORAL | Status: DC
  Administered 2012-06-14 – 2012-06-15 (×2): 200 mg via ORAL
  Filled 2012-06-14 (×4): qty 2

## 2012-06-14 NOTE — Progress Notes (Signed)
CSW met with [name] from [facility], if patient is still requiring 24 hour supervision - they would not be able to accept patient back. Patient, son & daughter-in-law at bedside aware. CSW left message for [name] @ [facility] inquiring about bed availability as patient has been there multiple times in the past. RN, [name] made Dr. Waymon Amato aware. Anticipating discharge tomorrow to [facility] pending bed availability.   Clinical Social Work Department CLINICAL SOCIAL WORK PLACEMENT NOTE 06/14/2012  Patient:  Angelica Marshall, Angelica Marshall  Account Number:  [id number] Admit date:  06/11/2012  Clinical Social Worker:  Orpah Greek  Date/time:  06/14/2012 04:20 PM  Clinical Social Work is seeking post-discharge placement for this patient at the following level of care:   SKILLED NURSING   (*CSW will update this form in Epic as items are completed)   06/14/2012  Patient/family provided with [facility] Department of Clinical Social Work's list of facilities offering this level of care within the geographic area requested by the patient (or if unable, by the patient's family).  06/14/2012  Patient/family informed of their freedom to choose among providers that offer the needed level of care, that participate in Medicare, Medicaid or managed care program needed by the patient, have an available bed and are willing to accept the patient.  06/14/2012  Patient/family informed of [facility]' ownership interest in [facility], as well as of the fact that they are under no obligation to receive care at this facility.  PASARR submitted to EDS on 06/14/2012 PASARR number received from EDS on 06/14/2012  FL2 transmitted to all facilities in geographic area requested by pt/family on  06/14/2012 FL2 transmitted to all facilities within larger geographic area on   Patient informed that his/her managed care company has contracts with or will negotiate with  certain facilities, including the  following:     Patient/family informed of bed offers received:  06/14/2012 Patient chooses bed at  Physician recommends and patient chooses bed at    Patient to be transferred to  on   Patient to be transferred to facility by   The following physician request were entered in Epic:   Additional Comments:   [name], LCSW [facility] Clinical Social Worker cell #: [phone]

## 2012-06-14 NOTE — Progress Notes (Signed)
Physical Therapy Treatment Patient Details Name: Angelica Marshall MRN: [id number] DOB: 09-Mar-1915 Today's Date: 06/14/2012 Time: 4132-4401 PT Time Calculation (min): 26 min  PT Assessment / Plan / Recommendation Comments on Treatment Session  Pt with occasional LE buckling/spasms which required min assist during ambulation.  Pt also performed exercise in recliner.  Pt will require at least min assist upon d/c for mobility so may need ST-SNF if ALF is unable to provide this.    Follow Up Recommendations  Home health PT;Supervision/Assistance - 24 hour     Does the patient have the potential to tolerate intense rehabilitation     Barriers to Discharge        Equipment Recommendations  None recommended by PT    Recommendations for Other Services    Frequency     Plan Discharge plan remains appropriate;Frequency remains appropriate    Precautions / Restrictions Precautions Precautions: Fall Precaution Comments: h/o falls per son   Pertinent Vitals/Pain n/a    Mobility  Bed Mobility Bed Mobility: Supine to Sit Supine to Sit: 5: Supervision Details for Bed Mobility Assistance: verbal cues for technique due to poor vision Transfers Transfers: Sit to Stand;Stand to Sit Sit to Stand: 4: Min assist;With upper extremity assist;From bed Stand to Sit: 4: Min assist;With upper extremity assist;To chair/3-in-1 Details for Transfer Assistance: verbal cues for safe technique, assist for weakness and posterior lean upon standing Ambulation/Gait Ambulation/Gait Assistance: 4: Min assist Ambulation Distance (Feet): 360 Feet Assistive device: Rolling walker Ambulation/Gait Assistance Details: assist to steady as pt had occasional bouts of legs buckling/spasms/shakiness lasting 30 sec or less, gait pattern progressed from short shuffling steps to better stride length (still decreased however) Gait Pattern: Decreased stride length;Step-through pattern Gait velocity: decreased    Exercises  General Exercises - Lower Extremity Ankle Circles/Pumps: AROM;Both;20 reps Long Arc Quad: AROM;Both;10 reps;Seated Heel Slides: 10 reps;Both;AROM Hip ABduction/ADduction: AROM;Both;10 reps Hip Flexion/Marching: AROM;Both;10 reps;Seated   PT Diagnosis:    PT Problem List:   PT Treatment Interventions:     PT Goals Acute Rehab PT Goals PT Goal Formulation: With family Time For Goal Achievement: 06/19/12 Potential to Achieve Goals: Good Pt will go Sit to Stand: with supervision PT Goal: Sit to Stand - Progress: Met Pt will go Stand to Sit: with supervision PT Goal: Stand to Sit - Progress: Progressing toward goal Pt will Ambulate: 51 - 150 feet;with supervision;with rolling walker PT Goal: Ambulate - Progress: Progressing toward goal Pt will Perform Home Exercise Program: with supervision, verbal cues required/provided PT Goal: Perform Home Exercise Program - Progress: Progressing toward goal  Visit Information  Last PT Received On: 06/14/12 Assistance Needed: +1    Subjective Data  Subjective: The lady is gonna be here in 20 min.  ([name] representative)   Cognition  Cognition Arousal/Alertness: Awake/alert Behavior During Therapy: WFL for tasks assessed/performed Overall Cognitive Status: Impaired/Different from baseline Area of Impairment: Memory General Comments: pt did not remember being out of bed with therapy     Balance     End of Session PT - End of Session Equipment Utilized During Treatment: Gait belt Activity Tolerance: Patient tolerated treatment well Patient left: with call bell/phone within reach;with family/visitor present;in chair Nurse Communication: Mobility status   GP     Osie Merkin,KATHrine E 06/14/2012, 3:44 PM Zenovia Jarred, PT, DPT 06/14/2012 Pager: [phone]

## 2012-06-14 NOTE — Progress Notes (Signed)
[facility] PROGRESS NOTE  Angelica Marshall OZH:[id number] DOB: Feb 23, 1915 DOA: 06/11/2012 PCP: MAZZOCCHI, Rise Mu, MD  Brief narrative 79 year old female patient, ALF resident, PMH of HTN, dementia, recurrent falls was admitted on 06/11/12 with confusion. UA was suggestive of UTI. Per family, patient gets confused when she has UTIs. Recently started on Bactrim as OP for UTI but symptoms persisted. In the ED, CT head negative for acute findings, chest x-ray unremarkable. Hospitalist admission requested.   Assessment/Plan: 1. UTI: Initial urine culture was suggestive of contamination (4/24) but subsequent urine culture (4/27) showed no growth. DC all antibiotics. Unsure if Bactrim addressed her UTI or she never had one-UA from 4/24 and 4/27 not impressive. 2. Acute encephalopathy:? UTI versus Bactrim complicating underlying dementia. Improved. 3. Dehydration: Resolved after IV fluids. 4. Hypertension: Controlled. 5. Dementia: Improved mental status. 6. Anemia, possibly chronic: Stable. Follow CBC in a.m. 7. Hypertension: Slightly fluctuating blood pressures.  Code Status: DO NOT RESUSCITATE Family Communication: Discussed with patient's son. Disposition Plan: Awaiting SNF bed. Patient's prior ALF unable to provide 24/7 supervision advised by PT.   Consultants:  None  Procedures:  None  Antibiotics:  IV Rocephin 4/28 >   HPI/Subjective: Patient denies complaints. As per nursing, mental status has improved and no acute events.  Objective: Filed Vitals:   06/13/12 1320 06/13/12 2132 06/14/12 0532 06/14/12 1435  BP: 131/75 180/84 168/93 117/65  Pulse: 78 83 91 90  Temp: 97.9 F (36.6 C) 97.5 F (36.4 C) 98.1 F (36.7 C) 97.8 F (36.6 C)  TempSrc: Oral Oral Oral Oral  Resp: 18 18 18 16   Height:      Weight:   60.8 kg (134 lb 0.6 oz)   SpO2: 99% 98% 97% 100%    Intake/Output Summary (Last 24 hours) at 06/14/12 1751 Last data filed at 06/14/12 1514  Gross per 24  hour  Intake    530 ml  Output    601 ml  Net    -71 ml   Filed Weights   06/12/12 0500 06/13/12 0500 06/14/12 0532  Weight: 60.7 kg (133 lb 13.1 oz) 59.7 kg (131 lb 9.8 oz) 60.8 kg (134 lb 0.6 oz)    Exam:   General exam: Comfortable.  Respiratory system: Clear. No increased work of breathing.  Cardiovascular system: S1 & S2 heard, RRR. No JVD, murmurs, gallops, clicks or pedal edema. Telemetry shows sinus rhythm.  Gastrointestinal system: Abdomen is nondistended, soft and nontender. Normal bowel sounds heard.  Central nervous system: Alert and oriented only to self. No focal neurological deficits.  Extremities: Symmetric 5 x 5 power.   Data Reviewed: Basic Metabolic Panel:  Recent Labs Lab 06/08/12 2005 06/11/12 1700 06/12/12 0455 06/13/12 0525  NA 138 139 139 141  K 3.7 4.0 4.0 3.9  CL 101 102 106 105  CO2 27 29 27 28   GLUCOSE 148* 99 89 104*  BUN 19 21 18 12   CREATININE 0.86 1.27* 1.08 0.91  CALCIUM 9.1 9.0 8.7 8.9  MG  --   --   --  2.3   Liver Function Tests:  Recent Labs Lab 06/08/12 2005 06/12/12 0455  AST 14 14  ALT 10 7  ALKPHOS 90 88  BILITOT 0.2* 0.2*  PROT 6.2 5.8*  ALBUMIN 2.9* 2.7*   No results found for this basename: LIPASE, AMYLASE,  in the last 168 hours  Recent Labs Lab 06/11/12 1700 06/12/12 0647  AMMONIA 20 22   CBC:  Recent Labs Lab 06/08/12 2005 06/11/12 1700 06/12/12  0455  WBC 6.2 4.9 3.9*  NEUTROABS 4.2 2.9  --   HGB 11.1* 11.5* 10.8*  HCT 34.1* 35.0* 33.2*  MCV 92.7 92.3 92.0  PLT 297 328 296   Cardiac Enzymes:  Recent Labs Lab 06/08/12 2005  TROPONINI <0.30   BNP (last 3 results) No results found for this basename: PROBNP,  in the last 8760 hours CBG: No results found for this basename: GLUCAP,  in the last 168 hours  Recent Results (from the past 240 hour(s))  URINE CULTURE     Status: None   Collection Time    06/08/12 10:49 PM      Result Value Range Status   Specimen Description URINE,  CLEAN CATCH   Final   Special Requests NONE   Final   Culture  Setup Time 06/09/2012 08:47   Final   Colony Count 15,000 COLONIES/ML   Final   Culture     Final   Value: Multiple bacterial morphotypes present, none predominant. Suggest appropriate recollection if clinically indicated.   Report Status 06/10/2012 FINAL   Final  URINE CULTURE     Status: None   Collection Time    06/11/12  5:02 PM      Result Value Range Status   Specimen Description URINE, CATHETERIZED   Final   Special Requests NONE   Final   Culture  Setup Time 06/11/2012 21:33   Final   Colony Count NO GROWTH   Final   Culture NO GROWTH   Final   Report Status 06/12/2012 FINAL   Final  MRSA PCR SCREENING     Status: None   Collection Time    06/12/12 12:05 AM      Result Value Range Status   MRSA by PCR NEGATIVE  NEGATIVE Final   Comment:            The GeneXpert MRSA Assay (FDA     approved for NASAL specimens     only), is one component of a     comprehensive MRSA colonization     surveillance program. It is not     intended to diagnose MRSA     infection nor to guide or     monitor treatment for     MRSA infections.     Studies: No results found.   Additional labs:   Scheduled Meds: . acetaminophen  500 mg Oral TID  . ALPRAZolam  0.5 mg Oral QHS  . atorvastatin  20 mg Oral Daily  . calcium-vitamin D  1 tablet Oral BID  . cefTRIAXone (ROCEPHIN)  IV  1 g Intravenous Q24H  . feeding supplement  237 mL Oral BID  . gabapentin  300 mg Oral TID  . heparin  5,000 Units Subcutaneous Q8H  . metoprolol tartrate  12.5 mg Oral BID  . pantoprazole  80 mg Oral Q1200  . senna  1 tablet Oral BID  . senna-docusate  1 tablet Oral QHS  . sertraline  50 mg Oral Daily  . sodium chloride  3 mL Intravenous Q12H  . traMADol  50 mg Oral TID   Continuous Infusions:   Principal Problem:   UTI (urinary tract infection) Active Problems:   HTN (hypertension)   Dehydration   Weakness generalized    Encephalopathy, metabolic   Alzheimer's disease    Time spent: 35 minutes    [facility]  [facility] Pager [phone].   If 8PM-8AM, please contact night-coverage at [email or web address], password [facility] 06/14/2012, 5:51 PM  LOS:  3 days

## 2012-06-15 LAB — CBC
HCT: 32.8 % — ABNORMAL LOW (ref 36.0–46.0)
Platelets: 341 10*3/uL (ref 150–400)
RDW: 16.4 % — ABNORMAL HIGH (ref 11.5–15.5)
WBC: 4.9 10*3/uL (ref 4.0–10.5)

## 2012-06-15 MED ORDER — GABAPENTIN 100 MG PO CAPS: 200.0000 mg | ORAL_CAPSULE | Freq: Three times a day (TID) | ORAL | Status: DC

## 2012-06-15 MED ORDER — ALPRAZOLAM 0.5 MG PO TABS: 0.5000 mg | ORAL_TABLET | Freq: Every day | ORAL | Status: DC

## 2012-06-15 MED ORDER — ALPRAZOLAM 0.25 MG PO TABS: 0.2500 mg | ORAL_TABLET | Freq: Two times a day (BID) | ORAL | Status: DC | PRN

## 2012-06-15 NOTE — Discharge Summary (Addendum)
Physician Discharge Summary  Angelica Marshall WUJ:[id number] DOB: 24-Jan-1916 DOA: 06/11/2012  PCP: Angelica Coventry, MD  Admit date: 06/11/2012 Discharge date: 06/15/2012  Time spent: Greater than 30 minutes  Recommendations for Outpatient Follow-up:  1. Dr. Talmadge Marshall, PCP in 5 days. 2. Home health PT and 24 hour supervision 3. Consider lab work-TSH, B12 and RPR during M.D. visit.  Discharge Diagnoses:  Principal Problem:   UTI (urinary tract infection) Active Problems:   HTN (hypertension)   Dehydration   Weakness generalized   Encephalopathy, metabolic   Alzheimer's disease   Discharge Condition: Improved & Stable  Diet recommendation: Heart healthy  Filed Weights   06/13/12 0500 06/14/12 0532 06/15/12 0527  Weight: 59.7 kg (131 lb 9.8 oz) 60.8 kg (134 lb 0.6 oz) 60 kg (132 lb 4.4 oz)    History of present illness:  79 year old female patient, ALF resident, PMH of HTN, dementia, recurrent falls was admitted on 06/11/12 with confusion. UA was suggestive of UTI. Per family, patient gets confused when she has UTIs. Recently started on Bactrim as OP for UTI but symptoms persisted. In the ED, CT head negative for acute findings, chest x-ray unremarkable. Hospitalist admission requested.  Hospital Course:  1. Acute encephalopathy: Unclear etiology. Per discussion with patient's son Mr. Angelica Marshall today, patient has memory impairment from her dementia but otherwise is coherent. Whenever she gets a UTI she gets confused and hallucinates at times. She was seen in the ED on 4/24 after a fall and hit to the head without loss of consciousness. CT head 4/24 showed a right temporoparietal scalp laceration and hematoma and no other acute findings. UA was not convincing for UTI. She was discharged back to the assisted living facility without antibiotics. She seemed to be okay for a day and then started having worsening confusion, wandering off and hallucinating. Not entirely sure if  her symptoms got worse after starting Bactrim at facility for presumed urinary tract infection. UA on this admission was not impressive but patient was started on IV Rocephin pending cultures. Urine cultures from 4/27 negative and Rocephin was discontinued on 4/30 after 2 doses. CT head repeated on 4/27 did not show any acute abnormalities. As per son, patient also did not receive her Xanax 2 days back which led to reduced sleep. According to him, her mental status is improving but still not at baseline. No focal deficits on clinical exam. So the etiology of her change in mental status is not fully clear-? Related to head injury/concussion, presumed UTI although UA and urine cultures were not convincing, medications-Bactrim, dehydration and mild AKI, stroke not ruled out-CT head negative versus progressive dementia. May consider MRI brain (however even if a stroke is confirmed, it would not change management drastically), metabolic workup-B12, TSH and RPR (less likely given abrupt onset of symptoms) as outpatient. As per pharmacy recommendations, patient's creatinine clearance 34 mL per minute and manufacturer's recommendation for gabapentin is 200-700 mg daily to avoid CNS side effects. Thereby Neurontin was reduced to 200 mg 3 times a day. 2. Dehydration and mild AKI: HCTZ is currently held. Resolved after IV fluid hydration. 3. Hypertension: Controlled. 4. Anemia, possibly chronic: Stable. 5. Dementia: Discussion for problem #1. 6. DO NOT RESUSCITATE 7. Generalized Weakness: probably due to acute illness- fall, confusion, lack of sleep, ? Poor Po intake complicating already frail elderly patient with Dementia. Son came to hospital after our discussion this am and thinks that she is weaker and that something might have changed. Advised him that  we did not see any additional evaluation in the hospital that was necessary and that she could proceed with rehab at [facility]. Offered him 2nd opinion from one of my  partners to evaluate, which he declined and indicated that he would let her go to a NH.  Procedures:  None   Consultations:  None  Discharge Exam:  Complaints: Patient still pleasantly confused but not agitated. No hallucinations currently. Per nursing, no acute events.  Filed Vitals:   06/14/12 0532 06/14/12 1435 06/14/12 2125 06/15/12 0527  BP: 168/93 117/65 129/82 158/89  Pulse: 91 90 88 86  Temp: 98.1 F (36.7 C) 97.8 F (36.6 C) 98.1 F (36.7 C) 97.9 F (36.6 C)  TempSrc: Oral Oral Oral Oral  Resp: 18 16 18 18   Height:      Weight: 60.8 kg (134 lb 0.6 oz)   60 kg (132 lb 4.4 oz)  SpO2: 97% 100% 97% 95%    General exam: Comfortable.  Respiratory system: Clear. No increased work of breathing.  Cardiovascular system: S1 & S2 heard, RRR. No JVD, murmurs, gallops, clicks or pedal edema.  Gastrointestinal system: Abdomen is nondistended, soft and nontender. Normal bowel sounds heard.  Central nervous system: Alert and oriented only to self and place. No focal neurological deficits.  Extremities: Symmetric 5 x 5 power.  Discharge Instructions      Discharge Orders   Future Orders Complete By Expires     Call MD for:  temperature >100.4  As directed     Call MD for:  As directed     Comments:      Worsening confusion.    Diet - low sodium heart healthy  As directed     Increase activity slowly  As directed         Medication List    STOP taking these medications       hydrochlorothiazide 12.5 MG capsule  Commonly known as:  MICROZIDE     sulfamethoxazole-trimethoprim 800-160 MG per tablet  Commonly known as:  BACTRIM DS      TAKE these medications       acetaminophen 500 MG tablet  Commonly known as:  TYLENOL  Take 500 mg by mouth 3 (three) times daily.     ALPRAZolam 0.25 MG tablet  Commonly known as:  XANAX  Take 1 tablet (0.25 mg total) by mouth 2 (two) times daily as needed for sleep or anxiety.     ALPRAZolam 0.5 MG tablet  Commonly known  as:  XANAX  Take 1 tablet (0.5 mg total) by mouth at bedtime.     atorvastatin 20 MG tablet  Commonly known as:  LIPITOR  Take 20 mg by mouth daily.     calcium-vitamin D 500-200 MG-UNIT per tablet  Commonly known as:  OSCAL WITH D  Take 1 tablet by mouth 2 (two) times daily.     CRANBERRY EXTRACT PO  Take 475 mg by mouth 2 (two) times daily.     Ensure Plus Liqd  Take 237 mLs by mouth 2 (two) times daily.     esomeprazole 40 MG capsule  Commonly known as:  NEXIUM  Take 40 mg by mouth daily before breakfast.     gabapentin 100 MG capsule  Commonly known as:  NEURONTIN  Take 2 capsules (200 mg total) by mouth 3 (three) times daily.     loperamide 2 MG capsule  Commonly known as:  IMODIUM  Take 2 mg by mouth 4 (four) times daily  as needed for diarrhea or loose stools.     loratadine 10 MG tablet  Commonly known as:  CLARITIN  Take 10 mg by mouth daily.     Melatonin 3 MG Tabs  Take 1 tablet by mouth at bedtime.     metoprolol tartrate 25 MG tablet  Commonly known as:  LOPRESSOR  Take 12.5 mg by mouth 2 (two) times daily.     ondansetron 4 MG tablet  Commonly known as:  ZOFRAN  Take 4 mg by mouth every 6 (six) hours as needed for nausea.     senna 8.6 MG Tabs  Commonly known as:  SENOKOT  Take 1 tablet by mouth 2 (two) times daily.     senna-docusate 8.6-50 MG per tablet  Commonly known as:  Senokot-S  Take 1 tablet by mouth at bedtime.     sertraline 50 MG tablet  Commonly known as:  ZOLOFT  Take 50 mg by mouth daily.     simethicone 80 MG chewable tablet  Commonly known as:  MYLICON  Chew 80 mg by mouth every 6 (six) hours as needed for flatulence.     traMADol 50 MG tablet  Commonly known as:  ULTRAM  Take 50 mg by mouth 3 (three) times daily.       Follow-up Information   Follow up with MAZZOCCHI, Rise Mu, MD. Schedule an appointment as soon as possible for a visit in 5 days.   Contact information:   [address] [place]  [address] [phone]        The results of significant diagnostics from this hospitalization (including imaging, microbiology, ancillary and laboratory) are listed below for reference.    Significant Diagnostic Studies: Dg Chest 2 View  06/11/2012  *RADIOLOGY REPORT*  Clinical Data: Altered mental status, weakness and urinary tract infection.  CHEST - 2 VIEW  Comparison: 08/16/2011 and prior chest radiographs  Findings: The cardiomediastinal silhouette is stable with mild cardiomegaly. Mild interstitial prominence is unchanged. There is no evidence of focal airspace disease, pulmonary edema, suspicious pulmonary nodule/mass, pleural effusion, or pneumothorax. No acute bony abnormalities are identified. A T12 compression fracture is remote.  IMPRESSION: No evidence of acute cardiopulmonary disease.   Original Report Authenticated By: Harmon Pier, M.D.    Dg Elbow Complete Right  06/08/2012  *RADIOLOGY REPORT*  Clinical Data: Fall with right elbow pain and injury.  RIGHT ELBOW - COMPLETE 3+ VIEW  Comparison:  None.  Findings:  There is no evidence of fracture, dislocation, or joint effusion.  There is no evidence of arthropathy or other focal bone abnormality.  Bones are osteopenic.  Soft tissues are unremarkable. No joint effusion is identified.  IMPRESSION: No acute fracture.   Original Report Authenticated By: Irish Lack, M.D.    Ct Head Wo Contrast  06/11/2012  *RADIOLOGY REPORT*  Clinical Data: Weakness and confusion.  CT HEAD WITHOUT CONTRAST  Technique:  Contiguous axial images were obtained from the base of the skull through the vertex without contrast.  Comparison: Head CT 06/08/2012.  Findings: Cerebral and cerebellar atrophy is again noted.  Patchy and confluent areas of decreased attenuation throughout the deep and periventricular white matter of the cerebral hemispheres bilaterally is compatible with chronic microvascular ischemic disease, and is similar to the prior examination.  Right  temporoparietal scalp swelling noted on the prior study has largely resolved. No acute displaced skull fractures are identified.  No acute intracranial abnormality.  Specifically, no evidence of acute post-traumatic intracranial hemorrhage, no definite regions  of acute/subacute cerebral ischemia, no focal mass, mass effect, hydrocephalus or abnormal intra or extra-axial fluid collections. The visualized paranasal sinuses and mastoids are well pneumatized.  IMPRESSION: 1.  No acute intracranial abnormalities. 2.  Cerebral and cerebellar atrophy with extensive chronic microvascular ischemic changes in the cerebral white matter redemonstrated, as above.   Original Report Authenticated By: Trudie Reed, M.D.    Ct Head Wo Contrast  06/08/2012  *RADIOLOGY REPORT*  Clinical Data: Unwitnessed fall, hitting head.  History of dementia.  No loss of consciousness.  CT HEAD WITHOUT CONTRAST  Technique:  Contiguous axial images were obtained from the base of the skull through the vertex without contrast.  Comparison: 08/14/2011.  Findings: There is no evidence for acute infarction, intracranial hemorrhage, mass lesion, hydrocephalus, or extra-axial fluid. Moderate atrophy.  Extensive chronic microvascular ischemic change. Bilateral cataract extraction.  Moderate sized right temporoparietal scalp hematoma with subcutaneous air suggesting laceration.  There is no visible underlying skull fracture.  There is no adjacent subdural hematoma. No contrecoup injury is identified.  There is advanced vascular calcification.  The sinuses and mastoids are clear.  Compared with priors, a similar appearance is noted.  IMPRESSION: Right temporoparietal scalp laceration and hematoma.  No skull fracture or intracranial hemorrhage.  Atrophy and chronic microvascular ischemic change appears stable.   Original Report Authenticated By: Davonna Belling, M.D.     Microbiology: Recent Results (from the past 240 hour(s))  URINE CULTURE     Status:  None   Collection Time    06/08/12 10:49 PM      Result Value Range Status   Specimen Description URINE, CLEAN CATCH   Final   Special Requests NONE   Final   Culture  Setup Time 06/09/2012 08:47   Final   Colony Count 15,000 COLONIES/ML   Final   Culture     Final   Value: Multiple bacterial morphotypes present, none predominant. Suggest appropriate recollection if clinically indicated.   Report Status 06/10/2012 FINAL   Final  URINE CULTURE     Status: None   Collection Time    06/11/12  5:02 PM      Result Value Range Status   Specimen Description URINE, CATHETERIZED   Final   Special Requests NONE   Final   Culture  Setup Time 06/11/2012 21:33   Final   Colony Count NO GROWTH   Final   Culture NO GROWTH   Final   Report Status 06/12/2012 FINAL   Final  MRSA PCR SCREENING     Status: None   Collection Time    06/12/12 12:05 AM      Result Value Range Status   MRSA by PCR NEGATIVE  NEGATIVE Final   Comment:            The GeneXpert MRSA Assay (FDA     approved for NASAL specimens     only), is one component of a     comprehensive MRSA colonization     surveillance program. It is not     intended to diagnose MRSA     infection nor to guide or     monitor treatment for     MRSA infections.     Labs: Basic Metabolic Panel:  Recent Labs Lab 06/08/12 2005 06/11/12 1700 06/12/12 0455 06/13/12 0525  NA 138 139 139 141  K 3.7 4.0 4.0 3.9  CL 101 102 106 105  CO2 27 29 27 28   GLUCOSE 148* 99 89 104*  BUN  19 21 18 12   CREATININE 0.86 1.27* 1.08 0.91  CALCIUM 9.1 9.0 8.7 8.9  MG  --   --   --  2.3   Liver Function Tests:  Recent Labs Lab 06/08/12 2005 06/12/12 0455  AST 14 14  ALT 10 7  ALKPHOS 90 88  BILITOT 0.2* 0.2*  PROT 6.2 5.8*  ALBUMIN 2.9* 2.7*   No results found for this basename: LIPASE, AMYLASE,  in the last 168 hours  Recent Labs Lab 06/11/12 1700 06/12/12 0647  AMMONIA 20 22   CBC:  Recent Labs Lab 06/08/12 2005 06/11/12 1700  06/12/12 0455 06/15/12 0500  WBC 6.2 4.9 3.9* 4.9  NEUTROABS 4.2 2.9  --   --   HGB 11.1* 11.5* 10.8* 10.6*  HCT 34.1* 35.0* 33.2* 32.8*  MCV 92.7 92.3 92.0 92.9  PLT 297 328 296 341   Cardiac Enzymes:  Recent Labs Lab 06/08/12 2005  TROPONINI <0.30   BNP: BNP (last 3 results) No results found for this basename: PROBNP,  in the last 8760 hours CBG: No results found for this basename: GLUCAP,  in the last 168 hours  Additional labs:    Signed:  HONGALGI,ANAND  [facility] 06/15/2012, 12:22 PM

## 2012-06-15 NOTE — Progress Notes (Signed)
Patient is set to discharge to [facility] today. Patient & son at bedside aware and agreeable. After PT worked with patient and son witnessed patient walk up and down the hall, son is agreeable with her returning to [facility] with 24 hour caregiver. Son states that he will arrange for 24 hour caregiver. Discharge packet provided to son (which included FL2, Discharge Summary, AVS, DNR & prescriptions). Son to transport to facility.   Clinical Social Work Department CLINICAL SOCIAL WORK PLACEMENT NOTE 06/15/2012  Patient:  Angelica Marshall, Angelica Marshall  Account Number:  [id number] Admit date:  06/11/2012  Clinical Social Worker:  Orpah Greek  Date/time:  06/14/2012 04:20 PM  Clinical Social Work is seeking post-discharge placement for this patient at the following level of care:   SKILLED NURSING   (*CSW will update this form in Epic as items are completed)   06/14/2012  Patient/family provided with [facility] Department of Clinical Social Work's list of facilities offering this level of care within the geographic area requested by the patient (or if unable, by the patient's family).  06/14/2012  Patient/family informed of their freedom to choose among providers that offer the needed level of care, that participate in Medicare, Medicaid or managed care program needed by the patient, have an available bed and are willing to accept the patient.  06/14/2012  Patient/family informed of [facility]' ownership interest in [facility], as well as of the fact that they are under no obligation to receive care at this facility.  PASARR submitted to EDS on 06/14/2012 PASARR number received from EDS on 06/14/2012  FL2 transmitted to all facilities in geographic area requested by pt/family on  06/14/2012 FL2 transmitted to all facilities within larger geographic area on   Patient informed that his/her managed care company has contracts with or will negotiate with  certain facilities,  including the following:     Patient/family informed of bed offers received:  06/14/2012 Patient chooses bed at  Physician recommends and patient chooses bed at    Patient to be transferred to  on   Patient to be transferred to facility by   The following physician request were entered in Epic:   Additional Comments: Patient returning to [facility]F with 24 hour caregiver setup by son.  [name], LCSW [facility] Clinical Social Worker cell #: [phone]

## 2012-06-15 NOTE — Progress Notes (Signed)
Physical Therapy Treatment Patient Details Name: Angelica Marshall MRN: [id number] DOB: [birth year] Today's Date: 06/15/2012 Time: 4098-1191 PT Time Calculation (min): 19 min  PT Assessment / Plan / Recommendation Comments on Treatment Session  Pt ambulated in hallway and presents with better gait today.  Son present and observed pt ambulating.  Son reports he would like pt to d/c back to ALF and will have 24/7 caregiver.    Follow Up Recommendations  Home health PT;Supervision/Assistance - 24 hour     Does the patient have the potential to tolerate intense rehabilitation     Barriers to Discharge        Equipment Recommendations  None recommended by PT    Recommendations for Other Services    Frequency     Plan Discharge plan remains appropriate;Frequency remains appropriate    Precautions / Restrictions Precautions Precautions: Fall Precaution Comments: h/o falls per son   Pertinent Vitals/Pain n/a    Mobility  Bed Mobility Details for Bed Mobility Assistance: pt up in recliner upon arrival Transfers Transfers: Sit to Stand;Stand to Sit Sit to Stand: 4: Min assist;With upper extremity assist;From chair/3-in-1 Stand to Sit: 4: Min assist;With upper extremity assist;To chair/3-in-1 Details for Transfer Assistance: verbal cues for safe technique, assist for weakness and posterior lean upon standing Ambulation/Gait Ambulation/Gait Assistance: 4: Min assist Ambulation Distance (Feet): 500 Feet Assistive device: Rolling walker Ambulation/Gait Assistance Details: verbal cues for taking bigger steps, only one occurrance of LEs shaking/buckling near end of ambulation, assist for steadying x2 Gait Pattern: Decreased stride length;Step-through pattern Gait velocity: decreased General Gait Details: pt also performed head turing and going around 20 feet distance floor tiles (weaving)    Exercises     PT Diagnosis:    PT Problem List:   PT Treatment Interventions:     PT  Goals Acute Rehab PT Goals PT Goal: Stand to Sit - Progress: Progressing toward goal PT Goal: Ambulate - Progress: Progressing toward goal  Visit Information  Last PT Received On: 06/15/12 Assistance Needed: +1    Subjective Data  Subjective: No I don't remember you.  Sorry I know it's nice to be remembered.   Cognition  Cognition Arousal/Alertness: Awake/alert Behavior During Therapy: WFL for tasks assessed/performed Overall Cognitive Status: Impaired/Different from baseline Area of Impairment: Memory General Comments: does not remember having therapy yesterday or therapist    Balance     End of Session PT - End of Session Equipment Utilized During Treatment: Gait belt Activity Tolerance: Patient tolerated treatment well Patient left: with call bell/phone within reach;with family/visitor present;in chair   GP     Angelica Marshall,KATHrine E 06/15/2012, 4:40 PM Zenovia Jarred, PT, DPT 06/15/2012 Pager: [phone]

## 2013-08-16 ENCOUNTER — Encounter (HOSPITAL_COMMUNITY): Payer: Self-pay | Admitting: Emergency Medicine

## 2013-08-16 ENCOUNTER — Inpatient Hospital Stay (HOSPITAL_COMMUNITY): Payer: Medicare Other

## 2013-08-16 ENCOUNTER — Other Ambulatory Visit: Payer: Self-pay

## 2013-08-16 ENCOUNTER — Emergency Department (HOSPITAL_COMMUNITY): Payer: Medicare Other

## 2013-08-16 ENCOUNTER — Inpatient Hospital Stay (HOSPITAL_COMMUNITY)
Admission: EM | Admit: 2013-08-16 | Discharge: 2013-08-23 | DRG: 871 | Disposition: A | Discharge status: Skilled Nursing Facility | Payer: Medicare Other | Attending: Internal Medicine | Admitting: Internal Medicine

## 2013-08-16 DIAGNOSIS — F329 Major depressive disorder, single episode, unspecified: Secondary | ICD-10-CM | POA: Diagnosis present

## 2013-08-16 DIAGNOSIS — Z79899 Other long term (current) drug therapy: Secondary | ICD-10-CM

## 2013-08-16 DIAGNOSIS — K219 Gastro-esophageal reflux disease without esophagitis: Secondary | ICD-10-CM | POA: Diagnosis present

## 2013-08-16 DIAGNOSIS — Z833 Family history of diabetes mellitus: Secondary | ICD-10-CM

## 2013-08-16 DIAGNOSIS — E872 Acidosis, unspecified: Secondary | ICD-10-CM | POA: Diagnosis present

## 2013-08-16 DIAGNOSIS — Z9181 History of falling: Secondary | ICD-10-CM

## 2013-08-16 DIAGNOSIS — I10 Essential (primary) hypertension: Secondary | ICD-10-CM

## 2013-08-16 DIAGNOSIS — R7309 Other abnormal glucose: Secondary | ICD-10-CM | POA: Diagnosis present

## 2013-08-16 DIAGNOSIS — R1110 Vomiting, unspecified: Secondary | ICD-10-CM

## 2013-08-16 DIAGNOSIS — A0472 Enterocolitis due to Clostridium difficile, not specified as recurrent: Secondary | ICD-10-CM

## 2013-08-16 DIAGNOSIS — Z8489 Family history of other specified conditions: Secondary | ICD-10-CM

## 2013-08-16 DIAGNOSIS — F0280 Dementia in other diseases classified elsewhere without behavioral disturbance: Secondary | ICD-10-CM

## 2013-08-16 DIAGNOSIS — R112 Nausea with vomiting, unspecified: Secondary | ICD-10-CM

## 2013-08-16 DIAGNOSIS — F411 Generalized anxiety disorder: Secondary | ICD-10-CM | POA: Diagnosis present

## 2013-08-16 DIAGNOSIS — R197 Diarrhea, unspecified: Secondary | ICD-10-CM

## 2013-08-16 DIAGNOSIS — R42 Dizziness and giddiness: Secondary | ICD-10-CM | POA: Diagnosis present

## 2013-08-16 DIAGNOSIS — E860 Dehydration: Secondary | ICD-10-CM

## 2013-08-16 DIAGNOSIS — R5383 Other fatigue: Secondary | ICD-10-CM

## 2013-08-16 DIAGNOSIS — F3289 Other specified depressive episodes: Secondary | ICD-10-CM | POA: Diagnosis present

## 2013-08-16 DIAGNOSIS — R5381 Other malaise: Secondary | ICD-10-CM

## 2013-08-16 DIAGNOSIS — G309 Alzheimer's disease, unspecified: Secondary | ICD-10-CM | POA: Diagnosis present

## 2013-08-16 DIAGNOSIS — G9341 Metabolic encephalopathy: Secondary | ICD-10-CM

## 2013-08-16 DIAGNOSIS — R531 Weakness: Secondary | ICD-10-CM

## 2013-08-16 DIAGNOSIS — A088 Other specified intestinal infections: Secondary | ICD-10-CM | POA: Diagnosis present

## 2013-08-16 DIAGNOSIS — K5289 Other specified noninfective gastroenteritis and colitis: Secondary | ICD-10-CM

## 2013-08-16 DIAGNOSIS — E876 Hypokalemia: Secondary | ICD-10-CM

## 2013-08-16 DIAGNOSIS — M19019 Primary osteoarthritis, unspecified shoulder: Secondary | ICD-10-CM | POA: Diagnosis present

## 2013-08-16 DIAGNOSIS — R627 Adult failure to thrive: Secondary | ICD-10-CM | POA: Diagnosis present

## 2013-08-16 DIAGNOSIS — E785 Hyperlipidemia, unspecified: Secondary | ICD-10-CM | POA: Diagnosis present

## 2013-08-16 DIAGNOSIS — R739 Hyperglycemia, unspecified: Secondary | ICD-10-CM | POA: Diagnosis present

## 2013-08-16 DIAGNOSIS — R296 Repeated falls: Secondary | ICD-10-CM

## 2013-08-16 DIAGNOSIS — Z66 Do not resuscitate: Secondary | ICD-10-CM | POA: Diagnosis present

## 2013-08-16 DIAGNOSIS — A419 Sepsis, unspecified organism: Principal | ICD-10-CM

## 2013-08-16 DIAGNOSIS — K529 Noninfective gastroenteritis and colitis, unspecified: Secondary | ICD-10-CM

## 2013-08-16 DIAGNOSIS — G589 Mononeuropathy, unspecified: Secondary | ICD-10-CM | POA: Diagnosis present

## 2013-08-16 DIAGNOSIS — Z87440 Personal history of urinary (tract) infections: Secondary | ICD-10-CM

## 2013-08-16 DIAGNOSIS — Z881 Allergy status to other antibiotic agents status: Secondary | ICD-10-CM

## 2013-08-16 LAB — COMPREHENSIVE METABOLIC PANEL
ALBUMIN: 4.9 g/dL (ref 3.5–5.2)
ALT: 12 U/L (ref 0–35)
AST: 21 U/L (ref 0–37)
Alkaline Phosphatase: 102 U/L (ref 39–117)
Anion gap: 24 — ABNORMAL HIGH (ref 5–15)
BUN: 14 mg/dL (ref 6–23)
CALCIUM: 9.7 mg/dL (ref 8.4–10.5)
CHLORIDE: 93 meq/L — AB (ref 96–112)
CO2: 18 meq/L — AB (ref 19–32)
Creatinine, Ser: 0.7 mg/dL (ref 0.50–1.10)
GFR calc Af Amer: 82 mL/min — ABNORMAL LOW (ref >90)
GFR, EST NON AFRICAN AMERICAN: 71 mL/min — AB (ref >90)
Glucose, Bld: 224 mg/dL — ABNORMAL HIGH (ref 70–99)
Potassium: 3.2 mEq/L — ABNORMAL LOW (ref 3.7–5.3)
SODIUM: 135 meq/L — AB (ref 137–147)
Total Bilirubin: 1.2 mg/dL (ref 0.3–1.2)
Total Protein: 8.4 g/dL — ABNORMAL HIGH (ref 6.0–8.3)

## 2013-08-16 LAB — URINE MICROSCOPIC-ADD ON

## 2013-08-16 LAB — I-STAT TROPONIN, ED: Troponin i, poc: 0 ng/mL (ref 0.00–0.08)

## 2013-08-16 LAB — CBC WITH DIFFERENTIAL/PLATELET
Basophils Absolute: 0 10*3/uL (ref 0.0–0.1)
Basophils Relative: 0 % (ref 0–1)
Eosinophils Absolute: 0 10*3/uL (ref 0.0–0.7)
Eosinophils Relative: 0 % (ref 0–5)
HCT: 43.8 % (ref 36.0–46.0)
Hemoglobin: 15.2 g/dL — ABNORMAL HIGH (ref 12.0–15.0)
LYMPHS ABS: 1.2 10*3/uL (ref 0.7–4.0)
LYMPHS PCT: 6 % — AB (ref 12–46)
MCH: 31.7 pg (ref 26.0–34.0)
MCHC: 34.7 g/dL (ref 30.0–36.0)
MCV: 91.3 fL (ref 78.0–100.0)
Monocytes Absolute: 1.4 10*3/uL — ABNORMAL HIGH (ref 0.1–1.0)
Monocytes Relative: 7 % (ref 3–12)
NEUTROS PCT: 87 % — AB (ref 43–77)
Neutro Abs: 16 10*3/uL — ABNORMAL HIGH (ref 1.7–7.7)
PLATELETS: 261 10*3/uL (ref 150–400)
RBC: 4.8 MIL/uL (ref 3.87–5.11)
RDW: 14.1 % (ref 11.5–15.5)
WBC: 18.6 10*3/uL — AB (ref 4.0–10.5)

## 2013-08-16 LAB — I-STAT CG4 LACTIC ACID, ED: Lactic Acid, Venous: 4.39 mmol/L — ABNORMAL HIGH (ref 0.5–2.2)

## 2013-08-16 LAB — URINALYSIS, ROUTINE W REFLEX MICROSCOPIC
Bilirubin Urine: NEGATIVE
Ketones, ur: >80 mg/dL — AB
LEUKOCYTES UA: NEGATIVE
NITRITE: NEGATIVE
PH: 7.5 (ref 5.0–8.0)
PROTEIN: 100 mg/dL — AB
Specific Gravity, Urine: 1.019 (ref 1.005–1.030)
Urobilinogen, UA: 0.2 mg/dL (ref 0.0–1.0)

## 2013-08-16 LAB — CBG MONITORING, ED: GLUCOSE-CAPILLARY: 173 mg/dL — AB (ref 70–99)

## 2013-08-16 LAB — HEMOGLOBIN A1C
HEMOGLOBIN A1C: 5.8 % — AB (ref <5.7)
MEAN PLASMA GLUCOSE: 120 mg/dL — AB (ref <117)

## 2013-08-16 LAB — GLUCOSE, CAPILLARY: GLUCOSE-CAPILLARY: 215 mg/dL — AB (ref 70–99)

## 2013-08-16 MED ORDER — SODIUM CHLORIDE 0.9 % IV SOLN
INTRAVENOUS | Status: DC
Start: 2013-08-16 — End: 2013-08-16

## 2013-08-16 MED ORDER — ENSURE COMPLETE PO LIQD
237.0000 mL | Freq: Two times a day (BID) | ORAL | Status: DC
  Administered 2013-08-17 – 2013-08-23 (×7): 237 mL via ORAL

## 2013-08-16 MED ORDER — TRAMADOL HCL 50 MG PO TABS: 50.0000 mg | ORAL_TABLET | Freq: Four times a day (QID) | ORAL | Status: DC | PRN

## 2013-08-16 MED ORDER — SODIUM CHLORIDE 0.9 % IV BOLUS (SEPSIS): 500.0000 mL | Freq: Once | INTRAVENOUS | Status: DC

## 2013-08-16 MED ORDER — POTASSIUM CHLORIDE IN NACL 20-0.9 MEQ/L-% IV SOLN
INTRAVENOUS | Status: DC
  Administered 2013-08-16 – 2013-08-18 (×3): via INTRAVENOUS
  Filled 2013-08-16 (×8): qty 1000

## 2013-08-16 MED ORDER — METOPROLOL TARTRATE 12.5 MG HALF TABLET: 12.5000 mg | ORAL_TABLET | Freq: Two times a day (BID) | ORAL | Status: DC

## 2013-08-16 MED ORDER — ENOXAPARIN SODIUM 40 MG/0.4ML ~~LOC~~ SOLN
40.0000 mg | SUBCUTANEOUS | Status: DC
  Administered 2013-08-16 – 2013-08-22 (×7): 40 mg via SUBCUTANEOUS
  Filled 2013-08-16 (×8): qty 0.4

## 2013-08-16 MED ORDER — ACETAMINOPHEN 325 MG PO TABS: 650.0000 mg | ORAL_TABLET | Freq: Four times a day (QID) | ORAL | Status: DC | PRN

## 2013-08-16 MED ORDER — ACETAMINOPHEN 650 MG RE SUPP: 650.0000 mg | Freq: Four times a day (QID) | RECTAL | Status: DC | PRN

## 2013-08-16 MED ORDER — LORATADINE 10 MG PO TABS
10.0000 mg | ORAL_TABLET | Freq: Every day | ORAL | Status: DC
  Filled 2013-08-16 (×2): qty 1

## 2013-08-16 MED ORDER — ACETAMINOPHEN 500 MG PO TABS
500.0000 mg | ORAL_TABLET | Freq: Three times a day (TID) | ORAL | Status: DC
  Administered 2013-08-16: 500 mg via ORAL
  Filled 2013-08-16: qty 1

## 2013-08-16 MED ORDER — SERTRALINE HCL 25 MG PO TABS: 25.0000 mg | ORAL_TABLET | Freq: Every day | ORAL | Status: DC

## 2013-08-16 MED ORDER — ALPRAZOLAM 0.25 MG PO TABS
0.2500 mg | ORAL_TABLET | Freq: Three times a day (TID) | ORAL | Status: DC | PRN
  Administered 2013-08-18: 0.25 mg via ORAL
  Filled 2013-08-16: qty 1

## 2013-08-16 MED ORDER — ONDANSETRON HCL 4 MG/2ML IJ SOLN
4.0000 mg | Freq: Four times a day (QID) | INTRAMUSCULAR | Status: DC | PRN
Start: 2013-08-16 — End: 2013-08-23
  Administered 2013-08-17 – 2013-08-20 (×3): 4 mg via INTRAVENOUS
  Filled 2013-08-16 (×4): qty 2

## 2013-08-16 MED ORDER — METOPROLOL TARTRATE 1 MG/ML IV SOLN
2.5000 mg | Freq: Four times a day (QID) | INTRAVENOUS | Status: DC
  Administered 2013-08-16 – 2013-08-17 (×4): 2.5 mg via INTRAVENOUS
  Filled 2013-08-16 (×7): qty 5

## 2013-08-16 MED ORDER — ONDANSETRON HCL 4 MG PO TABS
4.0000 mg | ORAL_TABLET | Freq: Four times a day (QID) | ORAL | Status: DC | PRN
  Administered 2013-08-19: 4 mg via ORAL

## 2013-08-16 MED ORDER — SODIUM CHLORIDE 0.9 % IV BOLUS (SEPSIS)
1000.0000 mL | Freq: Once | INTRAVENOUS | Status: AC
  Administered 2013-08-16: 1000 mL via INTRAVENOUS

## 2013-08-16 MED ORDER — GABAPENTIN 100 MG PO CAPS
100.0000 mg | ORAL_CAPSULE | Freq: Three times a day (TID) | ORAL | Status: DC
  Administered 2013-08-16: 100 mg via ORAL
  Filled 2013-08-16 (×2): qty 1

## 2013-08-16 MED ORDER — ONDANSETRON HCL 4 MG/2ML IJ SOLN
4.0000 mg | Freq: Once | INTRAMUSCULAR | Status: AC
  Administered 2013-08-16: 4 mg via INTRAVENOUS
  Filled 2013-08-16: qty 2

## 2013-08-16 MED ORDER — CALCIUM CARBONATE-VITAMIN D 500-200 MG-UNIT PO TABS
1.0000 | ORAL_TABLET | Freq: Two times a day (BID) | ORAL | Status: DC
Start: 2013-08-16 — End: 2013-08-16
  Filled 2013-08-16: qty 1

## 2013-08-16 MED ORDER — METRONIDAZOLE IN NACL 5-0.79 MG/ML-% IV SOLN
500.0000 mg | Freq: Three times a day (TID) | INTRAVENOUS | Status: DC
  Administered 2013-08-16 – 2013-08-21 (×15): 500 mg via INTRAVENOUS
  Filled 2013-08-16 (×16): qty 100

## 2013-08-16 MED ORDER — NITROFURANTOIN MONOHYD MACRO 100 MG PO CAPS: 100.0000 mg | ORAL_CAPSULE | Freq: Every day | ORAL | Status: DC

## 2013-08-16 MED ORDER — METOPROLOL TARTRATE 25 MG PO TABS
25.0000 mg | ORAL_TABLET | Freq: Once | ORAL | Status: AC
  Administered 2013-08-16: 25 mg via ORAL
  Filled 2013-08-16: qty 1

## 2013-08-16 MED ORDER — PREDNISONE 1 MG PO TABS
2.0000 mg | ORAL_TABLET | Freq: Every day | ORAL | Status: DC
  Administered 2013-08-17 – 2013-08-23 (×7): 2 mg via ORAL
  Filled 2013-08-16 (×9): qty 2

## 2013-08-16 MED ORDER — SIMETHICONE 80 MG PO CHEW
80.0000 mg | CHEWABLE_TABLET | Freq: Four times a day (QID) | ORAL | Status: DC | PRN
  Filled 2013-08-16: qty 1

## 2013-08-16 MED ORDER — ENSURE PLUS PO LIQD: 237.0000 mL | Freq: Two times a day (BID) | ORAL | Status: DC

## 2013-08-16 NOTE — ED Provider Notes (Signed)
CSN: [id number]     Arrival date & time 08/16/13  1103 History   First MD Initiated Contact with Patient 08/16/13 1110     Chief Complaint  Patient presents with  . Hypertension  . Hyperglycemia     (Consider location/radiation/quality/duration/timing/severity/associated sxs/prior Treatment) HPI Comments: EMS reported nursing home sending pt today due to elevated blood sugar and blood pressure.  Pt states she felt fine this morning but after ambulance ride says she does not feel good but is not specific.  She denies vomiting, abd pain, diarrhea, chest pain or SOB.  Has not eaten this morning but states she does not eat breakfast usually.  As far as she knows she has taken her morning medications.  The history is provided by the nursing home, the EMS personnel and the patient. The history is limited by the condition of the patient and the absence of a caregiver.    Past Medical History  Diagnosis Date  . Hypertension   . Arthritis   . Depression   . Allergic rhinitis due to other allergen   . Other and unspecified hyperlipidemia   . Dementia, unspecified, without behavioral disturbance   . Alzheimer's disease   . Unspecified arthropathy, shoulder region   . Anorexia   . Undiagnosed cardiac murmurs   . GERD (gastroesophageal reflux disease)   . Chronic constipation    Past Surgical History  Procedure Laterality Date  . Back surgery    . Tonsillectomy     No family history on file. History  Substance Use Topics  . Smoking status: Former Games developermoker  . Smokeless tobacco: Not on file  . Alcohol Use: No   OB History   Grav Para Term Preterm Abortions TAB SAB Ect Mult Living                 Review of Systems  Unable to perform ROS     Allergies  Ciprofloxacin  Home Medications   Prior to Admission medications   Medication Sig Start Date End Date Taking? Authorizing Provider  acetaminophen (TYLENOL) 500 MG tablet Take 500 mg by mouth 3 (three) times daily.    Historical  Provider, MD  ALPRAZolam Prudy Feeler(XANAX) 0.25 MG tablet Take 1 tablet (0.25 mg total) by mouth 2 (two) times daily as needed for sleep or anxiety. 06/15/12   Elease EtienneAnand D Hongalgi, MD  ALPRAZolam Prudy Feeler(XANAX) 0.5 MG tablet Take 1 tablet (0.5 mg total) by mouth at bedtime. 06/15/12   Elease EtienneAnand D Hongalgi, MD  atorvastatin (LIPITOR) 20 MG tablet Take 20 mg by mouth daily.      Historical Provider, MD  calcium-vitamin D (OSCAL WITH D) 500-200 MG-UNIT per tablet Take 1 tablet by mouth 2 (two) times daily. 08/20/11 08/19/12  Vassie Lollarlos Madera, MD  CRANBERRY EXTRACT PO Take 475 mg by mouth 2 (two) times daily.     Historical Provider, MD  Ensure Plus (ENSURE PLUS) LIQD Take 237 mLs by mouth 2 (two) times daily.    Historical Provider, MD  esomeprazole (NEXIUM) 40 MG capsule Take 40 mg by mouth daily before breakfast.      Historical Provider, MD  gabapentin (NEURONTIN) 100 MG capsule Take 2 capsules (200 mg total) by mouth 3 (three) times daily. 06/15/12   Elease EtienneAnand D Hongalgi, MD  loperamide (IMODIUM) 2 MG capsule Take 2 mg by mouth 4 (four) times daily as needed for diarrhea or loose stools.    Historical Provider, MD  loratadine (CLARITIN) 10 MG tablet Take 10 mg by mouth daily.  Historical Provider, MD  Melatonin 3 MG TABS Take 1 tablet by mouth at bedtime.    Historical Provider, MD  metoprolol tartrate (LOPRESSOR) 25 MG tablet Take 12.5 mg by mouth 2 (two) times daily.    Historical Provider, MD  ondansetron (ZOFRAN) 4 MG tablet Take 4 mg by mouth every 6 (six) hours as needed for nausea.    Historical Provider, MD  senna (SENOKOT) 8.6 MG TABS Take 1 tablet by mouth 2 (two) times daily.    Historical Provider, MD  senna-docusate (SENOKOT-S) 8.6-50 MG per tablet Take 1 tablet by mouth at bedtime.    Historical Provider, MD  sertraline (ZOLOFT) 50 MG tablet Take 50 mg by mouth daily.    Historical Provider, MD  simethicone (MYLICON) 80 MG chewable tablet Chew 80 mg by mouth every 6 (six) hours as needed for flatulence.     Historical  Provider, MD  traMADol (ULTRAM) 50 MG tablet Take 50 mg by mouth 3 (three) times daily.    Historical Provider, MD   BP 212/103  Pulse 80  Temp(Src) 99.1 F (37.3 C) (Oral)  Resp 19  SpO2 98% Physical Exam  Nursing note and vitals reviewed. Constitutional: She is oriented to person, place, and time. She appears well-developed and well-nourished. No distress.  HENT:  Head: Normocephalic.    Mouth/Throat: Oropharynx is clear and moist. Mucous membranes are dry.  Whispering voice which is slightly hoarse  Eyes: Conjunctivae and EOM are normal. Pupils are equal, round, and reactive to light.  Neck: Normal range of motion. Neck supple.  Cardiovascular: Normal rate, regular rhythm and intact distal pulses.   Murmur heard.  Systolic murmur is present with a grade of 3/6  Heard best at the LSB  Pulmonary/Chest: Effort normal and breath sounds normal. No respiratory distress. She has no wheezes. She has no rales.  Abdominal: Soft. She exhibits no distension. There is no tenderness. There is no rebound and no guarding.  Musculoskeletal: Normal range of motion. She exhibits no edema and no tenderness.  Neurological: She is alert and oriented to person, place, and time.  Skin: Skin is warm and dry. No rash noted. No erythema.  Psychiatric: She has a normal mood and affect. Her behavior is normal.    ED Course  Procedures (including critical care time) Labs Review Labs Reviewed  CBC WITH DIFFERENTIAL - Abnormal; Notable for the following:    WBC 18.6 (*)    Hemoglobin 15.2 (*)    Neutrophils Relative % 87 (*)    Neutro Abs 16.0 (*)    Lymphocytes Relative 6 (*)    Monocytes Absolute 1.4 (*)    All other components within normal limits  URINALYSIS, ROUTINE W REFLEX MICROSCOPIC - Abnormal; Notable for the following:    Glucose, UA >1000 (*)    Hgb urine dipstick MODERATE (*)    Ketones, ur >80 (*)    Protein, ur 100 (*)    All other components within normal limits  COMPREHENSIVE  METABOLIC PANEL - Abnormal; Notable for the following:    Sodium 135 (*)    Potassium 3.2 (*)    Chloride 93 (*)    CO2 18 (*)    Glucose, Bld 224 (*)    Total Protein 8.4 (*)    GFR calc non Af Amer 71 (*)    GFR calc Af Amer 82 (*)    Anion gap 24 (*)    All other components within normal limits  URINE MICROSCOPIC-ADD ON - Abnormal;  Notable for the following:    Bacteria, UA FEW (*)    Casts HYALINE CASTS (*)    All other components within normal limits  I-STAT CG4 LACTIC ACID, ED - Abnormal; Notable for the following:    Lactic Acid, Venous 4.39 (*)    All other components within normal limits  CBG MONITORING, ED  Rosezena Sensor, ED    Imaging Review Dg Chest 2 View  08/16/2013   CLINICAL DATA:  htn  EXAM: CHEST  2 VIEW  COMPARISON:  Two-view chest 06/11/2012  FINDINGS: Low lung volumes. Cardiac silhouette stable. Tortuous ectatic aorta containing atherosclerotic calcifications. Mild prominence of interstitial markings. The lungs are otherwise clear. Degenerative changes within the shoulders.  IMPRESSION: Chronic bronchitic changes without acute cardiopulmonary disease.   Electronically Signed   By: Salome Holmes M.D.   On: 08/16/2013 13:22   Ct Head Wo Contrast  08/16/2013   CLINICAL DATA:  Nausea, vomiting, status post fall 2 weeks ago.  EXAM: CT HEAD WITHOUT CONTRAST  TECHNIQUE: Contiguous axial images were obtained from the base of the skull through the vertex without intravenous contrast.  COMPARISON:  June 11, 2012  FINDINGS: There is chronic diffuse atrophy. Chronic bilateral periventricular white matter small vessel ischemic changes identified. There is no midline shift, hydrocephalus, or mass. No acute hemorrhage or acute transcortical infarct is identified. The bony calvarium is intact. The visualized sinuses are clear.  IMPRESSION: No focal acute intracranial abnormality identified. Chronic diffuse atrophy. Chronic bilateral periventricular white matter small vessel  ischemic change.   Electronically Signed   By: Sherian Rein M.D.   On: 08/16/2013 13:20     EKG Interpretation   Date/Time:  Thursday August 16 2013 11:09:54 EDT Ventricular Rate:  78 PR Interval:  159 QRS Duration: 97 QT Interval:  429 QTC Calculation: 489 R Axis:   -14 Text Interpretation:  Sinus rhythm Atrial premature complexes LVH with  secondary repolarization abnormality Anterior Q waves, possibly due to LVH  Baseline wander in lead(s) I III aVL No significant change since last  tracing Confirmed by Anitra Lauth  MD, Alphonzo Lemmings ([id number]) on 08/16/2013 12:56:01 PM      MDM   Final diagnoses:  None   Patient sent from nursing home due to hypertension and hyperglycemia today. Patient states she felt fine this morning but after the ambulance ride here she is vague and says she just doesn't feel right. She denies eating anything this morning but states she normally does not eat breakfast. She is taking all of her morning medication and denies nausea or vomiting. She has no focal pain on exam and is neurovascularly intact.  Patient has no history of hyperglycemia or diabetes. We'll recheck a CBG. Patient does have a history of hypertension and takes metoprolol.  Initial blood pressure here is 212/103. We'll recheck and if it remains elevated treat blood pressure.  Also temperature here is 99.1 we'll do a rectal temperature to ensure patient is not febrile.  CBC, CMP, UA, troponin, CBG, chest x-ray, EKG pending  12:53 PM Leukocytosis of 18,000, mild hyperglycemia of 224 and UA with ketones.  Lactate was 4.4 and pt is dehydrated. EKG unchanged.  Pt's son and POA arrived and states she has been vomiting with some loose stool since last night.  However a few weeks ago she fell and hit her face and possible head.  Pt was not evaluated for this.  Will ensure no intracranial bleeds or elevated ICP as cause of vomiting.  NO recent  abx and no signs of UA.  Pt does not have abd pain on exam.  1:23  PM CT of head without signs of bleed and CXR without acute findings.  Will admit for dehydration and lactic acidosis.  Gwyneth Sprout, MD 08/16/13 1327

## 2013-08-16 NOTE — H&P (Addendum)
History and Physical:    Angelica KirksRuby Belote ZOX:[id number] DOB: 1915-08-10 DOA: 08/16/2013  Referring physician: Dr. Anitra LauthPlunkett PCP: MAZZOCCHI, Rise MuANNMARIE, MD   Chief Complaint: Nausea, vomiting, diarrhea  History of Present Illness:   Angelica Marshall is an 80 y.o. female with a PMH of Alzheimer's dementia, hypertension, frequent UTIs on chronic Macrobid suppressive therapy who was sent to the ER from her ALF for evaluation of a 24 hour history of nausea, vomiting and diarrhea.  The patient's dementia precludes obtaining any meaningful history from her. She is accompanied by multiple family members who provide some information about her recent condition. Up until her illness began, her appetite was normal. She's had some progressive weakness and fatigue, ambulate with a walker at baseline, but has had some issues with falling because her knees give out and she gets shaky. The patient is unable to tell me if she is having any symptoms at present. Upon initial evaluation in the ED, the patient was noted to have a WBC of 18.6, serum lactate of 4.39, and a urinalysis negative for nitrites and leukocytes.  ROS:   Constitutional: No fever, no chills;  Appetite normal; No weight loss, no weight gain, + increased weakness and fatigue.  HEENT: No blurry vision, no diplopia, no pharyngitis, no dysphagia CV: No chest pain, no palpitations, no PND, no orthopnea, no edema.  Resp: No SOB, no cough, no pleuritic pain. GI: + nausea, + vomiting, + diarrhea, no melena, no hematochezia, no constipation, no abdominal pain.  GU: No dysuria, no hematuria, no frequency, no urgency. MSK: no myalgias, no arthralgias.  Neuro:  No headache, no focal neurological deficits, no history of seizures.  Psych: No depression, no anxiety.  Endo: No heat intolerance, no cold intolerance, no polyuria, no polydipsia  Skin: No rashes, no skin lesions.  Heme: No easy bruising.  Travel history: No recent travel.   Past Medical History:   Past  Medical History  Diagnosis Date  . Hypertension   . Arthritis   . Depression   . Allergic rhinitis due to other allergen   . Other and unspecified hyperlipidemia   . Dementia, unspecified, without behavioral disturbance   . Alzheimer's disease   . Unspecified arthropathy, shoulder region   . Anorexia   . Undiagnosed cardiac murmurs   . GERD (gastroesophageal reflux disease)   . Chronic constipation     Past Surgical History:   Past Surgical History  Procedure Laterality Date  . Back surgery    . Tonsillectomy    . Abdominal hysterectomy      Social History:   History   Social History  . Marital Status: Divorced    Spouse Name: N/A    Number of Children: 4  . Years of Education: N/A   Occupational History  . Not on file.   Social History Main Topics  . Smoking status: Former Games developermoker  . Smokeless tobacco: Never Used  . Alcohol Use: No  . Drug Use: No  . Sexual Activity: No   Other Topics Concern  . Not on file   Social History Narrative   [facility].  Ambulates with a walker.  Has macular degeneration which limits her activity.    Family history:   Family History  Problem Relation Age of Onset  . Diabetes Brother   . Alcoholism Father   . Crohn's disease Son     Died at age 78 from GI complications    Allergies   Ciprofloxacin  Current Medications:   Prior to Admission medications   Medication Sig Start Date End Date Taking? Authorizing Provider  acetaminophen (TYLENOL) 500 MG tablet Take 500 mg by mouth 3 (three) times daily.   Yes Historical Provider, MD  ALPRAZolam (XANAX) 0.25 MG tablet Take 0.25 mg by mouth 3 (three) times daily as needed for anxiety or sleep (take 2 times daily as needed but scheduled at bedtime).   Yes Historical Provider, MD  atorvastatin (LIPITOR) 20 MG tablet Take 20 mg by mouth daily.     Yes Historical Provider, MD  calcium-vitamin D (OSCAL WITH D) 500-200 MG-UNIT per tablet Take 1 tablet by mouth 2  (two) times daily.   Yes Historical Provider, MD  CRANBERRY EXTRACT PO Take 475 mg by mouth 2 (two) times daily.    Yes Historical Provider, MD  Ensure Plus (ENSURE PLUS) LIQD Take 237 mLs by mouth 2 (two) times daily.   Yes Historical Provider, MD  esomeprazole (NEXIUM) 40 MG capsule Take 40 mg by mouth daily before breakfast.     Yes Historical Provider, MD  gabapentin (NEURONTIN) 100 MG capsule Take 100 mg by mouth 3 (three) times daily.   Yes Historical Provider, MD  loratadine (CLARITIN) 10 MG tablet Take 10 mg by mouth daily.   Yes Historical Provider, MD  Melatonin 3 MG TABS Take 1 tablet by mouth at bedtime.   Yes Historical Provider, MD  metoprolol tartrate (LOPRESSOR) 25 MG tablet Take 12.5 mg by mouth 2 (two) times daily.   Yes Historical Provider, MD  nitrofurantoin, macrocrystal-monohydrate, (MACROBID) 100 MG capsule Take 100 mg by mouth daily.   Yes Historical Provider, MD  ondansetron (ZOFRAN) 4 MG tablet Take 4 mg by mouth every 6 (six) hours as needed for nausea.   Yes Historical Provider, MD  predniSONE (DELTASONE) 1 MG tablet Take 2 mg by mouth daily with breakfast.   Yes Historical Provider, MD  senna (SENOKOT) 8.6 MG TABS Take 1 tablet by mouth 2 (two) times daily.   Yes Historical Provider, MD  sertraline (ZOLOFT) 50 MG tablet Take 25 mg by mouth daily.    Yes Historical Provider, MD  simethicone (MYLICON) 80 MG chewable tablet Chew 80 mg by mouth every 6 (six) hours as needed for flatulence.    Yes Historical Provider, MD  loperamide (IMODIUM) 2 MG capsule Take 2 mg by mouth 4 (four) times daily as needed for diarrhea or loose stools.    Historical Provider, MD  traMADol (ULTRAM) 50 MG tablet Take 50 mg by mouth 3 (three) times daily.    Historical Provider, MD    Physical Exam:   Filed Vitals:   08/16/13 1400 08/16/13 1404 08/16/13 1450 08/16/13 1529  BP: 173/148 160/97 157/75   Pulse: 78 76 55   Temp:   98.5 F (36.9 C)   TempSrc:   Oral   Resp: 15 17 18    Height:     5\' 6"  (1.676 m)  Weight:    60.1 kg (132 lb 7.9 oz)  SpO2: 88% 93% 100%      Physical Exam: Blood pressure 157/75, pulse 55, temperature 98.5 F (36.9 C), temperature source Oral, resp. rate 18, height 5\' 6"  (1.676 m), weight 60.1 kg (132 lb 7.9 oz), SpO2 100.00%. Gen: Lethargic. No acute distress. Head: Normocephalic, atraumatic. Eyes: PERRL, EOMI, sclerae nonicteric. Mouth: Oropharynx clear with dry mucous membranes. Neck: Supple, no thyromegaly, no lymphadenopathy, no jugular venous distention. Chest: Lungs clear but diminished throughout. CV: Heart sounds are  regular with a grade 3/6 systolic ejection murmur. Abdomen: Soft, nontender, nondistended with normal active bowel sounds. Extremities: Extremities without clubbing, edema, or cyanosis. Skin: Warm and dry. Neuro: Disoriented and lethargic, cranial nerves II through XII grossly intact. Psych: Mood and affect flat.   Data Review:    Labs: Basic Metabolic Panel:  Recent Labs Lab 08/16/13 1132  NA 135*  K 3.2*  CL 93*  CO2 18*  GLUCOSE 224*  BUN 14  CREATININE 0.70  CALCIUM 9.7   Liver Function Tests:  Recent Labs Lab 08/16/13 1132  AST 21  ALT 12  ALKPHOS 102  BILITOT 1.2  PROT 8.4*  ALBUMIN 4.9   CBC:  Recent Labs Lab 08/16/13 1132  WBC 18.6*  NEUTROABS 16.0*  HGB 15.2*  HCT 43.8  MCV 91.3  PLT 261   CBG:  Recent Labs Lab 08/16/13 1419  GLUCAP 173*    Radiographic Studies: Dg Chest 2 View  08/16/2013   CLINICAL DATA:  htn  EXAM: CHEST  2 VIEW  COMPARISON:  Two-view chest 06/11/2012  FINDINGS: Low lung volumes. Cardiac silhouette stable. Tortuous ectatic aorta containing atherosclerotic calcifications. Mild prominence of interstitial markings. The lungs are otherwise clear. Degenerative changes within the shoulders.  IMPRESSION: Chronic bronchitic changes without acute cardiopulmonary disease.   Electronically Signed   By: Salome Holmes M.D.   On: 08/16/2013 13:22   Ct Head Wo  Contrast  08/16/2013   CLINICAL DATA:  Nausea, vomiting, status post fall 2 weeks ago.  EXAM: CT HEAD WITHOUT CONTRAST  TECHNIQUE: Contiguous axial images were obtained from the base of the skull through the vertex without intravenous contrast.  COMPARISON:  June 11, 2012  FINDINGS: There is chronic diffuse atrophy. Chronic bilateral periventricular white matter small vessel ischemic changes identified. There is no midline shift, hydrocephalus, or mass. No acute hemorrhage or acute transcortical infarct is identified. The bony calvarium is intact. The visualized sinuses are clear.  IMPRESSION: No focal acute intracranial abnormality identified. Chronic diffuse atrophy. Chronic bilateral periventricular white matter small vessel ischemic change.   Electronically Signed   By: Sherian Rein M.D.   On: 08/16/2013 13:20    EKG: Independently reviewed. Sinus rhythm; Atrial premature complexes; LVH with secondary repolarization abnormality; Anterior Q waves, possibly due to LVH; Baseline wander in lead(s) I III aVL; No significant change since last tracing.  Assessment/Plan:   Principal Problem:   Sepsis from a suspected GI source with nausea, vomiting, diarrhea, lactic acidosis and leukocytosis / metabolic encephalopathy  Source of sepsis not entirely clear, but with leukocytosis, lactic acidosis and GI symptoms, suspect a GI source.  Rule out C. difficile given chronic antibiotic treatment with Macrobid.  GI pathogen panel requested.  Hydrate and treat with empiric Flagyl for now. Hold Macrobid.  Chest 2 views of the abdomen.  Active Problems:   Dehydration  Hydrate cautiously given systolic murmur suspicious for aortic stenosis.    HTN (hypertension)  The patient's hypertension has likely been triggered by inability to take her oral antihypertensives secondary to nausea and vomiting. We'll give metoprolol IV.    Hypokalemia  Potassium added to IV fluids.    Weakness generalized / falls  frequently / Alzheimer's disease  Multifactorial with Alzheimer's dementia and frailty contributory.  Treat underlying dehydration and infection.  PT/OT evaluations when more stable.  CT of the head done to rule out head injury, which was negative.    Nausea and vomiting / diarrhea  Hydrate and provide anti-emetics as needed.  Send stools for C. difficile PCR and GI pathogen panel.  Bowel rest with clear liquid diet.    Hyperglycemia  Check hemoglobin A1c.    DVT prophylaxis  Lovenox ordered.  Code Status: DO NOT RESUSCITATE. Family Communication: Multiple family at bedside. Disposition Plan: ALF versus SNF when stable.  Time spent: 70 minutes.  Barney Gertsch [facility] Pager [phone] Cell: [phone]   If 7PM-7AM, please contact night-coverage [email or web address] Password [email or web address] 08/16/2013, 5:00 PM    **Disclaimer: This note was dictated with voice recognition software. Similar sounding words can inadvertently be transcribed and this note may contain transcription errors which may not have been corrected upon publication of note.**

## 2013-08-16 NOTE — ED Notes (Signed)
Per EMS: pt from [facility]. Pt here for HTN and hyperglycemia. Pt has no complaints until after ride in EMS, thinks ride may have shaken her up.

## 2013-08-16 NOTE — ED Notes (Addendum)
CBG was 215, did not cross over in system.

## 2013-08-16 NOTE — ED Notes (Signed)
Bed: WA03 Expected date:  Expected time:  Means of arrival:  Comments: EMS_fall 

## 2013-08-16 NOTE — ED Notes (Signed)
Dr Anitra LauthPlunkett aware of elevated I stat Lactic.

## 2013-08-16 NOTE — ED Notes (Signed)
Patient transported to X-ray 

## 2013-08-16 NOTE — Progress Notes (Signed)
Report received , Pt arrived unit from ED accompanied by her family, alert and oriented to self. MD notified of pt's location, will continue with current plan of care.

## 2013-08-17 LAB — CBC
HCT: 38.7 % (ref 36.0–46.0)
HEMOGLOBIN: 12.9 g/dL (ref 12.0–15.0)
MCH: 31.2 pg (ref 26.0–34.0)
MCHC: 33.3 g/dL (ref 30.0–36.0)
MCV: 93.5 fL (ref 78.0–100.0)
Platelets: 224 10*3/uL (ref 150–400)
RBC: 4.14 MIL/uL (ref 3.87–5.11)
RDW: 14.6 % (ref 11.5–15.5)
WBC: 12.5 10*3/uL — AB (ref 4.0–10.5)

## 2013-08-17 LAB — BASIC METABOLIC PANEL
ANION GAP: 15 (ref 5–15)
BUN: 14 mg/dL (ref 6–23)
CHLORIDE: 103 meq/L (ref 96–112)
CO2: 22 mEq/L (ref 19–32)
Calcium: 8.6 mg/dL (ref 8.4–10.5)
Creatinine, Ser: 0.84 mg/dL (ref 0.50–1.10)
GFR calc Af Amer: 66 mL/min — ABNORMAL LOW (ref >90)
GFR calc non Af Amer: 57 mL/min — ABNORMAL LOW (ref >90)
Glucose, Bld: 106 mg/dL — ABNORMAL HIGH (ref 70–99)
Potassium: 3.5 mEq/L — ABNORMAL LOW (ref 3.7–5.3)
Sodium: 140 mEq/L (ref 137–147)

## 2013-08-17 MED ORDER — LIP MEDEX EX OINT
TOPICAL_OINTMENT | CUTANEOUS | Status: AC
  Administered 2013-08-17: 21:00:00
  Filled 2013-08-17: qty 7

## 2013-08-17 MED ORDER — METOPROLOL TARTRATE 12.5 MG HALF TABLET
12.5000 mg | ORAL_TABLET | Freq: Two times a day (BID) | ORAL | Status: DC
  Administered 2013-08-17 – 2013-08-23 (×11): 12.5 mg via ORAL
  Filled 2013-08-17 (×14): qty 1

## 2013-08-17 NOTE — Progress Notes (Signed)
Clinical Social Work Department BRIEF PSYCHOSOCIAL ASSESSMENT 08/17/2013  Patient:  Angelica Marshall, Angelica Marshall     Account Number:  [id number]     Admit date:  08/16/2013  Clinical Social Worker:  Earlie Server  Date/Time:  08/17/2013 02:00 PM  Referred by:  Physician  Date Referred:  08/17/2013 Referred for  ALF Placement   Other Referral:   Interview type:  Family Other interview type:   Patient unable to participate in assessment.    PSYCHOSOCIAL DATA Living Status:  FACILITY Admitted from facility:  [facility] Level of care:  Assisted Living Primary support name:  Glendell Docker Primary support relationship to patient:  CHILD, ADULT Degree of support available:   Strong    CURRENT CONCERNS Current Concerns  Post-Acute Placement   Other Concerns:    SOCIAL WORK ASSESSMENT / PLAN CSW received referral in order to complete psychosocial assessment. CSW reviewed chart and met with patient and son at bedside. CSW introduced myself and explained role.    Patient was sleeping and per RN staff, patient has been confused today. Son reports that patient has been living at [facility] and receiving good there. Son reports he is unsure if patient can return to ALF at DC and reports he will talk with MD to determine if patient needs SNF at DC. Patient was discussed during progression meeting and MD reports he will order PT to evaluate patient's needs. CSW explained to son that PT evaluation could assist with determining patient's needs as well. Son reports that patient has stayed at [facility] in the past but does not want to seek SNF placement until PT has evaluated and he has spoken to MD. CSW called and left a message with ALF to determine patient's prior level of functioning.    CSW completed FL2 and placed in chart and CSW will continue to follow to assist with disposition needs.   Assessment/plan status:  Psychosocial Support/Ongoing Assessment of Needs Other assessment/ plan:    Information/referral to community resources:   ALF vs SNF    PATIENT'S/FAMILY'S RESPONSE TO PLAN OF CARE: Patient unable to participate in assessment. Patient's son engaged and worried about patient's wellbeing. Son reports that patient has been doing well but needs to talk with family about level of care at DC. Son agreeable to talk with family and discuss plans with MD. Pandora Leiter has CSW contact information and agreeable for CSW to continue to follow.       [place], [place] [phone]

## 2013-08-17 NOTE — Progress Notes (Signed)
[facility] PROGRESS NOTE  Angelica KirksRuby Yorio ZOX:[id number] DOB: June 07, 1915 DOA: 08/16/2013 PCP: MAZZOCCHI, Rise MuANNMARIE, MD  Assessment/Plan: 1. Suspected Infectious Gastroenteritis -Patient having multiple episodes of N/V and diarrhea at her facility -Improved this morning -I doubt this is related to Cdiff colitis -Continue supportive care, IV fluids, Flagyl, follow up on stool studies  2. Dehydration -Resulted from GI illness -Continue NS running at 75 mL/hour  3.  Functional Decline -Patient with history of cognitive impairment -Likely precipitated by GI illness and dehydration  4. HTN -Tolerating PO -Will restart metoprolol 12.5 mg PO BID  Code Status: DNR Family Communication: Spoke with her son at bedside Disposition Plan: Continue supportive care   Antibiotics:  Flagyl  HPI/Subjective: Angelica KirksRuby Car is an 80 y.o. female with a PMH of Alzheimer's dementia, hypertension, frequent UTIs on chronic Macrobid suppressive therapy who was sent to the ER from her ALF for evaluation of a 24 hour history of nausea, vomiting and diarrhea. She remains confused and disoriented, although seems that diarrhea and N/V have since resolved. History limited given cognitive impairment  Objective: Filed Vitals:   08/17/13 0612  BP: 147/75  Pulse: 67  Temp: 99 F (37.2 C)  Resp: 18    Intake/Output Summary (Last 24 hours) at 08/17/13 1255 Last data filed at 08/17/13 1212  Gross per 24 hour  Intake    240 ml  Output      0 ml  Net    240 ml   Filed Weights   08/16/13 1529  Weight: 60.1 kg (132 lb 7.9 oz)    Exam:   General:  Confused, disoriented, having difficulties following commands  Cardiovascular: Regular rate and rhythm, normal S1S2  Respiratory: Clear to auscultation, no wheezing  Abdomen: Soft, nontender, nondistended  Musculoskeletal: No edema  Data Reviewed: Basic Metabolic Panel:  Recent Labs Lab 08/16/13 1132 08/17/13 0515  NA 135* 140  K 3.2*  3.5*  CL 93* 103  CO2 18* 22  GLUCOSE 224* 106*  BUN 14 14  CREATININE 0.70 0.84  CALCIUM 9.7 8.6   Liver Function Tests:  Recent Labs Lab 08/16/13 1132  AST 21  ALT 12  ALKPHOS 102  BILITOT 1.2  PROT 8.4*  ALBUMIN 4.9   No results found for this basename: LIPASE, AMYLASE,  in the last 168 hours No results found for this basename: AMMONIA,  in the last 168 hours CBC:  Recent Labs Lab 08/16/13 1132 08/17/13 0515  WBC 18.6* 12.5*  NEUTROABS 16.0*  --   HGB 15.2* 12.9  HCT 43.8 38.7  MCV 91.3 93.5  PLT 261 224   Cardiac Enzymes: No results found for this basename: CKTOTAL, CKMB, CKMBINDEX, TROPONINI,  in the last 168 hours BNP (last 3 results) No results found for this basename: PROBNP,  in the last 8760 hours CBG:  Recent Labs Lab 08/16/13 1130 08/16/13 1419  GLUCAP 215* 173*    No results found for this or any previous visit (from the past 240 hour(s)).   Studies: Dg Chest 2 View  08/16/2013   CLINICAL DATA:  htn  EXAM: CHEST  2 VIEW  COMPARISON:  Two-view chest 06/11/2012  FINDINGS: Low lung volumes. Cardiac silhouette stable. Tortuous ectatic aorta containing atherosclerotic calcifications. Mild prominence of interstitial markings. The lungs are otherwise clear. Degenerative changes within the shoulders.  IMPRESSION: Chronic bronchitic changes without acute cardiopulmonary disease.   Electronically Signed   By: Salome HolmesHector  Cooper M.D.   On: 08/16/2013 13:22   Ct Head Wo Contrast  08/16/2013   CLINICAL DATA:  Nausea, vomiting, status post fall 2 weeks ago.  EXAM: CT HEAD WITHOUT CONTRAST  TECHNIQUE: Contiguous axial images were obtained from the base of the skull through the vertex without intravenous contrast.  COMPARISON:  June 11, 2012  FINDINGS: There is chronic diffuse atrophy. Chronic bilateral periventricular white matter small vessel ischemic changes identified. There is no midline shift, hydrocephalus, or mass. No acute hemorrhage or acute transcortical  infarct is identified. The bony calvarium is intact. The visualized sinuses are clear.  IMPRESSION: No focal acute intracranial abnormality identified. Chronic diffuse atrophy. Chronic bilateral periventricular white matter small vessel ischemic change.   Electronically Signed   By: Sherian ReinWei-Chen  Lin M.D.   On: 08/16/2013 13:20   Dg Abd 2 Views  08/17/2013   CLINICAL DATA:  Nausea, vomiting, diarrhea  EXAM: ABDOMEN - 2 VIEW  COMPARISON:  None.  FINDINGS: The bowel gas pattern is normal. There is no evidence of free air. The pattern of mottled lucency over the distal colon is consistent with colonic diverticula. There is no definitive urolithiasis. The sacroiliac joints are fused. There is been L3-4 and L4-5 discectomy with rod and pedicle screw fixation. No evidence of hardware compromise.  IMPRESSION: No evidence of bowel obstruction or perforation.   Electronically Signed   By: Tiburcio PeaJonathan  Watts M.D.   On: 08/17/2013 05:31    Scheduled Meds: . enoxaparin (LOVENOX) injection  40 mg Subcutaneous Q24H  . feeding supplement (ENSURE COMPLETE)  237 mL Oral BID BM  . metoprolol  2.5 mg Intravenous 4 times per day  . metronidazole  500 mg Intravenous Q8H  . predniSONE  2 mg Oral Q breakfast   Continuous Infusions: . 0.9 % NaCl with KCl 20 mEq / L 75 mL/hr at 08/17/13 [id number]    Principal Problem:   Sepsis Active Problems:   HTN (hypertension)   Dehydration   Hypokalemia   Weakness generalized   Nausea and vomiting   Falls frequently   Encephalopathy, metabolic   Alzheimer's disease   Vomiting and diarrhea   Hyperglycemia   Lactic acidosis    Time spent: 25 min    Jeralyn BennettZAMORA, Torien Ramroop  [facility] Pager [phone]. If 7PM-7AM, please contact night-coverage at [email or web address], password [email or web address] 08/17/2013, 12:55 PM  LOS: 1 day

## 2013-08-18 DIAGNOSIS — R5381 Other malaise: Secondary | ICD-10-CM

## 2013-08-18 DIAGNOSIS — R5383 Other fatigue: Secondary | ICD-10-CM

## 2013-08-18 MED ORDER — AMLODIPINE BESYLATE 5 MG PO TABS
5.0000 mg | ORAL_TABLET | Freq: Every day | ORAL | Status: DC
  Administered 2013-08-18 – 2013-08-19 (×2): 5 mg via ORAL
  Filled 2013-08-18 (×2): qty 1

## 2013-08-18 NOTE — Progress Notes (Addendum)
Notified Pt's sons about PT's recommendations.  Pt's son, Luther ParodySidney, asking that CSW begin a SNF search but also asking that CSW request that [facility] evaluate Pt for appropriateness of return.  His preference is for Pt to [facility] if they deem her to be appropriate.  CSW began a SNF search.  CSW contacted [facility] to request that they evaluate Pt.  Informed that Sandria Senteronna Brindle, Director, handles these requests and that she will not be in until Monday.  Pt's son aware and happy with this plan.  Weekday CSW to follow.  Providence CrosbyAmanda Dawnmarie Breon, LCSW Clinical Social Work [phone]

## 2013-08-18 NOTE — Progress Notes (Signed)
[facility] PROGRESS NOTE  Angelica Marshall UJW:[id number] DOB: 12-16-1915 DOA: 08/16/2013 PCP: MAZZOCCHI, Rise MuANNMARIE, MD  Assessment/Plan: 1. Suspected Infectious Gastroenteritis -Patient having multiple episodes of N/V and diarrhea at her facility -Resolving  2. Dehydration -Resulted from GI illness -On NS at 75 mL/hour  3.  Functional Decline -Patient with history of cognitive impairment -Likely precipitated by GI illness and dehydration -Improved today  4. HTN -Tolerating PO -Will restart metoprolol 12.5 mg PO BID  5. Deconditioning -Patient may require skilled nursing care. PT consulted  Code Status: DNR Family Communication: Spoke with her son at bedside Disposition Plan: PT consult as she may require SNF placement.    Antibiotics:  Flagyl  HPI/Subjective: Angelica KirksRuby Fritzsche is an 80 y.o. female with a PMH of Alzheimer's dementia, hypertension, frequent UTIs on chronic Macrobid suppressive therapy who was sent to the ER from her ALF for evaluation of a 24 hour history of nausea, vomiting and diarrhea. She remains confused and disoriented, although seems that diarrhea and N/V have since resolved. History limited given cognitive impairment  This morning she appears better, less agitated, sitting  Up had a few bites of her breakfast.   Objective: Filed Vitals:   08/17/13 2135  BP: 176/85  Pulse: 64  Temp: 99.2 F (37.3 C)  Resp: 18    Intake/Output Summary (Last 24 hours) at 08/18/13 1228 Last data filed at 08/18/13 0900  Gross per 24 hour  Intake    100 ml  Output      0 ml  Net    100 ml   Filed Weights   08/16/13 1529  Weight: 60.1 kg (132 lb 7.9 oz)    Exam:   General:  Less agitated, following commands, sitting up in bed  Cardiovascular: Regular rate and rhythm, normal S1S2  Respiratory: Clear to auscultation, no wheezing  Abdomen: Soft, nontender, nondistended  Musculoskeletal: No edema  Data Reviewed: Basic Metabolic Panel:  Recent  Labs Lab 08/16/13 1132 08/17/13 0515  NA 135* 140  K 3.2* 3.5*  CL 93* 103  CO2 18* 22  GLUCOSE 224* 106*  BUN 14 14  CREATININE 0.70 0.84  CALCIUM 9.7 8.6   Liver Function Tests:  Recent Labs Lab 08/16/13 1132  AST 21  ALT 12  ALKPHOS 102  BILITOT 1.2  PROT 8.4*  ALBUMIN 4.9   No results found for this basename: LIPASE, AMYLASE,  in the last 168 hours No results found for this basename: AMMONIA,  in the last 168 hours CBC:  Recent Labs Lab 08/16/13 1132 08/17/13 0515  WBC 18.6* 12.5*  NEUTROABS 16.0*  --   HGB 15.2* 12.9  HCT 43.8 38.7  MCV 91.3 93.5  PLT 261 224   Cardiac Enzymes: No results found for this basename: CKTOTAL, CKMB, CKMBINDEX, TROPONINI,  in the last 168 hours BNP (last 3 results) No results found for this basename: PROBNP,  in the last 8760 hours CBG:  Recent Labs Lab 08/16/13 1130 08/16/13 1419  GLUCAP 215* 173*    No results found for this or any previous visit (from the past 240 hour(s)).   Studies: Dg Chest 2 View  08/16/2013   CLINICAL DATA:  htn  EXAM: CHEST  2 VIEW  COMPARISON:  Two-view chest 06/11/2012  FINDINGS: Low lung volumes. Cardiac silhouette stable. Tortuous ectatic aorta containing atherosclerotic calcifications. Mild prominence of interstitial markings. The lungs are otherwise clear. Degenerative changes within the shoulders.  IMPRESSION: Chronic bronchitic changes without acute cardiopulmonary disease.   Electronically Signed  By: Hector  Cooper M.D.   On: 08/16/2013 13:22   Ct Head Wo Contrast  08/16/2013   CLINICAL DATA:  Nausea, vomiting,Salome Holmes status post fall 2 weeks ago.  EXAM: CT HEAD WITHOUT CONTRAST  TECHNIQUE: Contiguous axial images were obtained from the base of the skull through the vertex without intravenous contrast.  COMPARISON:  June 11, 2012  FINDINGS: There is chronic diffuse atrophy. Chronic bilateral periventricular white matter small vessel ischemic changes identified. There is no midline shift,  hydrocephalus, or mass. No acute hemorrhage or acute transcortical infarct is identified. The bony calvarium is intact. The visualized sinuses are clear.  IMPRESSION: No focal acute intracranial abnormality identified. Chronic diffuse atrophy. Chronic bilateral periventricular white matter small vessel ischemic change.   Electronically Signed   By: Sherian ReinWei-Chen  Lin M.D.   On: 08/16/2013 13:20   Dg Abd 2 Views  08/17/2013   CLINICAL DATA:  Nausea, vomiting, diarrhea  EXAM: ABDOMEN - 2 VIEW  COMPARISON:  None.  FINDINGS: The bowel gas pattern is normal. There is no evidence of free air. The pattern of mottled lucency over the distal colon is consistent with colonic diverticula. There is no definitive urolithiasis. The sacroiliac joints are fused. There is been L3-4 and L4-5 discectomy with rod and pedicle screw fixation. No evidence of hardware compromise.  IMPRESSION: No evidence of bowel obstruction or perforation.   Electronically Signed   By: Tiburcio PeaJonathan  Watts M.D.   On: 08/17/2013 05:31    Scheduled Meds: . amLODipine  5 mg Oral Daily  . enoxaparin (LOVENOX) injection  40 mg Subcutaneous Q24H  . feeding supplement (ENSURE COMPLETE)  237 mL Oral BID BM  . metoprolol tartrate  12.5 mg Oral BID  . metronidazole  500 mg Intravenous Q8H  . predniSONE  2 mg Oral Q breakfast   Continuous Infusions: . 0.9 % NaCl with KCl 20 mEq / L 75 mL/hr at 08/17/13 [id number]    Principal Problem:   Sepsis Active Problems:   HTN (hypertension)   Dehydration   Hypokalemia   Weakness generalized   Nausea and vomiting   Falls frequently   Encephalopathy, metabolic   Alzheimer's disease   Vomiting and diarrhea   Hyperglycemia   Lactic acidosis    Time spent: 25 min    Jeralyn BennettZAMORA, Donia Yokum  [facility] Pager [phone]. If 7PM-7AM, please contact night-coverage at [email or web address], password [email or web address] 08/18/2013, 12:28 PM  LOS: 2 days

## 2013-08-18 NOTE — Plan of Care (Signed)
Problem: Phase I Progression Outcomes Goal: OOB as tolerated unless otherwise ordered Outcome: Not Met (add Reason) Too weak, sat on side of bed with PT

## 2013-08-18 NOTE — Evaluation (Signed)
Physical Therapy Evaluation Patient Details Name: Angelica Marshall MRN: [id number] DOB: [birth year] Today's Date: 08/18/2013   History of Present Illness    Angelica Marshall is an 80 y.o. female with a PMH of Alzheimer's dementia, hypertension, frequent UTIs on chronic Macrobid suppressive therapy who was sent to the ER from her ALF for evaluation of a 24 hour history of nausea, vomiting and diarrhea. The patient's dementia precludes obtaining any meaningful history from her. She is accompanied by multiple family members who provide some information about her recent condition. Up until her illness began, her appetite was normal. She's had some progressive weakness and fatigue, ambulate with a walker at baseline, but has had some issues with falling because her knees give out and she gets shaky   Clinical Impression  Pt admitted as above and is currently requires assist of 2 for performance of basic mobility tasks.  Pt is limited by c/o extreme fatigue and preexisting dementia.  Pt arrived from ALF and discharge back is dependent on acute stay progress and assist level available at ALF.  Based on this evaluation, pt will likely need follow up rehab at SNF level.    Follow Up Recommendations Home health PT;SNF (Dependent on acute stay progress and level of assist at ALF)    Equipment Recommendations  None recommended by PT    Recommendations for Other Services OT consult     Precautions / Restrictions Precautions Precautions: Fall Restrictions Weight Bearing Restrictions: No      Mobility  Bed Mobility Overal bed mobility: +2 for physical assistance;Needs Assistance Bed Mobility: Supine to Sit;Sit to Supine     Supine to sit: Mod assist;+2 for physical assistance Sit to supine: Max assist;+2 for physical assistance   General bed mobility comments: Increased time required.  Pt slowly manouvered LEs over EOB but required assist to move complete transition and braing trunk to  upright  Transfers Overall transfer level:  (Pt refused attempt to stand 2* fatigue)                  Ambulation/Gait                Stairs            Wheelchair Mobility    Modified Rankin (Stroke Patients Only)       Balance Overall balance assessment: Needs assistance Sitting-balance support: Bilateral upper extremity supported Sitting balance-Leahy Scale: Fair Sitting balance - Comments: Pt sat at side of bed x 5 min with BIl UE support and min guard assist for saftey 2* fatigued state                                     Pertinent Vitals/Pain Pt c/o LE pain at one point but unable to clarify which LE or level of pain.    Home Living Family/patient expects to be discharged to:: Unsure                 Additional Comments: Pt unable to provide reliable information "I don't know"    Prior Function           Comments: Pt unable to provide reliable information "I don't know" in response to most questions     Hand Dominance   Dominant Hand: Right    Extremity/Trunk Assessment   Upper Extremity Assessment: Generalized weakness           Lower Extremity Assessment:  Generalized weakness      Cervical / Trunk Assessment: Kyphotic  Communication   Communication: No difficulties  Cognition Arousal/Alertness: Lethargic Behavior During Therapy: Flat affect Overall Cognitive Status: History of cognitive impairments - at baseline       Memory: Decreased short-term memory              General Comments      Exercises        Assessment/Plan    PT Assessment Patient needs continued PT services  PT Diagnosis Difficulty walking;Altered mental status   PT Problem List Decreased strength;Decreased activity tolerance;Decreased mobility;Decreased knowledge of use of DME  PT Treatment Interventions DME instruction;Gait training;Functional mobility training;Therapeutic activities;Therapeutic  exercise;Patient/family education   PT Goals (Current goals can be found in the Care Plan section) Acute Rehab PT Goals Patient Stated Goal: Lie down 2* fatigue PT Goal Formulation: Patient unable to participate in goal setting Time For Goal Achievement: 09/01/13 Potential to Achieve Goals: Fair    Frequency Min 3X/week   Barriers to discharge        Co-evaluation               End of Session   Activity Tolerance: Patient limited by fatigue;Patient limited by lethargy Patient left: in bed;with call bell/phone within reach Nurse Communication: Mobility status         Time: [id number] PT Time Calculation (min): 24 min   Charges:   PT Evaluation $Initial PT Evaluation Tier I: 1 Procedure PT Treatments $Therapeutic Activity: 8-22 mins   PT G Codes:          Garold Sheeler 08/18/2013, 12:41 PM

## 2013-08-19 LAB — CBC
HCT: 34.8 % — ABNORMAL LOW (ref 36.0–46.0)
HEMOGLOBIN: 11.7 g/dL — AB (ref 12.0–15.0)
MCH: 31.5 pg (ref 26.0–34.0)
MCHC: 33.6 g/dL (ref 30.0–36.0)
MCV: 93.5 fL (ref 78.0–100.0)
PLATELETS: 180 10*3/uL (ref 150–400)
RBC: 3.72 MIL/uL — AB (ref 3.87–5.11)
RDW: 14.5 % (ref 11.5–15.5)
WBC: 7.1 10*3/uL (ref 4.0–10.5)

## 2013-08-19 LAB — BASIC METABOLIC PANEL
ANION GAP: 12 (ref 5–15)
BUN: 18 mg/dL (ref 6–23)
CALCIUM: 8.1 mg/dL — AB (ref 8.4–10.5)
CO2: 23 meq/L (ref 19–32)
Chloride: 104 mEq/L (ref 96–112)
Creatinine, Ser: 0.75 mg/dL (ref 0.50–1.10)
GFR calc Af Amer: 80 mL/min — ABNORMAL LOW (ref >90)
GFR calc non Af Amer: 69 mL/min — ABNORMAL LOW (ref >90)
GLUCOSE: 129 mg/dL — AB (ref 70–99)
POTASSIUM: 3.7 meq/L (ref 3.7–5.3)
SODIUM: 139 meq/L (ref 137–147)

## 2013-08-19 MED ORDER — AMLODIPINE BESYLATE 10 MG PO TABS
10.0000 mg | ORAL_TABLET | Freq: Every day | ORAL | Status: DC
  Administered 2013-08-20 – 2013-08-23 (×4): 10 mg via ORAL
  Filled 2013-08-19 (×4): qty 1

## 2013-08-19 NOTE — Plan of Care (Signed)
Problem: Phase II Progression Outcomes Goal: Obtain order to discontinue catheter if appropriate Outcome: Not Applicable Date Met:  45/80/99 No catheter

## 2013-08-19 NOTE — Progress Notes (Signed)
[facility] PROGRESS NOTE  Angelica Marshall ZOX:[id number] DOB: [birth year] DOA: 08/16/2013 PCP: MAZZOCCHI, Rise MuANNMARIE, MD  Assessment/Plan: 1. Suspected Infectious Gastroenteritis -Patient having multiple episodes of N/V and diarrhea at her facility -Resolved  2. Dehydration -Resulted from GI illness -On NS at 75 mL/hour  3.  Functional Decline -Patient with history of cognitive impairment -Likely precipitated by GI illness and dehydration -She continues to have weakness despite resolution of GI symptoms and IV fluid resuscitation -Will repeat CBC and BMP today, check a UA  4. HTN -Tolerating PO -Remains hypertensive, will increase Norvasc to 5 mg PO q daily  5. Deconditioning -Patient may require skilled nursing care. PT consulted  Code Status: DNR Family Communication: Spoke with her son at bedside Disposition Plan: PT consult as she may require SNF placement.    Antibiotics:  Flagyl  HPI/Subjective: Angelica KirksRuby Provencher is an 80 y.o. female with a PMH of Alzheimer's dementia, hypertension, frequent UTIs on chronic Macrobid suppressive therapy who was sent to the ER from her ALF for evaluation of a 24 hour history of nausea, vomiting and diarrhea. She remains confused and disoriented, although seems that diarrhea and N/V have since resolved. History limited given cognitive impairment  She is not at her baseline, continues to be weak, po oral intake  Objective: Filed Vitals:   08/19/13 0519  BP: 171/78  Pulse: 76  Temp: 99.1 F (37.3 C)  Resp: 18    Intake/Output Summary (Last 24 hours) at 08/19/13 1212 Last data filed at 08/19/13 0941  Gross per 24 hour  Intake    960 ml  Output      0 ml  Net    960 ml   Filed Weights   08/16/13 1529  Weight: 60.1 kg (132 lb 7.9 oz)    Exam:   General:  Less agitated, following commands, sitting up in bed  Cardiovascular: Regular rate and rhythm, normal S1S2  Respiratory: Clear to auscultation, no wheezing  Abdomen:  Soft, nontender, nondistended  Musculoskeletal: No edema  Data Reviewed: Basic Metabolic Panel:  Recent Labs Lab 08/16/13 1132 08/17/13 0515  NA 135* 140  K 3.2* 3.5*  CL 93* 103  CO2 18* 22  GLUCOSE 224* 106*  BUN 14 14  CREATININE 0.70 0.84  CALCIUM 9.7 8.6   Liver Function Tests:  Recent Labs Lab 08/16/13 1132  AST 21  ALT 12  ALKPHOS 102  BILITOT 1.2  PROT 8.4*  ALBUMIN 4.9   No results found for this basename: LIPASE, AMYLASE,  in the last 168 hours No results found for this basename: AMMONIA,  in the last 168 hours CBC:  Recent Labs Lab 08/16/13 1132 08/17/13 0515 08/19/13 1141  WBC 18.6* 12.5* 7.1  NEUTROABS 16.0*  --   --   HGB 15.2* 12.9 11.7*  HCT 43.8 38.7 34.8*  MCV 91.3 93.5 93.5  PLT 261 224 180   Cardiac Enzymes: No results found for this basename: CKTOTAL, CKMB, CKMBINDEX, TROPONINI,  in the last 168 hours BNP (last 3 results) No results found for this basename: PROBNP,  in the last 8760 hours CBG:  Recent Labs Lab 08/16/13 1130 08/16/13 1419  GLUCAP 215* 173*    No results found for this or any previous visit (from the past 240 hour(s)).   Studies: No results found.  Scheduled Meds: . [START ON 08/20/2013] amLODipine  10 mg Oral Daily  . enoxaparin (LOVENOX) injection  40 mg Subcutaneous Q24H  . feeding supplement (ENSURE COMPLETE)  237 mL Oral BID  BM  . metoprolol tartrate  12.5 mg Oral BID  . metronidazole  500 mg Intravenous Q8H  . predniSONE  2 mg Oral Q breakfast   Continuous Infusions: . 0.9 % NaCl with KCl 20 mEq / L 75 mL/hr at 08/18/13 1259    Principal Problem:   Sepsis Active Problems:   HTN (hypertension)   Dehydration   Hypokalemia   Weakness generalized   Nausea and vomiting   Falls frequently   Encephalopathy, metabolic   Alzheimer's disease   Vomiting and diarrhea   Hyperglycemia   Lactic acidosis    Time spent: 25 min    Jeralyn BennettZAMORA, Merikay Lesniewski  [facility] Pager [phone]. If 7PM-7AM,  please contact night-coverage at [email or web address], password [email or web address] 08/19/2013, 12:12 PM  LOS: 3 days

## 2013-08-19 NOTE — Progress Notes (Signed)
Pt had sudden onset of N/V.  Zofran given with good resutl.  Emesis looked like undigested jello.  Pt had not eaten since 1700 tonight and then only about 10% of her dinner.  She became ill as she was turned to her side to be cleaned. She was turned very slowly but still became dizzy and threw up.  She complains of dizziness anytime she is moved.

## 2013-08-20 DIAGNOSIS — R112 Nausea with vomiting, unspecified: Secondary | ICD-10-CM

## 2013-08-20 LAB — URINALYSIS, ROUTINE W REFLEX MICROSCOPIC
Bilirubin Urine: NEGATIVE
Glucose, UA: NEGATIVE mg/dL
Hgb urine dipstick: NEGATIVE
KETONES UR: NEGATIVE mg/dL
LEUKOCYTES UA: NEGATIVE
NITRITE: NEGATIVE
PH: 6 (ref 5.0–8.0)
Protein, ur: NEGATIVE mg/dL
Specific Gravity, Urine: 1.009 (ref 1.005–1.030)
Urobilinogen, UA: 0.2 mg/dL (ref 0.0–1.0)

## 2013-08-20 LAB — CLOSTRIDIUM DIFFICILE BY PCR: CDIFFPCR: NEGATIVE

## 2013-08-20 NOTE — Progress Notes (Addendum)
Clinical Social Work Department CLINICAL SOCIAL WORK PLACEMENT NOTE 08/20/2013  Patient:  Angelica Marshall,Angelica Marshall  Account Number:  [id number] Admit date:  08/16/2013  Clinical Social Worker:  Unk LightningHOLLY Johari Bennetts, LCSW  Date/time:  08/20/2013 11:30 AM  Clinical Social Work is seeking post-discharge placement for this patient at the following level of care:   SKILLED NURSING   (*CSW will update this form in Epic as items are completed)   08/18/2013  Patient/family provided with [facility] of Clinical [facility] Work's list of facilities offering this level of care within the geographic area requested by the patient (or if unable, by the patient's family).  08/18/2013  Patient/family informed of their freedom to choose among providers that offer the needed level of care, that participate in Medicare, Medicaid or managed care program needed by the patient, have an available bed and are willing to accept the patient.  08/18/2013  Patient/family informed of [facility]' ownership interest in [facility], as well as of the fact that they are under no obligation to receive care at this facility.  PASARR submitted to EDS on existing # PASARR number received on   FL2 transmitted to all facilities in geographic area requested by pt/family on  08/18/2013 FL2 transmitted to all facilities within larger geographic area on   Patient informed that his/her managed care company has contracts with or will negotiate with  certain facilities, including the following:     Patient/family informed of bed offers received:  08/20/2013 [facility] Phys[facility] recommends and patient chooses bed at    [facility]  08/23/13 Patient to be transferred to facility by [facility] Patient and family notified of transfer on 08/23/13 Name of family member notified:  Sidney-son  The following physician request were entered in Epic:   Additional  Comments:

## 2013-08-20 NOTE — Progress Notes (Signed)
Physical Therapy Treatment Patient Details Name: Angelica KirksRuby Marshall MRN: [id number] DOB: 08-24-1915 Today's Date: 08/20/2013    History of Present Illness admitted for N/V/D from [facility]    PT Comments    Son Angelica Marshall in room during session.  Pt AxOx2 following commands and ingaging in appropriate conversation.  Assisted pt from supine to EOB + 2 assist and increased time.  MAX c/o dizziness.  BP 157/121  HR 74 and RA 99%.  [facility]isted [facility] twice as pt cont to have loose stools.  Assisted with hygiene twice.  Attempted to amb however unable to MAX c/o dizziness and weakness.  Pt unable to functionally take steps so assisted to recliner.  RN called to room due to pt's BP, active vomiting while sitting on BSC and cont loose stools.   Follow Up Recommendations  SNF     Equipment Recommendations       Recommendations for Other Services       Precautions / Restrictions Precautions Precautions: Fall Precaution Comments: Hx Alzheimers/dementia Restrictions Weight Bearing Restrictions: No    Mobility  Bed Mobility Overal bed mobility: +2 for physical assistance;Needs Assistance Bed Mobility: Supine to Sit     Supine to sit: Max assist;+2 for physical assistance;+2 for safety/equipment     General bed mobility comments: Repeat functional VC's with increased time and use of bed pad to swival hips around to transition pt to EOB.  Max anxiety/fear but pt was able to comprehend task.    Transfers Overall transfer level: Needs assistance Equipment used: None Transfers: Stand Pivot Transfers   Stand pivot transfers: +2 physical assistance;Max assist;+2 safety/equipment       General transfer comment: 1/4 [facility] much effort as pt demon weakness and fear.  Pt able to follow commands and incage in appropriate conversation.    Ambulation/Gait             General Gait Details: attempted however unable to take functional steps due to MAX  c/o dizziness and overall feeling poorly.     Stairs            Wheelchair Mobility    Modified Rankin (Stroke Patients Only)       Balance                                    Cognition                            Exercises      General Comments        Pertinent Vitals/Pain C/o MAX dizziness through session with no relief reported to RN    Home Living                      Prior Function            PT Goals (current goals can now be found in the care plan section) Progress towards PT goals: Progressing toward goals    Frequency  Min 3X/week    PT Plan      Co-evaluation             End of Session Equipment Utilized During Treatment: Gait belt Activity Tolerance: Patient limited by fatigue;Treatment limited secondary to medical complications (Comment) (active vomiting and cont loose stools) Patient left: in chair;with call  bell/phone within reach;with family/visitor present     Time: 1022-1112 PT Time Calculation (min): 50 min  Charges:  $Gait Training: 8-22 mins $Therapeutic Activity: 23-37 mins                    G Codes:      Felecia ShellingLori Kasyn Stouffer  PTA [facility]  Acute  Rehab Pager      [phone]

## 2013-08-20 NOTE — Progress Notes (Signed)
[facility] PROGRESS NOTE  Charleen KirksRuby Wolfinger ZOX:[id number] DOB: 09-Mar-1915 DOA: 08/16/2013 PCP: MAZZOCCHI, Rise MuANNMARIE, MD  Assessment/Plan: 1. Suspected Infectious Gastroenteritis -Patient having multiple episodes of N/V and diarrhea at her facility -Improved, although she had episode of N/V today -Will continue supportive care, empiric antiemetic therapy  2. Dehydration -Resulted from GI illness -Will restart IV fluids since she is nauseous and likely having decreased PO intake  3.  Functional Decline -Patient with history of cognitive impairment -Likely precipitated by GI illness and dehydration -She continues to have weakness despite resolution of GI symptoms and IV fluid resuscitation -Repeat CBC and BMP stable. Awaiting UA results   4. HTN -Tolerating PO -Remains hypertensive, will increase Norvasc to 5 mg PO q daily  5. Deconditioning -Will require skilled nursing care.   Code Status: DNR Family Communication: Spoke with her son at bedside Disposition Plan: Transition to SNF when medically stable   Antibiotics:  Flagyl  HPI/Subjective: Mora BellmanRuby Alla FeelingChampion is an 80 y.o. female with a PMH of Alzheimer's dementia, hypertension, frequent UTIs on chronic Macrobid suppressive therapy who was sent to the ER from her ALF for evaluation of a 24 hour history of nausea, vomiting and diarrhea. She remains confused and disoriented, although seems that diarrhea and N/V have since resolved. History limited given cognitive impairment  She is not at her baseline, continues to be weak, po oral intake  Objective: Filed Vitals:   08/20/13 0610  BP: 158/81  Pulse: 69  Temp: 98.7 F (37.1 C)  Resp: 20    Intake/Output Summary (Last 24 hours) at 08/20/13 1424 Last data filed at 08/20/13 1004  Gross per 24 hour  Intake  687.5 ml  Output      0 ml  Net  687.5 ml   Filed Weights   08/16/13 1529  Weight: 60.1 kg (132 lb 7.9 oz)    Exam:   General:  Less agitated, following  commands, sitting up in bed  Cardiovascular: Regular rate and rhythm, normal S1S2  Respiratory: Clear to auscultation, no wheezing  Abdomen: Soft, nontender, nondistended  Musculoskeletal: No edema  Data Reviewed: Basic Metabolic Panel:  Recent Labs Lab 08/16/13 1132 08/17/13 0515 08/19/13 1141  NA 135* 140 139  K 3.2* 3.5* 3.7  CL 93* 103 104  CO2 18* 22 23  GLUCOSE 224* 106* 129*  BUN 14 14 18   CREATININE 0.70 0.84 0.75  CALCIUM 9.7 8.6 8.1*   Liver Function Tests:  Recent Labs Lab 08/16/13 1132  AST 21  ALT 12  ALKPHOS 102  BILITOT 1.2  PROT 8.4*  ALBUMIN 4.9   No results found for this basename: LIPASE, AMYLASE,  in the last 168 hours No results found for this basename: AMMONIA,  in the last 168 hours CBC:  Recent Labs Lab 08/16/13 1132 08/17/13 0515 08/19/13 1141  WBC 18.6* 12.5* 7.1  NEUTROABS 16.0*  --   --   HGB 15.2* 12.9 11.7*  HCT 43.8 38.7 34.8*  MCV 91.3 93.5 93.5  PLT 261 224 180   Cardiac Enzymes: No results found for this basename: CKTOTAL, CKMB, CKMBINDEX, TROPONINI,  in the last 168 hours BNP (last 3 results) No results found for this basename: PROBNP,  in the last 8760 hours CBG:  Recent Labs Lab 08/16/13 1130 08/16/13 1419  GLUCAP 215* 173*    Recent Results (from the past 240 hour(s))  CLOSTRIDIUM DIFFICILE BY PCR     Status: None   Collection Time    08/19/13  9:58 PM  Result Value Ref Range Status   C difficile by pcr NEGATIVE  NEGATIVE Final   Comment[facility]     Studies: No results found.  Scheduled Meds: . amLODipine  10 mg Oral Daily  . enoxaparin (LOVENOX) injection  40 mg Subcutaneous Q24H  . feeding supplement (ENSURE COMPLETE)  237 mL Oral BID BM  . metoprolol tartrate  12.5 mg Oral BID  . metronidazole  500 mg Intravenous Q8H  . predniSONE  2 mg Oral Q breakfast   Continuous Infusions:    Principal Problem:   Sepsis Active Problems:   HTN (hypertension)    Dehydration   Hypokalemia   Weakness generalized   Nausea and vomiting   Falls frequently   Encephalopathy, metabolic   Alzheimer's disease   Vomiting and diarrhea   Hyperglycemia   Lactic acidosis    Time spent: 25 min    Jeralyn BennettZAMORA, Kolette Vey  [facility] Pager [phone]. If 7PM-7AM, please contact night-coverage at [email or web address], password [email or web address] 08/20/2013, 2:24 PM  LOS: 4 days

## 2013-08-20 NOTE — Progress Notes (Signed)
Clinical Social Work  CSW spoke with patient and son (Sid) at bedside. Son was in room for PT session and reports patient needs SNF at DC. Son has chosen [facility] who is agreeable to accept patient. Son has CSW contact information if needed. CSW will continue to follow and will keep SNF updated on patient's DC plans.  [place]olly Chance Mun[place], [place]CS[place] [phone]

## 2013-08-21 DIAGNOSIS — R1110 Vomiting, unspecified: Secondary | ICD-10-CM

## 2013-08-21 DIAGNOSIS — A0472 Enterocolitis due to Clostridium difficile, not specified as recurrent: Secondary | ICD-10-CM

## 2013-08-21 DIAGNOSIS — R197 Diarrhea, unspecified: Secondary | ICD-10-CM

## 2013-08-21 MED ORDER — DEXAMETHASONE 0.1 % OP SUSP
1.0000 [drp] | Freq: Three times a day (TID) | OPHTHALMIC | Status: DC
  Administered 2013-08-21 – 2013-08-23 (×6): 1 [drp] via OPHTHALMIC
  Filled 2013-08-21: qty 5

## 2013-08-21 MED ORDER — SODIUM CHLORIDE 0.9 % IV SOLN
INTRAVENOUS | Status: DC
  Administered 2013-08-21 – 2013-08-22 (×2): via INTRAVENOUS

## 2013-08-21 MED ORDER — VANCOMYCIN 50 MG/ML ORAL SOLUTION
125.0000 mg | Freq: Four times a day (QID) | ORAL | Status: DC
  Administered 2013-08-21 – 2013-08-22 (×6): 125 mg via ORAL
  Filled 2013-08-21 (×8): qty 2.5

## 2013-08-21 NOTE — Progress Notes (Signed)
Clinical Social Work  Per MD, patient is not medically stable to DC today. CSW spoke with [facility] who reports available bed when patient is medically stable. CSW met with patient and son at bedside who are aware and agreeable to plans.  [place], [place] [phone]

## 2013-08-21 NOTE — Progress Notes (Signed)
[facility] PROGRESS NOTE  Angelica Marshall ZOX:[id number] DOB: 1915-12-05 DOA: 08/16/2013 PCP: MAZZOCCHI, Rise MuANNMARIE, MD  Interim Summary Angelica KirksRuby Goodley is an 80 y.o. female with a PMH of Alzheimer's dementia, hypertension, frequent UTIs on chronic Macrobid suppressive therapy who was sent to the ER from her ALF for evaluation of a 24 hour history of nausea, vomiting and diarrhea, admitted to the medicine service on 08/16/2013. Stool studies were sent as she was started on emperic Flagyl therapy. Over the following days her diarrhea improved however she remained weak. Physical therapy was consulted and recommended SNF. Stool for Cdiff toxin reported on 08/19/2013 as being negative. However, the send out for GI pathogen reported that the stool for Cdiff was positive. Microbiology was called. A repeat stool for Cdiff will be collected. I changed her over to oral vanc given lack of improvement.   Assessment/Plan: 1.  Possible C. diff Colitis -Patient a resident at ALF, presenting with N/V and diarrhea.  -Inconsistencies in lab results. Stool for Cdiff reported to be negative but send out GI pathogen reported a positive result.  - On Oral Vanc. -Repeat stool for Cdiff sent.   2. Dehydration -Resulted from GI illness -Will restart IV fluids since she is nauseous and likely having decreased PO intake  3.  Functional Decline -Patient with history of cognitive impairment -Likely precipitated by GI illness and dehydration -Plan to transition to SNF when medically stable  4. HTN -Tolerating PO -Bllod pressures improving  5. Deconditioning -Will require skilled nursing care.   Code Status: DNR Family Communication: Spoke with her son at bedside Disposition Plan: Transition to SNF when medically stable   Antibiotics:  Flagyl  HPI/Subjective:  She remains weak, having minimal po intake. Had episode of diarrhea overnight.   Objective: Filed Vitals:   08/21/13 1518  BP: 114/69  Pulse: 67   Temp: 98.1 F (36.7 C)  Resp: 18    Intake/Output Summary (Last 24 hours) at 08/21/13 1751 Last data filed at 08/20/13 1800  Gross per 24 hour  Intake     60 ml  Output      0 ml  Net     60 ml   Filed Weights   08/16/13 1529  Weight: 60.1 kg (132 lb 7.9 oz)    Exam:   General:  Ill appearing, follows commands, reports dizziness with sitting up  Cardiovascular: Regular rate and rhythm, normal S1S2  Respiratory: Clear to auscultation, no wheezing  Abdomen: Soft, nontender, nondistended  Musculoskeletal: No edema  Data Reviewed: Basic Metabolic Panel:  Recent Labs Lab 08/16/13 1132 08/17/13 0515 08/19/13 1141  NA 135* 140 139  K 3.2* 3.5* 3.7  CL 93* 103 104  CO2 18* 22 23  GLUCOSE 224* 106* 129*  BUN 14 14 18   CREATININE 0.70 0.84 0.75  CALCIUM 9.7 8.6 8.1*   Liver Function Tests:  Recent Labs Lab 08/16/13 1132  AST 21  ALT 12  ALKPHOS 102  BILITOT 1.2  PROT 8.4*  ALBUMIN 4.9   No results found for this basename: LIPASE, AMYLASE,  in the last 168 hours No results found for this basename: AMMONIA,  in the last 168 hours CBC:  Recent Labs Lab 08/16/13 1132 08/17/13 0515 08/19/13 1141  WBC 18.6* 12.5* 7.1  NEUTROABS 16.0*  --   --   HGB 15.2* 12.9 11.7*  HCT 43.8 38.7 34.8*  MCV 91.3 93.5 93.5  PLT 261 224 180   Cardiac Enzymes: No results found for this basename: CKTOTAL, CKMB,  CKMBINDEX, TROPONINI,  in the last 168 hours BNP (last 3 results) No results found for this basename: PROBNP,  in the last 8760 hours CBG:  Recent Labs Lab 08/16/13 1130 08/16/13 1419  GLUCAP 215* 173*    Recent Results (from the past 240 hour(s))  CLOSTRIDIUM DIFFICILE BY PCR     Status: None   Collection Time    08/19/13  9:58 PM      Result Value Ref Range Status   C difficile by pcr NEGATIVE  NEGATIVE Final   Comment: [facility]     Studies: No results found.  Scheduled Meds: . amLODipine  10 mg Oral Daily  .  dexamethasone  1 drop Both Eyes 3 times per day  . enoxaparin (LOVENOX) injection  40 mg Subcutaneous Q24H  . feeding supplement (ENSURE COMPLETE)  237 mL Oral BID BM  . metoprolol tartrate  12.5 mg Oral BID  . predniSONE  2 mg Oral Q breakfast  . vancomycin  125 mg Oral 4 times per day   Continuous Infusions: . sodium chloride 75 mL/hr at 08/21/13 1342    Principal Problem:   Sepsis Active Problems:   HTN (hypertension)   Dehydration   Hypokalemia   Weakness generalized   Nausea and vomiting   Falls frequently   Encephalopathy, metabolic   Alzheimer's disease   Vomiting and diarrhea   Hyperglycemia   Lactic acidosis    Time spent: 25 min    Jeralyn BennettZAMORA, Etoile Looman  [facility] Pager [phone]. If 7PM-7AM, please contact night-coverage at [email or web address], password [facility] 08/21/2013, 5:51 PM  LOS: 5 days

## 2013-08-22 LAB — CBC
HCT: 39 % (ref 36.0–46.0)
Hemoglobin: 13.1 g/dL (ref 12.0–15.0)
MCH: 30.9 pg (ref 26.0–34.0)
MCHC: 33.6 g/dL (ref 30.0–36.0)
MCV: 92 fL (ref 78.0–100.0)
PLATELETS: 211 10*3/uL (ref 150–400)
RBC: 4.24 MIL/uL (ref 3.87–5.11)
RDW: 14.3 % (ref 11.5–15.5)
WBC: 6.8 10*3/uL (ref 4.0–10.5)

## 2013-08-22 LAB — BASIC METABOLIC PANEL
Anion gap: 10 (ref 5–15)
BUN: 12 mg/dL (ref 6–23)
CO2: 25 mEq/L (ref 19–32)
CREATININE: 0.74 mg/dL (ref 0.50–1.10)
Calcium: 8.8 mg/dL (ref 8.4–10.5)
Chloride: 105 mEq/L (ref 96–112)
GFR calc Af Amer: 81 mL/min — ABNORMAL LOW (ref >90)
GFR, EST NON AFRICAN AMERICAN: 70 mL/min — AB (ref >90)
Glucose, Bld: 115 mg/dL — ABNORMAL HIGH (ref 70–99)
Potassium: 3.4 mEq/L — ABNORMAL LOW (ref 3.7–5.3)
SODIUM: 140 meq/L (ref 137–147)

## 2013-08-22 LAB — CLOSTRIDIUM DIFFICILE BY PCR: Toxigenic C. Difficile by PCR: NEGATIVE

## 2013-08-22 MED ORDER — ALPRAZOLAM 0.25 MG PO TABS: 0.2500 mg | ORAL_TABLET | Freq: Three times a day (TID) | ORAL | Status: DC | PRN

## 2013-08-22 MED ORDER — POTASSIUM CHLORIDE CRYS ER 20 MEQ PO TBCR
40.0000 meq | EXTENDED_RELEASE_TABLET | Freq: Once | ORAL | Status: AC
  Administered 2013-08-22: 40 meq via ORAL
  Filled 2013-08-22: qty 2

## 2013-08-22 MED ORDER — MECLIZINE HCL 12.5 MG PO TABS
12.5000 mg | ORAL_TABLET | Freq: Two times a day (BID) | ORAL | Status: DC | PRN
Start: 2013-08-22 — End: 2013-08-23
  Filled 2013-08-22: qty 1

## 2013-08-22 NOTE — Progress Notes (Signed)
Outside lab re-ran cdiff and was found to be NEGATIVE so all 3 tests are NEGATIVE for c diff- d/c vanc and d/c isolation precautions  Angelica CanaryJessica Casidee Jann dO

## 2013-08-22 NOTE — Progress Notes (Signed)
Physical Therapy Treatment Patient Details Name: Angelica KirksRuby Quale MRN: [id number] DOB: Oct 11, 1915 Today's Date: 08/22/2013    History of Present Illness admitted for N[facility] from [facility]    PT Comments    Improvement in activity tolerance and pt motivation with pt ambulating across room.  Son present and encouraging pt  Follow Up Recommendations  SNF     Equipment Recommendations  None recommended by PT    Recommendations for Other Services OT consult     Precautions / Restrictions Precautions Precautions: Fall Precaution Comments: Hx Alzheimers/dementia Restrictions Weight Bearing Restrictions: No    Mobility  Bed Mobility Overal bed mobility: +2 for physical assistance;Needs Assistance Bed Mobility: Supine to Sit     Supine to sit: Mod assist;+2 for physical assistance     General bed mobility comments: Repeat functional VC's with increased time and use of bed pad to swival hips around to transition pt to EOB.  Transfers Overall transfer level: Needs assistance Equipment used: Rolling walker (2 wheeled) Transfers: Sit to/from Stand Sit to Stand: Min assist;Mod assist;+2 physical assistance         General transfer comment: cues for transition position and use of UEs to self assist  Ambulation/Gait Ambulation/Gait assistance: Min assist;Mod assist;+2 physical assistance;+2 safety/equipment Ambulation Distance (Feet): 8 Feet Assistive device: Rolling walker (2 wheeled) Gait Pattern/deviations: Step-to pattern;Decreased step length - right;Decreased step length - left;Shuffle;Narrow base of support;Trunk flexed Gait velocity: decr   General Gait Details: Cues for posture, position from RW.  Multiple short standing rests required for task completion   Stairs            Wheelchair Mobility    Modified Rankin (Stroke Patients Only)       Balance Overall balance assessment: Needs assistance Sitting-balance support: Bilateral upper  extremity supported Sitting balance-Leahy Scale: Fair Sitting balance - Comments: Pt sat at side of bed x 5 min with BIl UE support and min guard assist for saftey    Standing balance support: Bilateral upper extremity supported Standing balance-Leahy Scale: Poor                      Cognition Arousal/Alertness: Awake/alert Behavior During Therapy: WFL for tasks assessed/performed Overall Cognitive Status: History of cognitive impairments - at baseline       Memory: Decreased short-term memory              Exercises      General Comments        Pertinent Vitals/Pain No specific c/o pain    Home Living                      Prior Function            PT Goals (current goals can now be found in the care plan section) Acute Rehab PT Goals Patient Stated Goal: Not get dizzy PT Goal Formulation: Patient unable to participate in goal setting Time For Goal Achievement: 09/01/13 Potential to Achieve Goals: Fair Progress towards PT goals: Progressing toward goals    Frequency  Min 3X/week    PT Plan Current plan remains appropriate    Co-evaluation             End of Session Equipment Utilized During Treatment: Gait belt Activity Tolerance: Patient tolerated treatment well Patient left: in chair;with call bell/phone within reach;with family/visitor present     Time: 1135-1200 PT Time Calculation (min): 25 min  Charges:  $Gait Training: 8-22  mins $Therapeutic Activity: 8-22 mins                    G Codes:      Capri Raben 08/22/2013, 12:29 PM

## 2013-08-22 NOTE — Progress Notes (Signed)
Angelica Marshall from lab phoned stating outside lab retested stool sample and it was negative for cdiff.  They offered to retest for no charge if needed,  Notified Dr. Benjamine MolaVann

## 2013-08-22 NOTE — Progress Notes (Signed)
[facility] PROGRESS NOTE  Angelica KirksRuby Marshall ZOX:[id number] DOB: August 09, 1915 DOA: 08/16/2013 PCP: MAZZOCCHI, Rise MuANNMARIE, MD  Interim Summary Angelica Marshall is an 80 y.o. female with a PMH of Alzheimer's dementia, hypertension, frequent UTIs on chronic Macrobid suppressive therapy who was sent to the ER from her ALF for evaluation of a 24 hour history of nausea, vomiting and diarrhea, admitted to the medicine service on 08/16/2013. Stool studies were sent as she was started on emperic Flagyl therapy. Over the following days her diarrhea improved however she remained weak. Physical therapy was consulted and recommended SNF. Stool for Cdiff toxin reported on 08/19/2013 as being negative. However, the send out for GI pathogen reported that the stool for Cdiff was positive.  .   Assessment/Plan: C. diff Colitis -Patient a resident at ALF, presenting with N/V and diarrhea.  -Inconsistencies in lab results. Stool for Cdiff reported to be negative but send out GI pathogen reported a positive result.  - On Oral Vanc. Spoke with ID who recs treating  Dehydration -Resulted from GI illness -encourage PO intake   Functional Decline -Patient with history of cognitive impairment -Likely precipitated by GI illness and dehydration -Plan to transition to SNF when medically stable- has bed at [facility]  HTN -Tolerating PO -Bllod pressures improving  Dizziness -resolved PO meclizine   Deconditioning -Will require skilled nursing care.   Hypokalemia -replete   Code Status: DNR Family Communication: Spoke with her son at bedside Disposition Plan: Transition to SNF in AM if tolerating diet   Antibiotics:  Flagyl  HPI/Subjective:  No further dizziness No CP, no SOB Eating very little still   Objective: Filed Vitals:   08/22/13 0700  BP: 158/84  Pulse: 71  Temp: 98.8 F (37.1 C)  Resp: 18    Intake/Output Summary (Last 24 hours) at 08/22/13 1035 Last data filed at 08/22/13 0900  Gross per 24 hour  Intake 1327.5 ml  Output      0 ml  Net 1327.5 ml   Filed Weights   08/16/13 1529  Weight: 60.1 kg (132 lb 7.9 oz)    Exam:   General:  Feeling better, sat up on own  Cardiovascular: Regular rate and rhythm, normal S1S2  Respiratory: Clear to auscultation, no wheezing  Abdomen: Soft, nontender, nondistended  Musculoskeletal: No edema  Data Reviewed: Basic Metabolic Panel:  Recent Labs Lab 08/16/13 1132 08/17/13 0515 08/19/13 1141 08/22/13 0525  NA 135* 140 139 140  K 3.2* 3.5* 3.7 3.4*  CL 93* 103 104 105  CO2 18* 22 23 25   GLUCOSE 224* 106* 129* 115*  BUN 14 14 18 12   CREATININE 0.70 0.84 0.75 0.74  CALCIUM 9.7 8.6 8.1* 8.8   Liver Function Tests:  Recent Labs Lab 08/16/13 1132  AST 21  ALT 12  ALKPHOS 102  BILITOT 1.2  PROT 8.4*  ALBUMIN 4.9   No results found for this basename: LIPASE, AMYLASE,  in the last 168 hours No results found for this basename: AMMONIA,  in the last 168 hours CBC:  Recent Labs Lab 08/16/13 1132 08/17/13 0515 08/19/13 1141 08/22/13 0525  WBC 18.6* 12.5* 7.1 6.8  NEUTROABS 16.0*  --   --   --   HGB 15.2* 12.9 11.7* 13.1  HCT 43.8 38.7 34.8* 39.0  MCV 91.3 93.5 93.5 92.0  PLT 261 224 180 211   Cardiac Enzymes: No results found for this basename: CKTOTAL, CKMB, CKMBINDEX, TROPONINI,  in the last 168 hours BNP (last 3 results) No results found  for this basename: PROBNP,  in the last 8760 hours CBG:  Recent Labs Lab 08/16/13 1130 08/16/13 1419  GLUCAP 215* 173*    Recent Results (from the past 240 hour(s))  CLOSTRIDIUM DIFFICILE BY PCR     Status: None   Collection Time    08/19/13  9:58 PM      Result Value Ref Range Status   C difficile by pcr NEGATIVE  NEGATIVE Final   Comment: [facility]formed [facility]  CLOSTRIDIUM DIFFICILE BY PCR     Status: None   Collection Time    08/21/13  4:53 PM      Result Value Ref Range Status   C difficile by pcr NEGATIVE  NEGATIVE Final    Comment: Performed at [facility]     Studies: No results found.  Scheduled Meds: . amLODipine  10 mg Oral Daily  . dexamethasone  1 drop Both Eyes 3 times per day  . enoxaparin (LOVENOX) injection  40 mg Subcutaneous Q24H  . feeding supplement (ENSURE COMPLETE)  237 mL Oral BID BM  . metoprolol tartrate  12.5 mg Oral BID  . potassium chloride  40 mEq Oral Once  . predniSONE  2 mg Oral Q breakfast  . vancomycin  125 mg Oral 4 times per day   Continuous Infusions:    Principal Problem:   Sepsis Active Problems:   HTN (hypertension)   Dehydration   Hypokalemia   Weakness generalized   Nausea and vomiting   Falls frequently   Encephalopathy, metabolic   Alzheimer's disease   Vomiting and diarrhea   Hyperglycemia   Lactic acidosis    Time spent: 25 min    Pancho Rushing  [facility] Pager [phone] If 7PM-7AM, please contact night-coverage at [email or web address], password [email or web address] 08/22/2013, 10:35 AM  LOS: 6 days

## 2013-08-22 NOTE — Progress Notes (Signed)
Discussed with patient's son that cdiff testing was verified x 3 negative so vancomycin discontinued

## 2013-08-22 NOTE — Progress Notes (Signed)
Physical Therapy Treatment Patient Details Name: Angelica Marshall MRN: [id number] DOB: November 10, 1915 Today's Date: 08/22/2013    History of Present Illness admitted for N/V/D from [facility]    PT Comments      Follow Up Recommendations  SNF     Equipment Recommendations  None recommended by PT    Recommendations for Other Services OT consult     Precautions / Restrictions Precautions Precautions: Fall Precaution Comments: Hx Alzheimers/dementia Restrictions Weight Bearing Restrictions: No    Mobility  Bed Mobility Overal bed mobility: +2 for physical assistance;Needs Assistance Bed Mobility: Sit to Supine     Supine to sit: Mod assist;+2 for physical assistance Sit to supine: Mod assist;+2 for physical assistance   General bed mobility comments: Cues for sequence and use of LEs to assist with lateral shifts in bed  Transfers Overall transfer level: Needs assistance Equipment used: Rolling walker (2 wheeled) Transfers: Sit to/from Stand Sit to Stand: Mod assist;+2 physical assistance Stand pivot transfers: Mod assist;+2 physical assistance;+2 safety/equipment       General transfer comment: cues for transition position and use of UEs to self assist  Ambulation/Gait Ambulation/Gait assistance: Min assist;Mod assist;+2 physical assistance;+2 safety/equipment Ambulation Distance (Feet): 3 Feet Assistive device: Rolling walker (2 wheeled) Gait Pattern/deviations: Step-to pattern;Decreased step length - right;Decreased step length - left;Shuffle;Trunk flexed Gait velocity: decr   General Gait Details: Cues for posture, position from RW and sequence,  Pt transferred stand[facility]pvt [facility] stood from Angelica Marshall and backed up to bed`   Stairs            Wheelchair Mobility    Modified Rankin (Stroke Patients Only)       Balance Overall balance assessment: Needs assistance Sitting-balance support: Bilateral upper extremity supported Sitting  balance-Leahy Scale: Fair Sitting balance - Comments: Pt sat at side of bed x 5 min with BIl UE support and min guard assist for saftey    Standing balance support: Bilateral upper extremity supported Standing balance-Leahy Scale: Poor                      Cognition Arousal/Alertness: Awake/alert Behavior During Therapy: WFL for tasks assessed/performed Overall Cognitive Status: History of cognitive impairments - at baseline       Memory: Decreased short-term memory              Exercises      General Comments        Pertinent Vitals/Pain No specific c/o pain    Home Living                      Prior Function            PT Goals (current goals can now be found in Angelica care plan section) Acute Rehab PT Goals Patient Stated Goal: Get back to bed PT Goal Formulation: Patient unable to participate in goal setting Time For Goal Achievement: 09/01/13 Potential to Achieve Goals: Fair Progress towards PT goals: Progressing toward goals    Frequency  Min 3X/week    PT Plan Current plan remains appropriate    Co-evaluation             End of Session Equipment Utilized During Treatment: Gait belt Activity Tolerance: Patient tolerated treatment well;Patient limited by fatigue Patient left: in bed;with call bell/phone within reach     Time: 1446-1505 PT Time Calculation (min): 19 min  Charges:  $Gait Training: 8-22 mins $Therapeutic Activity:  8-22 mins                    G Codes:      Angelica Marshall 08/22/2013, 3:27 PM

## 2013-08-22 NOTE — Progress Notes (Signed)
Clinical Social Work  Per MD, patient is not medically stable to DC. CSW updated [facility] who remains agreeable to accept at DC. CSW will continue to follow.  HarrisonHolly Rhyan Wolters, [place] [phone]

## 2013-08-23 ENCOUNTER — Encounter (HOSPITAL_COMMUNITY): Payer: Self-pay | Admitting: Emergency Medicine

## 2013-08-23 ENCOUNTER — Inpatient Hospital Stay (HOSPITAL_COMMUNITY): Payer: Medicare Other

## 2013-08-23 ENCOUNTER — Emergency Department (HOSPITAL_COMMUNITY): Payer: Medicare Other

## 2013-08-23 ENCOUNTER — Inpatient Hospital Stay (HOSPITAL_COMMUNITY)
Admission: EM | Admit: 2013-08-23 | Discharge: 2013-08-27 | Disposition: A | Discharge status: Skilled Nursing Facility | Payer: Medicare Other | Source: Home / Self Care | Attending: Internal Medicine | Admitting: Internal Medicine

## 2013-08-23 DIAGNOSIS — F0280 Dementia in other diseases classified elsewhere without behavioral disturbance: Secondary | ICD-10-CM

## 2013-08-23 DIAGNOSIS — I10 Essential (primary) hypertension: Secondary | ICD-10-CM

## 2013-08-23 DIAGNOSIS — S22080A Wedge compression fracture of T11-T12 vertebra, initial encounter for closed fracture: Secondary | ICD-10-CM

## 2013-08-23 DIAGNOSIS — K529 Noninfective gastroenteritis and colitis, unspecified: Secondary | ICD-10-CM | POA: Diagnosis present

## 2013-08-23 DIAGNOSIS — G309 Alzheimer's disease, unspecified: Secondary | ICD-10-CM

## 2013-08-23 DIAGNOSIS — R1110 Vomiting, unspecified: Secondary | ICD-10-CM

## 2013-08-23 DIAGNOSIS — E860 Dehydration: Secondary | ICD-10-CM

## 2013-08-23 DIAGNOSIS — R531 Weakness: Secondary | ICD-10-CM

## 2013-08-23 DIAGNOSIS — R1115 Cyclical vomiting syndrome unrelated to migraine: Secondary | ICD-10-CM

## 2013-08-23 DIAGNOSIS — R112 Nausea with vomiting, unspecified: Secondary | ICD-10-CM | POA: Diagnosis present

## 2013-08-23 LAB — CBC WITH DIFFERENTIAL/PLATELET
BASOS ABS: 0 10*3/uL (ref 0.0–0.1)
Basophils Relative: 0 % (ref 0–1)
EOS PCT: 1 % (ref 0–5)
Eosinophils Absolute: 0.1 10*3/uL (ref 0.0–0.7)
HEMATOCRIT: 40.4 % (ref 36.0–46.0)
HEMOGLOBIN: 13.6 g/dL (ref 12.0–15.0)
Lymphocytes Relative: 12 % (ref 12–46)
Lymphs Abs: 0.8 10*3/uL (ref 0.7–4.0)
MCH: 31.3 pg (ref 26.0–34.0)
MCHC: 33.7 g/dL (ref 30.0–36.0)
MCV: 92.9 fL (ref 78.0–100.0)
MONO ABS: 0.7 10*3/uL (ref 0.1–1.0)
MONOS PCT: 9 % (ref 3–12)
NEUTROS ABS: 5.5 10*3/uL (ref 1.7–7.7)
Neutrophils Relative %: 78 % — ABNORMAL HIGH (ref 43–77)
Platelets: 240 10*3/uL (ref 150–400)
RBC: 4.35 MIL/uL (ref 3.87–5.11)
RDW: 14.3 % (ref 11.5–15.5)
WBC: 7.1 10*3/uL (ref 4.0–10.5)

## 2013-08-23 LAB — URINALYSIS, ROUTINE W REFLEX MICROSCOPIC
Bilirubin Urine: NEGATIVE
GLUCOSE, UA: NEGATIVE mg/dL
Hgb urine dipstick: NEGATIVE
KETONES UR: NEGATIVE mg/dL
LEUKOCYTES UA: NEGATIVE
Nitrite: NEGATIVE
Protein, ur: NEGATIVE mg/dL
Specific Gravity, Urine: 1.009 (ref 1.005–1.030)
Urobilinogen, UA: 0.2 mg/dL (ref 0.0–1.0)
pH: 7.5 (ref 5.0–8.0)

## 2013-08-23 LAB — LIPASE, BLOOD: Lipase: 40 U/L (ref 11–59)

## 2013-08-23 LAB — COMPREHENSIVE METABOLIC PANEL
ALBUMIN: 3.2 g/dL — AB (ref 3.5–5.2)
ALT: 7 U/L (ref 0–35)
AST: 16 U/L (ref 0–37)
Alkaline Phosphatase: 59 U/L (ref 39–117)
Anion gap: 16 — ABNORMAL HIGH (ref 5–15)
BILIRUBIN TOTAL: 0.5 mg/dL (ref 0.3–1.2)
BUN: 10 mg/dL (ref 6–23)
CHLORIDE: 102 meq/L (ref 96–112)
CO2: 22 meq/L (ref 19–32)
Calcium: 8.9 mg/dL (ref 8.4–10.5)
Creatinine, Ser: 0.63 mg/dL (ref 0.50–1.10)
GFR calc Af Amer: 85 mL/min — ABNORMAL LOW (ref >90)
GFR, EST NON AFRICAN AMERICAN: 73 mL/min — AB (ref >90)
Glucose, Bld: 131 mg/dL — ABNORMAL HIGH (ref 70–99)
Potassium: 4.2 mEq/L (ref 3.7–5.3)
Sodium: 140 mEq/L (ref 137–147)
Total Protein: 6.5 g/dL (ref 6.0–8.3)

## 2013-08-23 LAB — CBC
HEMATOCRIT: 38.9 % (ref 36.0–46.0)
Hemoglobin: 13.1 g/dL (ref 12.0–15.0)
MCH: 31.6 pg (ref 26.0–34.0)
MCHC: 33.7 g/dL (ref 30.0–36.0)
MCV: 93.7 fL (ref 78.0–100.0)
Platelets: 190 10*3/uL (ref 150–400)
RBC: 4.15 MIL/uL (ref 3.87–5.11)
RDW: 14.6 % (ref 11.5–15.5)
WBC: 7.4 10*3/uL (ref 4.0–10.5)

## 2013-08-23 LAB — CREATININE, SERUM
Creatinine, Ser: 0.65 mg/dL (ref 0.50–1.10)
GFR calc Af Amer: 84 mL/min — ABNORMAL LOW (ref >90)
GFR calc non Af Amer: 73 mL/min — ABNORMAL LOW (ref >90)

## 2013-08-23 LAB — I-STAT CG4 LACTIC ACID, ED: LACTIC ACID, VENOUS: 0.84 mmol/L (ref 0.5–2.2)

## 2013-08-23 LAB — CK: Total CK: 104 U/L (ref 7–177)

## 2013-08-23 LAB — TROPONIN I: Troponin I: <0.3 ng/mL (ref <0.30)

## 2013-08-23 LAB — MRSA PCR SCREENING: MRSA by PCR: NEGATIVE

## 2013-08-23 MED ORDER — ENSURE PLUS PO LIQD: 237.0000 mL | Freq: Two times a day (BID) | ORAL | Status: DC

## 2013-08-23 MED ORDER — ALPRAZOLAM 0.25 MG PO TABS: 0.2500 mg | ORAL_TABLET | Freq: Three times a day (TID) | ORAL | Status: DC | PRN

## 2013-08-23 MED ORDER — HYDRALAZINE HCL 20 MG/ML IJ SOLN: 10.0000 mg | INTRAMUSCULAR | Status: DC | PRN

## 2013-08-23 MED ORDER — CALCIUM CARBONATE-VITAMIN D 500-200 MG-UNIT PO TABS
1.0000 | ORAL_TABLET | Freq: Two times a day (BID) | ORAL | Status: DC
  Administered 2013-08-24 – 2013-08-27 (×6): 1 via ORAL
  Filled 2013-08-23 (×9): qty 1

## 2013-08-23 MED ORDER — SODIUM CHLORIDE 0.9 % IV SOLN
INTRAVENOUS | Status: AC
  Administered 2013-08-23 – 2013-08-24 (×2): via INTRAVENOUS

## 2013-08-23 MED ORDER — ACETAMINOPHEN 325 MG PO TABS: 650.0000 mg | ORAL_TABLET | Freq: Four times a day (QID) | ORAL | Status: DC | PRN

## 2013-08-23 MED ORDER — PANTOPRAZOLE SODIUM 40 MG PO TBEC
40.0000 mg | DELAYED_RELEASE_TABLET | Freq: Every day | ORAL | Status: DC
  Administered 2013-08-24 – 2013-08-27 (×4): 40 mg via ORAL
  Filled 2013-08-23 (×4): qty 1

## 2013-08-23 MED ORDER — MELATONIN 3 MG PO TABS: 1.0000 | ORAL_TABLET | Freq: Every day | ORAL | Status: DC

## 2013-08-23 MED ORDER — METRONIDAZOLE IN NACL 5-0.79 MG/ML-% IV SOLN
500.0000 mg | Freq: Three times a day (TID) | INTRAVENOUS | Status: DC
  Administered 2013-08-23 – 2013-08-24 (×3): 500 mg via INTRAVENOUS
  Filled 2013-08-23 (×5): qty 100

## 2013-08-23 MED ORDER — ENSURE COMPLETE PO LIQD
237.0000 mL | Freq: Two times a day (BID) | ORAL | Status: DC
  Administered 2013-08-24 – 2013-08-27 (×6): 237 mL via ORAL

## 2013-08-23 MED ORDER — MECLIZINE HCL 12.5 MG PO TABS
12.5000 mg | ORAL_TABLET | Freq: Three times a day (TID) | ORAL | Status: DC | PRN
  Administered 2013-08-23 – 2013-08-24 (×2): 12.5 mg via ORAL
  Filled 2013-08-23 (×3): qty 1

## 2013-08-23 MED ORDER — GABAPENTIN 100 MG PO CAPS
100.0000 mg | ORAL_CAPSULE | Freq: Three times a day (TID) | ORAL | Status: DC
  Administered 2013-08-24 – 2013-08-27 (×9): 100 mg via ORAL
  Filled 2013-08-23 (×13): qty 1

## 2013-08-23 MED ORDER — ONDANSETRON HCL 4 MG/2ML IJ SOLN
4.0000 mg | Freq: Four times a day (QID) | INTRAMUSCULAR | Status: DC | PRN
  Administered 2013-08-23: 4 mg via INTRAVENOUS
  Filled 2013-08-23: qty 2

## 2013-08-23 MED ORDER — TRAMADOL HCL 50 MG PO TABS: 50.0000 mg | ORAL_TABLET | Freq: Three times a day (TID) | ORAL | Status: AC

## 2013-08-23 MED ORDER — PREDNISONE 1 MG PO TABS
2.0000 mg | ORAL_TABLET | Freq: Every day | ORAL | Status: DC
  Administered 2013-08-24 – 2013-08-27 (×4): 2 mg via ORAL
  Filled 2013-08-23 (×5): qty 2

## 2013-08-23 MED ORDER — ATORVASTATIN CALCIUM 20 MG PO TABS
20.0000 mg | ORAL_TABLET | Freq: Every day | ORAL | Status: DC
  Administered 2013-08-24 – 2013-08-27 (×4): 20 mg via ORAL
  Filled 2013-08-23 (×4): qty 1

## 2013-08-23 MED ORDER — LORATADINE 10 MG PO TABS
10.0000 mg | ORAL_TABLET | Freq: Every day | ORAL | Status: DC
  Administered 2013-08-24 – 2013-08-27 (×4): 10 mg via ORAL
  Filled 2013-08-23 (×4): qty 1

## 2013-08-23 MED ORDER — SERTRALINE HCL 25 MG PO TABS
25.0000 mg | ORAL_TABLET | Freq: Every day | ORAL | Status: DC
  Administered 2013-08-24 – 2013-08-27 (×4): 25 mg via ORAL
  Filled 2013-08-23 (×4): qty 1

## 2013-08-23 MED ORDER — ALPRAZOLAM 0.25 MG PO TABS
0.2500 mg | ORAL_TABLET | Freq: Three times a day (TID) | ORAL | Status: DC | PRN
  Filled 2013-08-23: qty 1

## 2013-08-23 MED ORDER — SODIUM CHLORIDE 0.9 % IV BOLUS (SEPSIS)
500.0000 mL | Freq: Once | INTRAVENOUS | Status: AC
  Administered 2013-08-23: 500 mL via INTRAVENOUS

## 2013-08-23 MED ORDER — TRAMADOL HCL 50 MG PO TABS
50.0000 mg | ORAL_TABLET | Freq: Three times a day (TID) | ORAL | Status: DC
  Administered 2013-08-23 – 2013-08-26 (×9): 50 mg via ORAL
  Filled 2013-08-23 (×9): qty 1

## 2013-08-23 MED ORDER — ONDANSETRON HCL 4 MG PO TABS: 4.0000 mg | ORAL_TABLET | Freq: Four times a day (QID) | ORAL | Status: DC | PRN

## 2013-08-23 MED ORDER — ENOXAPARIN SODIUM 30 MG/0.3ML ~~LOC~~ SOLN
30.0000 mg | SUBCUTANEOUS | Status: DC
  Administered 2013-08-24 – 2013-08-26 (×3): 30 mg via SUBCUTANEOUS
  Filled 2013-08-23 (×5): qty 0.3

## 2013-08-23 MED ORDER — METOPROLOL TARTRATE 12.5 MG HALF TABLET
12.5000 mg | ORAL_TABLET | Freq: Two times a day (BID) | ORAL | Status: DC
  Administered 2013-08-23 – 2013-08-25 (×4): 12.5 mg via ORAL
  Filled 2013-08-23 (×5): qty 1

## 2013-08-23 MED ORDER — ONDANSETRON HCL 4 MG/2ML IJ SOLN
4.0000 mg | Freq: Once | INTRAMUSCULAR | Status: AC
  Administered 2013-08-23: 4 mg via INTRAVENOUS
  Filled 2013-08-23: qty 2

## 2013-08-23 MED ORDER — NITROFURANTOIN MONOHYD MACRO 100 MG PO CAPS
100.0000 mg | ORAL_CAPSULE | Freq: Two times a day (BID) | ORAL | Status: DC
  Filled 2013-08-23 (×3): qty 1

## 2013-08-23 MED ORDER — ACETAMINOPHEN 650 MG RE SUPP: 650.0000 mg | Freq: Four times a day (QID) | RECTAL | Status: DC | PRN

## 2013-08-23 NOTE — ED Notes (Signed)
Patient transported to MRI 

## 2013-08-23 NOTE — ED Provider Notes (Signed)
CSN: [id number]     Arrival date & time 08/23/13  1537 History   First MD Initiated Contact with Patient 08/23/13 1540     Chief Complaint  Patient presents with  . Dizziness     (Consider location/radiation/quality/duration/timing/severity/associated sxs/prior Treatment) The history is provided by a relative, medical records and the EMS personnel.    Angelica Marshall is a(n) 80 y.o. female who presents emergency department via EMS for nausea, vomiting and symptoms of vertigo. The patient was admitted to [facility] as 08-2013 for the same symptoms. The patient was found to have an elevated lactate and elevated white count making her SIRS positive. No actual source was found. The patient was found to have an equivocal C. Diff and was given vanc, later C. Diff was found to be negative. She received Flagyl and fluid and her labs appeared to be normalizing. She is attended by her son today who states that yesterday she appear to be turning the corner, she ate for the first time in several days, which is holding down foods and fluids. The patient was discharged this morning from [facility] and return to her assisted living facility. According to EMS reports the patient was already actively and nauseous and vomiting at the hospital however was discharged. When she arrived at her assisted living facility she was complaining of the room spinning around her, nausea and vomiting. She was returned to: Emergency department. According to her son that she does not have a history of hypertension however when she was seen at the hospital on Thursday, July 2 she was found to have highly elevated blood pressure. Her symptoms began the night before Wednesday 71 acutely. She has no previous history of vertigo. She does have a history of dementia and is unable to cooperate with exam commands.  Past Medical History  Diagnosis Date  . Hypertension   . Arthritis   . Depression   . Allergic rhinitis due to other allergen    . Other and unspecified hyperlipidemia   . Dementia, unspecified, without behavioral disturbance   . Alzheimer's disease   . Unspecified arthropathy, shoulder region   . Anorexia   . Undiagnosed cardiac murmurs   . GERD (gastroesophageal reflux disease)   . Chronic constipation    Past Surgical History  Procedure Laterality Date  . Back surgery    . Tonsillectomy    . Abdominal hysterectomy     Family History  Problem Relation Age of Onset  . Diabetes Brother   . Alcoholism Father   . Crohn's disease Son     Died at age 24 from GI complications   History  Substance Use Topics  . Smoking status: Former Games developer  . Smokeless tobacco: Never Used  . Alcohol Use: No   OB History   Grav Para Term Preterm Abortions TAB SAB Ect Mult Living                 Review of Systems  Ten systems reviewed and are negative for acute change, except as noted in the HPI.    Allergies  Ciprofloxacin  Home Medications   Prior to Admission medications   Medication Sig Start Date End Date Taking? Authorizing Provider  acetaminophen (TYLENOL) 500 MG tablet Take 500 mg by mouth 3 (three) times daily.    Historical Provider, MD  ALPRAZolam Prudy Feeler) 0.25 MG tablet Take 1 tablet (0.25 mg total) by mouth 3 (three) times daily as needed for anxiety or sleep (take 2 times daily  as needed but scheduled at bedtime). 08/23/13   Nishant Dhungel, MD  atorvastatin (LIPITOR) 20 MG tablet Take 20 mg by mouth daily.      Historical Provider, MD  calcium-vitamin D (OSCAL WITH D) 500-200 MG-UNIT per tablet Take 1 tablet by mouth 2 (two) times daily.    Historical Provider, MD  CRANBERRY EXTRACT PO Take 475 mg by mouth 2 (two) times daily.     Historical Provider, MD  Ensure Plus (ENSURE PLUS) LIQD Take 237 mLs by mouth 2 (two) times daily.    Historical Provider, MD  esomeprazole (NEXIUM) 40 MG capsule Take 40 mg by mouth daily before breakfast.      Historical Provider, MD  gabapentin (NEURONTIN) 100 MG capsule  Take 100 mg by mouth 3 (three) times daily.    Historical Provider, MD  loperamide (IMODIUM) 2 MG capsule Take 2 mg by mouth 4 (four) times daily as needed for diarrhea or loose stools.    Historical Provider, MD  loratadine (CLARITIN) 10 MG tablet Take 10 mg by mouth daily.    Historical Provider, MD  Melatonin 3 MG TABS Take 1 tablet by mouth at bedtime.    Historical Provider, MD  metoprolol tartrate (LOPRESSOR) 25 MG tablet Take 12.5 mg by mouth 2 (two) times daily.    Historical Provider, MD  nitrofurantoin, macrocrystal-monohydrate, (MACROBID) 100 MG capsule Take 100 mg by mouth daily.    Historical Provider, MD  ondansetron (ZOFRAN) 4 MG tablet Take 4 mg by mouth every 6 (six) hours as needed for nausea.    Historical Provider, MD  predniSONE (DELTASONE) 1 MG tablet Take 2 mg by mouth daily with breakfast.    Historical Provider, MD  senna (SENOKOT) 8.6 MG TABS Take 1 tablet by mouth 2 (two) times daily.    Historical Provider, MD  sertraline (ZOLOFT) 50 MG tablet Take 25 mg by mouth daily.     Historical Provider, MD  simethicone (MYLICON) 80 MG chewable tablet Chew 80 mg by mouth every 6 (six) hours as needed for flatulence.     Historical Provider, MD  traMADol (ULTRAM) 50 MG tablet Take 1 tablet (50 mg total) by mouth 3 (three) times daily. 08/23/13   Nishant Dhungel, MD   BP 140/67  Temp(Src) 98.7 F (37.1 C) (Oral)  Resp 15  SpO2 98% Physical Exam  Nursing note and vitals reviewed. Constitutional: No distress.  Frail, elderly female in NAD.  HENT:  Head: Normocephalic and atraumatic.  Dry oral mucosa  Eyes: Pupils are equal, round, and reactive to light.  Patient with erythematous lid margins. Stye present on left upper lid. Unable to follow commands to test EOMI  Neck: Normal range of motion. JVD present.  Cardiovascular: Normal rate and intact distal pulses.   3/6 systolic murmur  Pulmonary/Chest: Effort normal and breath sounds normal. She has no wheezes.  Abdominal:  Soft. Bowel sounds are normal. She exhibits no distension. There is no tenderness.  Neurological: She is alert.  Oriented to person and place    ED Course  Procedures (including critical care time) Labs Review Labs Reviewed - No data to display  Imaging Review No results found.   EKG Interpretation None     CBC    Component Value Date/Time   WBC 7.4 08/23/2013 0534   RBC 4.15 08/23/2013 0534   HGB 13.1 08/23/2013 0534   HCT 38.9 08/23/2013 0534   PLT 190 08/23/2013 0534   MCV 93.7 08/23/2013 0534   MCH 31.6 08/23/2013  0534   MCHC 33.7 08/23/2013 0534   RDW 14.6 08/23/2013 0534   LYMPHSABS 1.2 08/16/2013 1132   MONOABS 1.4* 08/16/2013 1132   EOSABS 0.0 08/16/2013 1132   BASOSABS 0.0 08/16/2013 1132   CMP     Component Value Date/Time   NA 140 08/22/2013 0525   K 3.4* 08/22/2013 0525   CL 105 08/22/2013 0525   CO2 25 08/22/2013 0525   GLUCOSE 115* 08/22/2013 0525   BUN 12 08/22/2013 0525   CREATININE 0.65 08/23/2013 0534   CALCIUM 8.8 08/22/2013 0525   PROT 8.4* 08/16/2013 1132   ALBUMIN 4.9 08/16/2013 1132   AST 21 08/16/2013 1132   ALT 12 08/16/2013 1132   ALKPHOS 102 08/16/2013 1132   BILITOT 1.2 08/16/2013 1132   GFRNONAA 73* 08/23/2013 0534   GFRAA 84* 08/23/2013 0534   Urinalysis    Component Value Date/Time   COLORURINE YELLOW 08/20/2013 1728   APPEARANCEUR CLEAR 08/20/2013 1728   LABSPEC 1.009 08/20/2013 1728   PHURINE 6.0 08/20/2013 1728   GLUCOSEU NEGATIVE 08/20/2013 1728   HGBUR NEGATIVE 08/20/2013 1728   BILIRUBINUR NEGATIVE 08/20/2013 1728   KETONESUR NEGATIVE 08/20/2013 1728   PROTEINUR NEGATIVE 08/20/2013 1728   UROBILINOGEN 0.2 08/20/2013 1728   NITRITE NEGATIVE 08/20/2013 1728   LEUKOCYTESUR NEGATIVE 08/20/2013 1728         MDM   Final diagnoses:  Intractable vomiting with nausea, vomiting of unspecified type    4:42 PM BP 140/67  Temp(Src) 98.7 F (37.1 C) (Oral)  Resp 15  SpO2 98% Patient with mild htn. Dry oral mucosa. She appears very weak. Unable to test cerebellar fx at this time.      5:46 PM BP 139/64  Pulse 80  Temp(Src) 99.1 F (37.3 C) (Rectal)  Resp 18  SpO2 100% Patient c/o neck pain to the nurse. When I evaluated her she stated that she was not sure if she was having neck pain. No pain to palpation or movement. No bruits noted.  Patient seems more confused. She repeats the questions I ask instead of answering. Her son states that she is not at her baseline mental status.   Patient MRI negative. Will admit for nausea and vomiting. Dr. Toniann Fail will admit. I personally reviewed the imaging tests through PACS system. I have reviewed and interpreted Lab values. I reviewed available ER/hospitalization records through the EMR    The patient appears reasonably stabilized for admission considering the current resources, flow, and capabilities available in the ED at this time, and I doubt any other [facility] requiring further screening and/or treatment in the ED prior to admission.   Arthor Captain, PA-C 08/24/13 1036

## 2013-08-23 NOTE — ED Notes (Signed)
Returned from MRI 

## 2013-08-23 NOTE — Discharge Summary (Signed)
Physician Discharge Summary  Angelica Marshall WGN:[id number] DOB: 09/19/1915 DOA: 08/16/2013  PCP: Angelica Coventry, MD  Admit date: 08/16/2013 Discharge date: 08/23/2013  Time spent: 35 minutes  Recommendations for Outpatient Follow-up:   #1. Discharge to skilled nursing facility ( [name]) #2. Follow BMET in 1 week #3. Please address patient's  low dose prednsone  Discharge Diagnoses:  Principal Problem:   Sepsis  Active Problems:   Vomiting and diarrhea   HTN (hypertension)   Dehydration   Hypokalemia   Weakness generalized   Nausea and vomiting   Falls frequently   Encephalopathy, metabolic   Alzheimer's disease   Hyperglycemia   Lactic acidosis   Discharge Condition: fair  Diet recommendation: Regular with supplements  CODE STATUS: DO NOT RESUSCITATE  Family communication Spoke with son Angelica Marshall over the phone on  the day of discharge    Filed Weights   08/16/13 1529  Weight: 60.1 kg (132 lb 7.9 oz)    History of present illness:  Please refer to admission H&P for details, but in brief, 80 year old female with a alzeimer's dementia, hypertension, frequent UTIs on chronic Macrobid for suppressive therapy, failure to thrive and depression and was sent from assisted living facility for one day history of nausea with vomiting and diarrhea. She was admitted to hospitalist service on 08/16/2013. She was started on empiric Flagyl and stool studies were sent. Patient however continued to remain weak despite her diarrhea resolved and hydration improved. Physical therapy was consulted who recommended skilled nursing facility.  Hospital Course:   Sepsis with metabolic neuropathy Likely secondary to acute gastroenteritis. Patient presented with nausea, vomiting, diarrhea with lactic acidosis and leukocytosis along with encephalopathy. Now resolved. Further course as outlined below.  Acute gastroenteritis, possibly viral Patient presented with acute onset nausea vomiting  or diarrhea. Patient initially placed on empiric Flagyl and then switched to oral vancomycin given inconsistent lab results of positive C. difficile. On contacting lab again on 7/7 it was notified that she stool specimen for C. difficile was negative and thus antibiotic was discontinued. Patient appears well hydrated today and her nausea and vomiting has resolved. She is eating small amount of meal. She needs to be answered is to increase her by mouth intake and continue with supplements.   Dehydration Secondary to gastroenteritis. No results. Increased by mouth intake.  Failure to thrive with functional decline Symptoms worsened with acute gastroenteritis and dehydration. Has underlying dementia. Needs continuous PT and skilled nursing facility  Hypokalemia replenished  Hypertension Blood pressure stable. Continue home dose metoprolol and losartan  Dizziness Resolved with when necessary meclizine.  Anxiety and depression Continue when necessary Xanax and Zoloft  GERD Continue PPI  Recurrent UTI On macroBid for chronic suppression  Patient is on chronic low-dose prednisone for? Appetite stimulation. Needs to be addressed as outpatient. As A1c of 5.8     Procedures:  None  Consultations:  None  Discharge Exam: Filed Vitals:   08/23/13 0653  BP: 126/74  Pulse: 69  Temp: 99.1 F (37.3 C)  Resp: 18    General: Elderly thin built female in no acute distress HEENT: No pallor, moist oral mucosa Chest: Clear to auscultation bilaterally, no added sounds CVS: S1 and S2 irregular, 3-sixth systolic murmur Abdomen: Soft, nontender, nondistended, bowel sounds present Extremities: Warm, no edema CNS: Alert and awake    Discharge Instructions You were cared for by a hospitalist during your hospital stay. If you have any questions about your discharge medications or the care you received while  you were in the hospital after you are discharged, you can call the unit and  asked to speak with the hospitalist on call if the hospitalist that took care of you is not available. Once you are discharged, your primary care physician will handle any further medical issues. Please note that NO REFILLS for any discharge medications will be authorized once you are discharged, as it is imperative that you return to your primary care physician (or establish a relationship with a primary care physician if you do not have one) for your aftercare needs so that they can reassess your need for medications and monitor your lab values.     Medication List         acetaminophen 500 MG tablet  Commonly known as:  TYLENOL  Take 500 mg by mouth 3 (three) times daily.     ALPRAZolam 0.25 MG tablet  Commonly known as:  XANAX  Take 0.25 mg by mouth 3 (three) times daily as needed for anxiety or sleep (take 2 times daily as needed but scheduled at bedtime).     atorvastatin 20 MG tablet  Commonly known as:  LIPITOR  Take 20 mg by mouth daily.     calcium-vitamin D 500-200 MG-UNIT per tablet  Commonly known as:  OSCAL WITH D  Take 1 tablet by mouth 2 (two) times daily.     CRANBERRY EXTRACT PO  Take 475 mg by mouth 2 (two) times daily.     ENSURE PLUS Liqd  Take 237 mLs by mouth 2 (two) times daily.     esomeprazole 40 MG capsule  Commonly known as:  NEXIUM  Take 40 mg by mouth daily before breakfast.     gabapentin 100 MG capsule  Commonly known as:  NEURONTIN  Take 100 mg by mouth 3 (three) times daily.     loperamide 2 MG capsule  Commonly known as:  IMODIUM  Take 2 mg by mouth 4 (four) times daily as needed for diarrhea or loose stools.     loratadine 10 MG tablet  Commonly known as:  CLARITIN  Take 10 mg by mouth daily.     Melatonin 3 MG Tabs  Take 1 tablet by mouth at bedtime.     metoprolol tartrate 25 MG tablet  Commonly known as:  LOPRESSOR  Take 12.5 mg by mouth 2 (two) times daily.     nitrofurantoin (macrocrystal-monohydrate) 100 MG capsule   Commonly known as:  MACROBID  Take 100 mg by mouth daily.     ondansetron 4 MG tablet  Commonly known as:  ZOFRAN  Take 4 mg by mouth every 6 (six) hours as needed for nausea.     predniSONE 1 MG tablet  Commonly known as:  DELTASONE  Take 2 mg by mouth daily with breakfast.     senna 8.6 MG Tabs tablet  Commonly known as:  SENOKOT  Take 1 tablet by mouth 2 (two) times daily.     sertraline 50 MG tablet  Commonly known as:  ZOLOFT  Take 25 mg by mouth daily.     simethicone 80 MG chewable tablet  Commonly known as:  MYLICON  Chew 80 mg by mouth every 6 (six) hours as needed for flatulence.     traMADol 50 MG tablet  Commonly known as:  ULTRAM  Take 50 mg by mouth 3 (three) times daily.       Allergies  Allergen Reactions  . Ciprofloxacin     Unknown  Follow-up Information   Follow up with MAZZOCCHI, Rise MuANNMARIE, MD In 1 week. (after discharged from SNF)    Specialty:  Family Medicine   Contact information:   [address] [place] [address] [phone]       Please follow up. ([facility]        The results of significant diagnostics from this hospitalization (including imaging, microbiology, ancillary and laboratory) are listed below for reference.    Significant Diagnostic Studies: Dg Chest 2 View  08/16/2013   CLINICAL DATA:  htn  EXAM: CHEST  2 VIEW  COMPARISON:  Two-view chest 06/11/2012  FINDINGS: Low lung volumes. Cardiac silhouette stable. Tortuous ectatic aorta containing atherosclerotic calcifications. Mild prominence of interstitial markings. The lungs are otherwise clear. Degenerative changes within the shoulders.  IMPRESSION: Chronic bronchitic changes without acute cardiopulmonary disease.   Electronically Signed   By: Salome HolmesHector  Cooper M.D.   On: 08/16/2013 13:22   Ct Head Wo Contrast  08/16/2013   CLINICAL DATA:  Nausea, vomiting, status post fall 2 weeks ago.  EXAM: CT HEAD WITHOUT CONTRAST  TECHNIQUE: Contiguous axial  images were obtained from the base of the skull through the vertex without intravenous contrast.  COMPARISON:  June 11, 2012  FINDINGS: There is chronic diffuse atrophy. Chronic bilateral periventricular white matter small vessel ischemic changes identified. There is no midline shift, hydrocephalus, or mass. No acute hemorrhage or acute transcortical infarct is identified. The bony calvarium is intact. The visualized sinuses are clear.  IMPRESSION: No focal acute intracranial abnormality identified. Chronic diffuse atrophy. Chronic bilateral periventricular white matter small vessel ischemic change.   Electronically Signed   By: Sherian ReinWei-Chen  Lin M.D.   On: 08/16/2013 13:20   Dg Abd 2 Views  08/17/2013   CLINICAL DATA:  Nausea, vomiting, diarrhea  EXAM: ABDOMEN - 2 VIEW  COMPARISON:  None.  FINDINGS: The bowel gas pattern is normal. There is no evidence of free air. The pattern of mottled lucency over the distal colon is consistent with colonic diverticula. There is no definitive urolithiasis. The sacroiliac joints are fused. There is been L3-4 and L4-5 discectomy with rod and pedicle screw fixation. No evidence of hardware compromise.  IMPRESSION: No evidence of bowel obstruction or perforation.   Electronically Signed   By: Tiburcio PeaJonathan  Watts M.D.   On: 08/17/2013 05:31    Microbiology: Recent Results (from the past 240 hour(s))  CLOSTRIDIUM DIFFICILE BY PCR     Status: None   Collection Time    08/19/13  9:58 PM      Result Value Ref Range Status   C difficile by pcr NEGATIVE  NEGATIVE Final   Comment: [facility]formed at [facility]  CLOSTRIDIUM DIFFICILE BY PCR     Status: None   Collection Time    08/21/13  4:53 PM      Result Value Ref Range Status   C difficile by pcr NEGATIVE  NEGATIVE Final   Comment: Performed at [facility]     Labs: Basic Metabolic Panel:  Recent Labs Lab 08/17/13 0515 08/19/13 1141 08/22/13 0525 08/23/13 0534  NA 140 139 140  --   K 3.5* 3.7 3.4*  --    CL 103 104 105  --   CO2 22 23 25   --   GLUCOSE 106* 129* 115*  --   BUN 14 18 12   --   CREATININE 0.84 0.75 0.74 0.65  CALCIUM 8.6 8.1* 8.8  --  Liver Function Tests: No results found for this basename: AST, ALT, ALKPHOS, BILITOT, PROT, ALBUMIN,  in the last 168 hours No results found for this basename: LIPASE, AMYLASE,  in the last 168 hours No results found for this basename: AMMONIA,  in the last 168 hours CBC:  Recent Labs Lab 08/17/13 0515 08/19/13 1141 08/22/13 0525 08/23/13 0534  WBC 12.5* 7.1 6.8 7.4  HGB 12.9 11.7* 13.1 13.1  HCT 38.7 34.8* 39.0 38.9  MCV 93.5 93.5 92.0 93.7  PLT 224 180 211 190   Cardiac Enzymes: No results found for this basename: CKTOTAL, CKMB, CKMBINDEX, TROPONINI,  in the last 168 hours BNP: BNP (last 3 results) No results found for this basename: PROBNP,  in the last 8760 hours CBG:  Recent Labs Lab 08/16/13 1419  GLUCAP 173*       Signed:  Dickie Labarre  [facility] 08/23/2013, 11:45 AM

## 2013-08-23 NOTE — ED Notes (Addendum)
Per EMS: [facility] and now having N/V and dizziness; pt was discharged to SNF approx 2 hours ago; pt alert at present but dizzy upon any movement with N/V; per son pt was being given meclizine until yesterday

## 2013-08-23 NOTE — ED Notes (Signed)
Pt complaining of vague neck pain and may have increased confusion; EDP aware and family at bedside

## 2013-08-23 NOTE — Progress Notes (Signed)
Spoke with Caryn BeeKevin in CT. Pt experiencing episodes of nausea/vomiting, may be unable to tolerate oral contrast. Notified Toniann FailKakrakandy, MD who stated that it would be ok for patient to have CT without contrast if unable to tolerate.

## 2013-08-23 NOTE — Progress Notes (Signed)
Report taken from Anna, RN in ED.  

## 2013-08-23 NOTE — Progress Notes (Signed)
Report called to nurse Amber at SNF, D/C instructions accompanied pt, left the unit in stable condition via ambulance to SNF.

## 2013-08-23 NOTE — ED Notes (Signed)
Pt is reporting bilateral leg pain.

## 2013-08-23 NOTE — Progress Notes (Signed)
Clinical Social Work  CSW faxed DC summary to [facility] who is agreeable to [facility] patient today. CSW prepared DC packet with FL2, DC summary, and hard scripts included. CSW informed patient and son who are aware and agreeable to plans. Son prefers [facility] to transport and is aware of no guarantee of payment. RN to call report to SNF. [facility] Request #: [id number].  CSW is signing off but available if needed.  CashionHolly Jahmia Marshall, Kentucky[place]CS[place] [phone]

## 2013-08-23 NOTE — Progress Notes (Deleted)
ANTIBIOTIC CONSULT NOTE - INITIAL  Pharmacy Consult for cipro Indication: intra-abdominal infection  Allergies  Allergen Reactions  . Ciprofloxacin     Unknown    Patient Measurements:   Adjusted Body Weight:   Vital Signs: Temp: 99.1 F (37.3 C) (07/09 1717) Temp src: Rectal (07/09 1717) BP: 150/84 mmHg (07/09 2000) Pulse Rate: 74 (07/09 2000) Intake/Output from previous day:   Intake/Output from this shift:    Labs:  Recent Labs  08/22/13 0525 08/23/13 0534 08/23/13 1958  WBC 6.8 7.4 7.1  HGB 13.1 13.1 13.6  PLT 211 190 240  CREATININE 0.74 0.65 0.63   The CrCl is unknown because both a height and weight (above a minimum accepted value) are required for this calculation. No results found for this basename: VANCOTROUGH, Leodis BinetVANCOPEAK, VANCORANDOM, GENTTROUGH, GENTPEAK, GENTRANDOM, TOBRATROUGH, TOBRAPEAK, TOBRARND, AMIKACINPEAK, AMIKACINTROU, AMIKACIN,  in the last 72 hours   Microbiology: Recent Results (from the past 720 hour(s))  CLOSTRIDIUM DIFFICILE BY PCR     Status: None   Collection Time    08/19/13  9:58 PM      Result Value Ref Range Status   C difficile by pcr NEGATIVE  NEGATIVE Final   Comment: Performed at [facility]  CLOSTRIDIUM DIFFICILE BY PCR     Status: None   Collection Time    08/21/13  4:53 PM      Result Value Ref Range Status   C difficile by pcr NEGATIVE  NEGATIVE Final   Comment: [facility]formed at [facility]    Medical History: Past Medical History  Diagnosis Date  . Hypertension   . Arthritis   . Depression   . Allergic rhinitis due to other allergen   . Other and unspecified hyperlipidemia   . Dementia, unspecified, without behavioral disturbance   . Alzheimer's disease   . Unspecified arthropathy, shoulder region   . Anorexia   . Undiagnosed cardiac murmurs   . GERD (gastroesophageal reflux disease)   . Chronic constipation     Medications:  Scheduled:  . [START ON 08/24/2013] atorvastatin  20 mg Oral  Daily  . [START ON 08/24/2013] calcium-vitamin D  1 tablet Oral BID WC  . enoxaparin (LOVENOX) injection  30 mg Subcutaneous Q24H  . [START ON 08/24/2013] feeding supplement (ENSURE COMPLETE)  237 mL Oral BID BM  . gabapentin  100 mg Oral TID  . [START ON 08/24/2013] loratadine  10 mg Oral Daily  . metoprolol tartrate  12.5 mg Oral BID  . metronidazole  500 mg Intravenous Q8H  . [START ON 08/24/2013] pantoprazole  40 mg Oral Daily  . [START ON 08/24/2013] predniSONE  2 mg Oral Q breakfast  . [START ON 08/24/2013] sertraline  25 mg Oral Daily  . traMADol  50 mg Oral TID   Infusions:  . sodium chloride     Assessment: 80 yo female with intra-abdominal infection will be started on cipro.  SCr 0.63 (CrCl ~ 31 if adjust for SCr of 1 due to age)  Goal of Therapy:  Resolution of infection  Plan:  1) Cipro 400mg  iv q12h. Monitor renal function closely  [name], Tsz-Yin 08/23/2013,9:15 PM

## 2013-08-23 NOTE — ED Provider Notes (Signed)
Patient is from nursing home, she has dementia. Patient was admitted to the hospital for about a week with dizziness and nausea and vomiting. She did have some diarrhea with questionable C. difficile. Patient was discharged from the hospital this morning however she was having dizziness and nausea with vomiting as she was being discharged and probably on arrival to her rehabilitation facility. Patient denies headache. She is noted to have something bruising or left cheek from a prior fall.  Patient is elderly frail female. She is noted to have some bruising on her left cheek area of her face. She is very pale. Her tongue is dry.  Medical screening examination/treatment/procedure(s) were conducted as a shared visit with non-physician practitioner(s) and myself.  I personally evaluated the patient during the encounter.   EKG Interpretation None        Devoria AlbeIva Alexi Dorminey, MD, Armando GangFACEP   Ward GivensIva L Edmund Holcomb, MD 08/23/13 Mikle [id number]

## 2013-08-24 ENCOUNTER — Encounter (HOSPITAL_COMMUNITY): Payer: Self-pay

## 2013-08-24 ENCOUNTER — Inpatient Hospital Stay (HOSPITAL_COMMUNITY): Payer: Medicare Other

## 2013-08-24 DIAGNOSIS — S22009A Unspecified fracture of unspecified thoracic vertebra, initial encounter for closed fracture: Secondary | ICD-10-CM

## 2013-08-24 DIAGNOSIS — E860 Dehydration: Secondary | ICD-10-CM

## 2013-08-24 DIAGNOSIS — K5289 Other specified noninfective gastroenteritis and colitis: Secondary | ICD-10-CM

## 2013-08-24 LAB — COMPREHENSIVE METABOLIC PANEL
ALT: 6 U/L (ref 0–35)
ANION GAP: 13 (ref 5–15)
AST: 13 U/L (ref 0–37)
Albumin: 2.9 g/dL — ABNORMAL LOW (ref 3.5–5.2)
Alkaline Phosphatase: 54 U/L (ref 39–117)
BUN: 11 mg/dL (ref 6–23)
CO2: 25 mEq/L (ref 19–32)
CREATININE: 0.69 mg/dL (ref 0.50–1.10)
Calcium: 8.4 mg/dL (ref 8.4–10.5)
Chloride: 103 mEq/L (ref 96–112)
GFR calc Af Amer: 82 mL/min — ABNORMAL LOW (ref >90)
GFR calc non Af Amer: 71 mL/min — ABNORMAL LOW (ref >90)
GLUCOSE: 103 mg/dL — AB (ref 70–99)
POTASSIUM: 3.6 meq/L — AB (ref 3.7–5.3)
Sodium: 141 mEq/L (ref 137–147)
TOTAL PROTEIN: 5.7 g/dL — AB (ref 6.0–8.3)
Total Bilirubin: 0.4 mg/dL (ref 0.3–1.2)

## 2013-08-24 LAB — GI PATHOGEN PANEL BY PCR, STOOL
C DIFFICILE TOXIN A/B: NEGATIVE
CRYPTOSPORIDIUM BY PCR: NEGATIVE
Campylobacter by PCR: NEGATIVE
E COLI (ETEC) LT/ST: NEGATIVE
E COLI 0157 BY PCR: NEGATIVE
E coli (STEC): NEGATIVE
G lamblia by PCR: NEGATIVE
Norovirus GI/GII: NEGATIVE
Rotavirus A by PCR: NEGATIVE
SHIGELLA BY PCR: NEGATIVE
Salmonella by PCR: NEGATIVE

## 2013-08-24 LAB — CBC WITH DIFFERENTIAL/PLATELET
BASOS ABS: 0 10*3/uL (ref 0.0–0.1)
Basophils Relative: 1 % (ref 0–1)
Eosinophils Absolute: 0.2 10*3/uL (ref 0.0–0.7)
Eosinophils Relative: 2 % (ref 0–5)
HCT: 37 % (ref 36.0–46.0)
HEMOGLOBIN: 12.3 g/dL (ref 12.0–15.0)
Lymphocytes Relative: 14 % (ref 12–46)
Lymphs Abs: 1 10*3/uL (ref 0.7–4.0)
MCH: 31.2 pg (ref 26.0–34.0)
MCHC: 33.2 g/dL (ref 30.0–36.0)
MCV: 93.9 fL (ref 78.0–100.0)
MONOS PCT: 11 % (ref 3–12)
Monocytes Absolute: 0.8 10*3/uL (ref 0.1–1.0)
NEUTROS ABS: 5.5 10*3/uL (ref 1.7–7.7)
NEUTROS PCT: 72 % (ref 43–77)
Platelets: 236 10*3/uL (ref 150–400)
RBC: 3.94 MIL/uL (ref 3.87–5.11)
RDW: 14.4 % (ref 11.5–15.5)
WBC: 7.6 10*3/uL (ref 4.0–10.5)

## 2013-08-24 LAB — TSH: TSH: 1.39 u[IU]/mL (ref 0.350–4.500)

## 2013-08-24 MED ORDER — IOHEXOL 300 MG/ML  SOLN: 80.0000 mL | Freq: Once | INTRAMUSCULAR | Status: AC | PRN

## 2013-08-24 MED ORDER — MECLIZINE HCL 12.5 MG PO TABS
12.5000 mg | ORAL_TABLET | Freq: Three times a day (TID) | ORAL | Status: DC
  Administered 2013-08-24 – 2013-08-26 (×5): 12.5 mg via ORAL
  Filled 2013-08-24 (×7): qty 1

## 2013-08-24 MED ORDER — SODIUM CHLORIDE 0.9 % IJ SOLN
INTRAMUSCULAR | Status: AC
  Filled 2013-08-24: qty 50

## 2013-08-24 MED ORDER — ALPRAZOLAM 0.25 MG PO TABS
0.2500 mg | ORAL_TABLET | Freq: Every day | ORAL | Status: DC
  Administered 2013-08-24 – 2013-08-26 (×3): 0.25 mg via ORAL
  Filled 2013-08-24 (×3): qty 1

## 2013-08-24 MED ORDER — ALPRAZOLAM 0.25 MG PO TABS: 0.2500 mg | ORAL_TABLET | Freq: Two times a day (BID) | ORAL | Status: DC | PRN

## 2013-08-24 NOTE — Progress Notes (Signed)
INITIAL NUTRITION ASSESSMENT  DOCUMENTATION CODES Per approved criteria  -Not Applicable   INTERVENTION: Continue Ensure Complete. Further diet advancement per MD discretion. RD to continue to follow nutrition care plan.  NUTRITION DIAGNOSIS: Inadequate oral intake related to altered GI function as evidenced by need for slow diet advancement.   Goal: Intake to meet >90% of estimated nutrition needs.  Monitor:  weight trends, lab trends, I/O's, PO intake, supplement tolerance  Reason for Assessment: Malnutrition Screening Tool  80 y.o. female  Admitting Dx: Nausea and vomiting  ASSESSMENT: PMHx significant for dementia, HTN, hyperlipidemia. Admitted with n/v/d. Work-up ongoing.  Currently ordered for Clear Liquids, pt refused breakfast x 2. Confirmed with RN. Also ordered for Ensure Complete PO BID. P  Physical exam was WNL - pt with loss of muscle and fat 2/2 advanced age. Pt is at nutrition risk, however 2/2 dementia and meal refusals.  Potassium low at 3.6   Height: Ht Readings from Last 1 Encounters:  08/23/13 5\' 6"  (1.676 m)    Weight: Wt Readings from Last 1 Encounters:  08/23/13 132 lb 4.4 oz (60 kg)    Ideal Body Weight: 130 lb  % Ideal Body Weight: 102%  Wt Readings from Last 10 Encounters:  08/23/13 132 lb 4.4 oz (60 kg)  08/16/13 132 lb 7.9 oz (60.1 kg)  06/15/12 132 lb 4.4 oz (60 kg)  08/16/11 126 lb 1.7 oz (57.2 kg)  04/01/11 132 lb 3.2 oz (59.966 kg)  01/08/11 137 lb 12.6 oz (62.5 kg)    Usual Body Weight: 132 lb  % Usual Body Weight: 100%  BMI:  Body mass index is 21.36 kg/(m^2). WNL  Estimated Nutritional Needs: Kcal: 1300 - 1500 Protein: 60 - 70 g Fluid: approx 1.5 liters  Skin: intact  Diet Order: Clear Liquid  EDUCATION NEEDS: -Education not appropriate at this time   Intake/Output Summary (Last 24 hours) at 08/24/13 1130 Last data filed at 08/24/13 0900  Gross per 24 hour  Intake      0 ml  Output      0 ml  Net       0 ml    Last BM: 7/9  Labs:   Recent Labs Lab 08/22/13 0525 08/23/13 0534 08/23/13 1958 08/24/13 0726  NA 140  --  140 141  K 3.4*  --  4.2 3.6*  CL 105  --  102 103  CO2 25  --  22 25  BUN 12  --  10 11  CREATININE 0.74 0.65 0.63 0.69  CALCIUM 8.8  --  8.9 8.4  GLUCOSE 115*  --  131* 103*    CBG (last 3)  No results found for this basename: GLUCAP,  in the last 72 hours  Scheduled Meds: . ALPRAZolam  0.25 mg Oral QHS  . atorvastatin  20 mg Oral Daily  . calcium-vitamin D  1 tablet Oral BID WC  . enoxaparin (LOVENOX) injection  30 mg Subcutaneous Q24H  . feeding supplement (ENSURE COMPLETE)  237 mL Oral BID BM  . gabapentin  100 mg Oral TID  . loratadine  10 mg Oral Daily  . metoprolol tartrate  12.5 mg Oral BID  . metronidazole  500 mg Intravenous Q8H  . pantoprazole  40 mg Oral Daily  . predniSONE  2 mg Oral Q breakfast  . sertraline  25 mg Oral Daily  . sodium chloride      . traMADol  50 mg Oral TID    Continuous Infusions: .  sodium chloride 75 mL/hr at 08/23/13 2121    Past Medical History  Diagnosis Date  . Hypertension   . Arthritis   . Depression   . Allergic rhinitis due to other allergen   . Other and unspecified hyperlipidemia   . Dementia, unspecified, without behavioral disturbance   . Alzheimer's disease   . Unspecified arthropathy, shoulder region   . Anorexia   . Undiagnosed cardiac murmurs   . GERD (gastroesophageal reflux disease)   . Chronic constipation     Past Surgical History  Procedure Laterality Date  . Back surgery    . Tonsillectomy    . Abdominal hysterectomy      Jarold MottoSamantha Oakley Kossman MS, RD, LDN Inpatient Registered Dietitian Pager: [phone] After-hours pager: [phone]

## 2013-08-24 NOTE — H&P (Signed)
[facility] History and Physical  Angelica Marshall UJW:[id number] DOB: 03-Dec-1915 DOA: 08/23/2013  Referring physician: ER physician. PCP: MAZZOCCHI, Rise Mu, MD   Chief Complaint: Nausea vomiting. History obtained from patient's son and medical records. Patient has dementia.  HPI: Angelica Marshall is a 80 y.o. female with history of dementia, hypertension, hyperlipidemia who was discharged this morning after being admitted for nausea vomiting and diarrhea presents to the ER because of recurrence of nausea vomiting after patient entered the nursing home. Patient's nausea and vomiting was witnessed by patient's son and states that she had multiple episodes. Patient was admitted last week to the hospital because of nausea vomiting and diarrhea and at that time stool for C. difficile was negative and patient was empirically placed on Flagyl. Day before patient her discharge patient was able to tolerate diet patient's care was upgraded to nursing home. Patient also has been having dizziness. In the ER after patient was brought back from the nursing home due to nausea and vomiting MRI of the brain was done which did not show anything acute. On my exam patient abdomen appears benign. Patient is still having nausea and vomiting. Labs are pending. Patient has been admitted for further management. Patient has been complaining of hip pain most of the right side which patient's son states is chronic.  Review of Systems: As presented in the history of presenting illness, rest negative.  Past Medical History  Diagnosis Date  . Hypertension   . Arthritis   . Depression   . Allergic rhinitis due to other allergen   . Other and unspecified hyperlipidemia   . Dementia, unspecified, without behavioral disturbance   . Alzheimer's disease   . Unspecified arthropathy, shoulder region   . Anorexia   . Undiagnosed cardiac murmurs   . GERD (gastroesophageal reflux disease)   . Chronic constipation    Past  Surgical History  Procedure Laterality Date  . Back surgery    . Tonsillectomy    . Abdominal hysterectomy     Social History:  reports that she has quit smoking. She has never used smokeless tobacco. She reports that she does not drink alcohol or use illicit drugs. Where does patient live in assisted living and now nursing home. Can patient participate in ADLs? Not sure.  Allergies  Allergen Reactions  . Ciprofloxacin     Unknown    Family History:  Family History  Problem Relation Age of Onset  . Diabetes Brother   . Alcoholism Father   . Crohn's disease Son     Died at age 64 from GI complications      Prior to Admission medications   Medication Sig Start Date End Date Taking? Authorizing Provider  acetaminophen (TYLENOL) 500 MG tablet Take 500 mg by mouth 3 (three) times daily.    Historical Provider, MD  ALPRAZolam Prudy Feeler) 0.25 MG tablet Take 1 tablet (0.25 mg total) by mouth 3 (three) times daily as needed for anxiety or sleep (take 2 times daily as needed but scheduled at bedtime). 08/23/13   Nishant Dhungel, MD  atorvastatin (LIPITOR) 20 MG tablet Take 20 mg by mouth daily.      Historical Provider, MD  calcium-vitamin D (OSCAL WITH D) 500-200 MG-UNIT per tablet Take 1 tablet by mouth 2 (two) times daily.    Historical Provider, MD  CRANBERRY EXTRACT PO Take 475 mg by mouth 2 (two) times daily.     Historical Provider, MD  Ensure Plus (ENSURE PLUS) LIQD Take 237 mLs by mouth  2 (two) times daily.    Historical Provider, MD  esomeprazole (NEXIUM) 40 MG capsule Take 40 mg by mouth daily before breakfast.      Historical Provider, MD  gabapentin (NEURONTIN) 100 MG capsule Take 100 mg by mouth 3 (three) times daily.    Historical Provider, MD  loperamide (IMODIUM) 2 MG capsule Take 2 mg by mouth 4 (four) times daily as needed for diarrhea or loose stools.    Historical Provider, MD  loratadine (CLARITIN) 10 MG tablet Take 10 mg by mouth daily.    Historical Provider, MD   Melatonin 3 MG TABS Take 1 tablet by mouth at bedtime.    Historical Provider, MD  metoprolol tartrate (LOPRESSOR) 25 MG tablet Take 12.5 mg by mouth 2 (two) times daily.    Historical Provider, MD  nitrofurantoin, macrocrystal-monohydrate, (MACROBID) 100 MG capsule Take 100 mg by mouth daily.    Historical Provider, MD  ondansetron (ZOFRAN) 4 MG tablet Take 4 mg by mouth every 6 (six) hours as needed for nausea.    Historical Provider, MD  predniSONE (DELTASONE) 1 MG tablet Take 2 mg by mouth daily with breakfast.    Historical Provider, MD  senna (SENOKOT) 8.6 MG TABS Take 1 tablet by mouth 2 (two) times daily.    Historical Provider, MD  sertraline (ZOLOFT) 50 MG tablet Take 25 mg by mouth daily.     Historical Provider, MD  simethicone (MYLICON) 80 MG chewable tablet Chew 80 mg by mouth every 6 (six) hours as needed for flatulence.     Historical Provider, MD  traMADol (ULTRAM) 50 MG tablet Take 1 tablet (50 mg total) by mouth 3 (three) times daily. 08/23/13   Nishant Dhungel, MD    Physical Exam: Filed Vitals:   08/23/13 2000 08/23/13 2127 08/23/13 2242 08/24/13 0222  BP: 150/84 159/78 125/80 148/83  Pulse: 74 62 60 81  Temp:  97.9 F (36.6 C)  98.1 F (36.7 C)  TempSrc:  Oral  Oral  Resp: 22 20  18   Height:  5\' 6"  (1.676 m)    Weight:  60 kg (132 lb 4.4 oz)    SpO2: 100% 97%  98%     General:  Moderately built and nourished.  Eyes: Mild erythema around the left eye.  ENT: No discharge from the ears eyes nose mouth.  Neck: No mass felt.  Cardiovascular: S1-S2 heard.  Respiratory: No rhonchi or crepitations.  Abdomen: Soft nontender bowel sounds present.  Skin: Erythema around the left eye.  Musculoskeletal: No edema. Pain on moving the right hip.  Psychiatric: Patient is demented.  Neurologic: Alert awake oriented to her name and follows commands. Moves all extremities.  Labs on Admission:  Basic Metabolic Panel:  Recent Labs Lab 08/17/13 0515 08/19/13 1141  08/22/13 0525 08/23/13 0534 08/23/13 1958  NA 140 139 140  --  140  K 3.5* 3.7 3.4*  --  4.2  CL 103 104 105  --  102  CO2 22 23 25   --  22  GLUCOSE 106* 129* 115*  --  131*  BUN 14 18 12   --  10  CREATININE 0.84 0.75 0.74 0.65 0.63  CALCIUM 8.6 8.1* 8.8  --  8.9   Liver Function Tests:  Recent Labs Lab 08/23/13 1958  AST 16  ALT 7  ALKPHOS 59  BILITOT 0.5  PROT 6.5  ALBUMIN 3.2*    Recent Labs Lab 08/23/13 1958  LIPASE 40   No results found for this  basename: AMMONIA,  in the last 168 hours CBC:  Recent Labs Lab 08/17/13 0515 08/19/13 1141 08/22/13 0525 08/23/13 0534 08/23/13 1958  WBC 12.5* 7.1 6.8 7.4 7.1  NEUTROABS  --   --   --   --  5.5  HGB 12.9 11.7* 13.1 13.1 13.6  HCT 38.7 34.8* 39.0 38.9 40.4  MCV 93.5 93.5 92.0 93.7 92.9  PLT 224 180 211 190 240   Cardiac Enzymes:  Recent Labs Lab 08/23/13 1958  CKTOTAL 104  TROPONINI <0.30    BNP (last 3 results) No results found for this basename: PROBNP,  in the last 8760 hours CBG: No results found for this basename: GLUCAP,  in the last 168 hours  Radiological Exams on Admission: Mr Brain Wo Contrast  08/23/2013   CLINICAL DATA:  Alzheimer's.  Hypertension.  Dizziness.  EXAM: MRI HEAD WITHOUT CONTRAST  TECHNIQUE: Multiplanar, multiecho pulse sequences of the brain and surrounding structures were obtained without intravenous contrast.  COMPARISON:  08/16/2013 CT.  No comparison MR.  FINDINGS: No acute infarct.  Prominent mineral deposition dentate nucleus bilaterally.  Scattered blood breakdown products suggestive of result of prior hemorrhagic ischemia.  Mild to moderate small vessel disease type changes.  Global atrophy. Ventricular prominence felt to be related to atrophy rather than hydrocephalus.  No intracranial mass lesion noted on this unenhanced exam.  Major intracranial vascular structures are patent. Atherosclerotic type changes of the vertebral arteries suspected bilaterally.  Cervical  spondylotic changes with mild spinal stenosis.  Cervical medullary junction, pituitary region, pineal region and orbital structures unremarkable.  IMPRESSION: No acute infarct.  Please see above.   Electronically Signed   By: Bridgett Larsson M.D.   On: 08/23/2013 19:14   Dg Pelvis Portable  08/24/2013   CLINICAL DATA:  Right hip pain.  EXAM: PORTABLE PELVIS 1-2 VIEWS  COMPARISON:  08/14/2011  FINDINGS: Diffuse bone demineralization. Postoperative changes in the lower lumbar spine. Ankylosis of the sacroiliac joints bilaterally. No displaced fractures demonstrated in the pelvis. Left hip appears externally rotated but otherwise intact. Occult fractures may be obscured by the rotation. Sclerosis in the proximal left femur consistent with bone infarct versus enchondroma. No significant change since prior study.  IMPRESSION: No acute bony abnormality suggested. Stable appearance of chronic changes. Rotation of the left hip.   Electronically Signed   By: Burman Nieves M.D.   On: 08/24/2013 00:39   Dg Chest Port 1 View  08/24/2013   CLINICAL DATA:  Infiltrates.  EXAM: PORTABLE CHEST - 1 VIEW  COMPARISON:  08/16/2013  FINDINGS: Shallow inspiration with elevation of the left hemidiaphragm. Normal heart size and pulmonary vascularity. Central interstitial changes likely representing chronic bronchitis. Calcification of the aorta. Calcification in the mitral valve annulus. No focal airspace disease. Suggestion of slight blunting of left costophrenic angle, possibly indicating small effusion.  IMPRESSION: Chronic bronchitic changes in the lungs. Probable small left pleural effusion. No focal consolidation.   Electronically Signed   By: Burman Nieves M.D.   On: 08/24/2013 00:51     Assessment/Plan Principal Problem:   Nausea and vomiting Active Problems:   HTN (hypertension)   Alzheimer's disease   Nausea & vomiting   1. Persistent nausea and vomiting - since patient has been having persistent symptoms I  have ordered CT abdomen and pelvis. Check stool studies if patient continues to have diarrhea. Continue with gentle hydration. Continue with antiemetics. I have placed patient empirically on Flagyl for now and continue patient's Macrodantin which patient  takes for suppressive therapy for UTI. Labs are pending including metabolic panel LFTs and CBC. Since patient also has dizziness and patient's son states that her symptoms did improve with meclizine during last admission I have placed patient on when necessary meclizine. 2. Hypertension - continue home medications. 3. Hyperlipidemia - continue present medications. 4. Dementia. 5. On chronic steroids - not sure why patient is on steroids. But patient is on low dose. I have written one dose of IV hydrocortisone for now as stress dose.    Code Status: DO NOT RESUSCITATE.  Family Communication: Patient's son.  Disposition Plan: Admit to inpatient.    Angelica Marshall N. [facility] Pager [phone].  If 7PM-7AM, please contact night-coverage [email or web address] Password [email or web address] 08/24/2013, 2:27 AM

## 2013-08-24 NOTE — Clinical Social Work Psychosocial (Signed)
Clinical Social Work Department BRIEF PSYCHOSOCIAL ASSESSMENT 08/24/2013  Patient:  Angelica Marshall,Angelica Marshall     Account Number:  [id number]     Admit date:  08/23/2013  Clinical Social Worker:  Lavell LusterAMPBELL,Ciarra Braddy BRYANT, LCSWA  Date/Time:  08/24/2013 10:58 AM  Referred by:  Physician  Date Referred:  08/24/2013 Referred for  SNF Placement   Other Referral:   Interview type:  Family Other interview type:   Patient's son interviewed to complete assessment.    PSYCHOSOCIAL DATA Living Status:  FACILITY Admitted from facility:  Other Level of care:  Assisted Living Primary support name:  Angelica Marshall Primary support relationship to patient:  CHILD, ADULT Degree of support available:   Support is strong.    CURRENT CONCERNS Current Concerns  Post-Acute Placement   Other Concerns:    SOCIAL WORK ASSESSMENT / PLAN CSW spoke with patient's son by phone to complete assessment. Patient's son Angelica [facility] states that [facility] but that the patient is going to require SNF placement for rehab then probably [facility] term [facility]. Angelica Marshall states that he had a bed for the [facility] at [facility], but "Angelica Marshall released her too quickly from the last admission which is why she is back." Angelica Marshall appears very frustrated by the last hospitalization and this hospitalization and "just wants answers." CSW explained that SNF referral would be made to [facility]. CSW will follow up with bed offers.   Assessment/plan status:  Psychosocial Support/Ongoing Assessment of Needs Other assessment/ plan:   Complete Fl2, Fax, PASRR   Information/referral to community resources:   CSW contact information and SNF list given.    PATIENT'S/FAMILY'S RESPONSE TO PLAN OF CARE: Patient's son plans for the patient to DC to SNF. CSW will assist.       Roddie McBryant Jaymee Tilson MSW, BledsoeLCSWA, [place], [phone]

## 2013-08-24 NOTE — Evaluation (Signed)
Occupational Therapy Evaluation Patient Details Name: Angelica Marshall Vanmeter MRN: [id number] DOB: March 16, 1915 Today's Date: 08/24/2013    History of Present Illness Angelica Marshall is a(n) 80 y.o. female who presents emergency department 08/23/13 via EMS for nausea, vomiting and symptoms of vertigo. The patient was [facility] as 08/16/2013 for the same symptoms. The patient was found to have an elevated lactate and elevated white count making her SIRS positive. No actual source was found. The patient was found to have an equivocal C. Diff and was given vanc, later C. Diff was found to be negative. She received Flagyl and fluid and her labs appeared to be normalizing. . The patient was discharged 08/23/13 from [facility] and return to her assisted living facility. When she arrived at her assisted living facility she was complaining of the room spinning around her, nausea and vomiting. She was returned to: Emergency department. . Her symptoms began the night before Wednesday 71 acutely. She has no previous history of vertigo. PMH includeds  HTN, dementia, depression   Clinical Impression   Pt admitted with above. She demonstrates the below listed deficits and will benefit from continued OT to maximize safety and independence with BADLs.  No family is present to provide info re: pt's baseline level of functioning.  Currently, pt requires total A with all BADLs, and mod A for functional mobility.  She does complain of dizziness with all mobility - states "the room is spinning".  She sat EOB x ~14 mins with min guard assist with no LOB while working with OT, however, when she transferred to recliner, she was noted to lean heavily to the Rt - complaint of increased dizziness with transfer.  Attempted to assess for nystagmus, however, pt unable to accurately participate in visual assessment due to dementia      Follow Up Recommendations  SNF    Equipment Recommendations  None recommended by OT     Recommendations for Other Services       Precautions / Restrictions Precautions Precautions: Fall Precaution Comments: Hx Alzheimers/dementia      Mobility Bed Mobility Overal bed mobility: Needs Assistance Bed Mobility: Supine to Sit     Supine to sit: Max assist     General bed mobility comments: assist for all aspects.  Pt very slow to initiate movement  Transfers Overall transfer level: Needs assistance Equipment used: 1 person hand held assist Transfers: Sit to/from BJ'sStand;Stand Pivot Transfers Sit to Stand: Mod assist Stand pivot transfers: Mod assist       General transfer comment: mod A for sit <> stand and min A for taking shuffling steps to recliner    Balance Overall balance assessment: Needs assistance Sitting-balance support: Feet supported Sitting balance-Leahy Scale: Fair Sitting balance - Comments: Pt sat EOB while doffing socks with no LOB; however, when pt transferred to the recliner she was leaning heavily to the Rt. - complaint of dizziness Postural control: Right lateral lean Standing balance support: Bilateral upper extremity supported Standing balance-Leahy Scale: Poor                              ADL Overall ADL's : Needs assistance/impaired Eating/Feeding: Maximal assistance;Sitting (to drink from cup)   Grooming: Wash/dry hands;Wash/dry face;Oral care;Brushing hair;Total assistance;Sitting   Upper Body Bathing: Total assistance;Sitting   Lower Body Bathing: Total assistance;Sit to/from stand   Upper Body Dressing : Total assistance;Sitting   Lower Body Dressing: Sit to/from stand;Total assistance  Lower Body Dressing Details (indicate cue type and reason): Pt able to doff socks with supervision and increased time, but unable to don them due to increased pain  Toilet Transfer: Moderate assistance;Ambulation;BSC Toilet Transfer Details (indicate cue type and reason): Able to take shuffling steps ~3' to chair Toileting-  Clothing Manipulation and Hygiene: Total assistance;Sit to/from stand       Functional mobility during ADLs: Moderate assistance General ADL Comments: Pt moves very slowly.  She was incontinetn of urine and was assisted with clean up.  Pt able to don socks, but unable to don them due to chronic (per chart) hip pain.  Pt with complaint of dizziness with movement.  She states "I'm spinning":  When she was transferred to chair she leaned to the right requiring asist to correct.  She did not lose her balance while sitting EOB     Vision                 Additional Comments: Pt wtih difficulty with testing.  She demonstrates Full EOMs.  She will track object but loses fixation frequently    Perception     Praxis Praxis Praxis tested?: Deficits Deficits: Ideomotor    Pertinent Vitals/Pain Complaint of Rt. Hip pain (she is unable to rate) while attempting to engage in LB ADLs.      Hand Dominance Right   Extremity/Trunk Assessment Upper Extremity Assessment Upper Extremity Assessment: Generalized weakness   Lower Extremity Assessment Lower Extremity Assessment: Defer to PT evaluation   Cervical / Trunk Assessment Cervical / Trunk Assessment: Kyphotic   Communication Communication Communication: Other (comment) (very slow to respond)   Cognition Arousal/Alertness: Awake/alert Behavior During Therapy: WFL for tasks assessed/performed Overall Cognitive Status: No family/caregiver present to determine baseline cognitive functioning (h/o Alzheimer's)       Memory: Decreased short-term memory             General Comments       Exercises       Shoulder Instructions      Home Living Family/patient expects to be discharged to:: Skilled nursing facility                                 Additional Comments: Pt previously resided at [facility]       Prior Functioning/Environment Level of Independence: Needs assistance        Comments: Pt  unable to provide info re: PLOF.  Per chart review, pt was ambulatory with RW at ALF, but had been experiencing falls     OT Diagnosis: Generalized weakness;Cognitive deficits   OT Problem List: Decreased strength;Decreased activity tolerance;Impaired balance (sitting and/or standing);Decreased cognition;Decreased safety awareness;Decreased knowledge of use of DME or AE   OT Treatment/Interventions: Self-care/ADL training;DME and/or AE instruction;Neuromuscular education;Therapeutic activities;Patient/family education;Balance training    OT Goals(Current goals can be found in the care plan section) Acute Rehab OT Goals OT Goal Formulation: Patient unable to participate in goal setting Time For Goal Achievement: 09/07/13 Potential to Achieve Goals: Fair ADL Goals Pt Will Perform Grooming: with min assist;sitting Pt Will Perform Upper Body Bathing: with mod assist;sitting Pt Will Perform Lower Body Bathing: with mod assist;sit to/from stand Pt Will Perform Lower Body Dressing: with mod assist;sit to/from stand Pt Will Transfer to Toilet: with min assist;ambulating;regular height toilet;bedside commode;grab bars Pt Will Perform Toileting - Clothing Manipulation and hygiene: with mod assist;sit to/from stand  OT Frequency: Min 2X/week  Barriers to D/C: Decreased caregiver support          Co-evaluation              End of Session Nurse Communication: Mobility status;Other (comment) (dizziness)  Activity Tolerance: Other (comment) (limited by dizziness) Patient left: in chair;with call bell/phone within reach (Chair positioned in doorway to allow nsg staff to monitor)   Time: 1332-1406 OT Time Calculation (min): 34 min Charges:  OT General Charges $OT Visit: 1 Procedure OT Evaluation $Initial OT Evaluation Tier I: 1 Procedure OT Treatments $Self Care/Home Management : 8-22 mins $Therapeutic Activity: 8-22 mins G-Codes:    Aline Wesche M September 23, 2013, 2:30 PM

## 2013-08-24 NOTE — Evaluation (Signed)
Physical Therapy Evaluation Patient Details Name: Cory Rama MRN: [id number] DOB: 08-06-1915 Today's Date: 08/24/2013   History of Present Illness  Breunna Nordmann is a(n) 80 y.o. female who presents emergency department 08/23/13 via EMS for nausea, vomiting and symptoms of vertigo. The patient was admitted to [facility] long as 08/16/2013 for the same symptoms. The patient was found to have an elevated lactate and elevated white count making her SIRS positive. No actual source was found. The patient was found to have an equivocal C. Diff and was given vanc, later C. Diff was found to be negative. She received Flagyl and fluid and her labs appeared to be normalizing. . The patient was discharged 08/23/13 from [facility] and return to her assisted living facility. When she arrived at her assisted living facility she was complaining of the room spinning around her, nausea and vomiting. She was returned to: Emergency department. . Her symptoms began the night before Wednesday 73 acutely. She has no previous history of vertigo. PMH includeds  HTN, dementia, depression  Clinical Impression  Patient presents with problems listed below.  Will benefit from acute PT to maximize functional mobility prior to discharge.  Recommend SNF at discharge for continued therapy.    Follow Up Recommendations SNF;Supervision/Assistance - 24 hour    Equipment Recommendations  None recommended by PT    Recommendations for Other Services       Precautions / Restrictions Precautions Precautions: Fall Precaution Comments: Hx Alzheimers/dementia Restrictions Weight Bearing Restrictions: No      Mobility  Bed Mobility Overal bed mobility: Needs Assistance Bed Mobility: Supine to Sit;Sit to Supine     Supine to sit: Max assist Sit to supine: Max assist   General bed mobility comments: Max assist for all bed mobility.  Increased time to initiate mobility.  Patient sat at EOB x 7 minutes with min guard assist for  balance.  Able to perform UE and LE movements while sitting, maintaining balance.    Transfers                    Ambulation/Gait                Stairs            Wheelchair Mobility    Modified Rankin (Stroke Patients Only)       Balance Overall balance assessment: Needs assistance Sitting-balance support: No upper extremity supported;Feet supported Sitting balance-Leahy Scale: Fair                                       Pertinent Vitals/Pain     Home Living Family/patient expects to be discharged to:: Skilled nursing facility                 Additional Comments: Pt previously resided at [facility]     Prior Function Level of Independence: Needs assistance         Comments: Pt unable to provide info re: PLOF.  Per chart review, pt was ambulatory with RW at ALF, but had been experiencing falls      Hand Dominance   Dominant Hand: Right    Extremity/Trunk Assessment   Upper Extremity Assessment: Defer to OT evaluation           Lower Extremity Assessment: Generalized weakness      Cervical / Trunk Assessment: Kyphotic  Communication   Communication: Other (  comment) (Delayed response to questions)  Cognition Arousal/Alertness: Awake/alert Behavior During Therapy: WFL for tasks assessed/performed Overall Cognitive Status: No family/caregiver present to determine baseline cognitive functioning (h/o Alzheimer's)       Memory: Decreased short-term memory              General Comments      Exercises        Assessment/Plan    PT Assessment Patient needs continued PT services  PT Diagnosis Difficulty walking;Generalized weakness;Altered mental status   PT Problem List Decreased strength;Decreased activity tolerance;Decreased balance;Decreased mobility;Decreased cognition;Decreased knowledge of use of DME  PT Treatment Interventions DME instruction;Gait training;Functional mobility  training;Balance training;Patient/family education   PT Goals (Current goals can be found in the Care Plan section) Acute Rehab PT Goals Patient Stated Goal: None stated PT Goal Formulation: Patient unable to participate in goal setting Time For Goal Achievement: 08/31/13 Potential to Achieve Goals: Fair    Frequency Min 3X/week   Barriers to discharge        Co-evaluation               End of Session   Activity Tolerance: Patient limited by fatigue Patient left: in bed;with call bell/phone within reach;with bed alarm set           Time: 1841-1856 PT Time Calculation (min): 15 min   Charges:   PT Evaluation $Initial PT Evaluation Tier I: 1 Procedure PT Treatments $Therapeutic Activity: 8-22 mins   PT G Codes:          Vena AustriaDavis, Aisa Schoeppner H 08/24/2013, 7:37 PM Durenda HurtSusan H. Renaldo Fiddleravis, PT, [facility] Acute Rehab Services Pager [phone]

## 2013-08-24 NOTE — Progress Notes (Signed)
Pt admitted to unit, 5W15 via stretcher. Pt c/o dizziness, but in no apparent distress. Pt is alert to self and place. Son is at bedside w/ pt belongings. Skin intact, bottom pink but blanchable. Call bell placed within reach. Bed in lowest position, bed alarm in place. Prophylactic foam in place. IV intact. Will continue to monitor patient per MD orders.

## 2013-08-24 NOTE — Treatment Plan (Signed)
Cdiff recently neg. CT abd without signs of acute colitis. Pan stool studies neg. Therefore, will d/c contact precautions. Advance diet as tolerated. Will also d/c flagyl as ct is without acute colitis

## 2013-08-24 NOTE — Care Management Note (Signed)
    Page 1 of 1   08/24/2013     3:40:42 PM CARE MANAGEMENT NOTE 08/24/2013  Patient:  Angelica Marshall,Angelica Marshall   Account Number:  [id number]  Date Initiated:  08/23/2013  Documentation initiated by:  [facility]Jemarion Roycroft  Subjective/Objective Assessment:   80 year old female admitted with sepsis.     Action/Plan:   SNF at d/c.   Anticipated DC Date:  08/23/2013   Anticipated DC Plan:  SKILLED NURSING FACILITY  In-house referral  Clinical Social Worker      DC Planning Services  CM consult      Choice offered to / List presented to:             Status of service:  Completed, signed off Medicare Important Message given?  YES (If response is "NO", the following Medicare IM given date fields will be blank) Date Medicare IM given:  08/23/2013 Medicare IM given by:  [facility]Tiphanie Vo Date Additional Medicare IM given:   Additional Medicare IM given by:    Discharge Disposition:    Per UR Regulation:    If discussed at Long Length of Stay Meetings, dates discussed:    Comments:

## 2013-08-24 NOTE — Progress Notes (Signed)
[facility] PROGRESS NOTE  Angelica Marshall ZOX:[id number] DOB: October 07, 1915 DOA: 08/23/2013 PCP: MAZZOCCHI, Rise Mu, MD  Assessment/Plan: 1. Intractable n/v 1. No acute inflammatory process on ct abd 2. Question infectious gastroenteritis 3. Recent stool studies neg for GI pathogen 4. Pt has been continued on flagyl 5. Afebrile. No leukocytosis 6. Cont with hydration and supportive care. 2. HTN 1. BP stable and controlled 2. Cont current regimen 3. HLD 1. Cont statin 4. Dementia 1. Stable 5. DVT prophylaxis 1. lovenox  Code Status: DNR Family Communication: pt in room Disposition Plan: Pending  Consultants:  none  Procedures:    Antibiotics:  Flagyl 7/9>>>  HPI/Subjective: No acute events noted overnight. Pt reports feeling generally ill this AM  Objective: Filed Vitals:   08/23/13 2242 08/24/13 0222 08/24/13 0627 08/24/13 1028  BP: 125/80 148/83 115/71 139/78  Pulse: 60 81 65 75  Temp:  98.1 F (36.7 C) 98.7 F (37.1 C)   TempSrc:  Oral Oral   Resp:  18 15   Height:      Weight:      SpO2:  98% 98%     Intake/Output Summary (Last 24 hours) at 08/24/13 1255 Last data filed at 08/24/13 0900  Gross per 24 hour  Intake      0 ml  Output      0 ml  Net      0 ml   Filed Weights   08/23/13 2127  Weight: 132 lb 4.4 oz (60 kg)    Exam:   General:  Awake, in nad  Cardiovascular: regular, s1, s2  Respiratory: normal resp effort, on wheezing  Abdomen: soft, nondistended  Musculoskeletal: perfused, no clubbing   Data Reviewed: Basic Metabolic Panel:  Recent Labs Lab 08/19/13 1141 08/22/13 0525 08/23/13 0534 08/23/13 1958 08/24/13 0726  NA 139 140  --  140 141  K 3.7 3.4*  --  4.2 3.6*  CL 104 105  --  102 103  CO2 23 25  --  22 25  GLUCOSE 129* 115*  --  131* 103*  BUN 18 12  --  10 11  CREATININE 0.75 0.74 0.65 0.63 0.69  CALCIUM 8.1* 8.8  --  8.9 8.4   Liver Function Tests:  Recent Labs Lab 08/23/13 1958 08/24/13 0726   AST 16 13  ALT 7 6  ALKPHOS 59 54  BILITOT 0.5 0.4  PROT 6.5 5.7*  ALBUMIN 3.2* 2.9*    Recent Labs Lab 08/23/13 1958  LIPASE 40   No results found for this basename: AMMONIA,  in the last 168 hours CBC:  Recent Labs Lab 08/19/13 1141 08/22/13 0525 08/23/13 0534 08/23/13 1958 08/24/13 0726  WBC 7.1 6.8 7.4 7.1 7.6  NEUTROABS  --   --   --  5.5 5.5  HGB 11.7* 13.1 13.1 13.6 12.3  HCT 34.8* 39.0 38.9 40.4 37.0  MCV 93.5 92.0 93.7 92.9 93.9  PLT 180 211 190 240 236   Cardiac Enzymes:  Recent Labs Lab 08/23/13 1958  CKTOTAL 104  TROPONINI <0.30   BNP (last 3 results) No results found for this basename: PROBNP,  in the last 8760 hours CBG: No results found for this basename: GLUCAP,  in the last 168 hours  Recent Results (from the past 240 hour(s))  CLOSTRIDIUM DIFFICILE BY PCR     Status: None   Collection Time    08/19/13  9:58 PM      Result Value Ref Range Status   C difficile by  pcr NEGATIVE  NEGATIVE Final   Comment: Per[facility]  CLOSTRIDIUM DIFFICILE BY PCR     Status: None   Collection Time    08/21/13  4:53 PM      Result Value Ref Range Status   C difficile by pcr NEGATIVE  NEGATIVE Final   Comment: Performed at [facility]  MRSA PCR SCREENING     Status: None   Collection Time    08/23/13  9:28 PM      Result Value Ref Range Status   MRSA by PCR NEGATIVE  NEGATIVE Final   Comment:            The GeneXpert MRSA Assay (FDA     approved for NASAL specimens     only), is one component of a     comprehensive MRSA colonization     surveillance program. It is not     intended to diagnose MRSA     infection nor to guide or     monitor treatment for     MRSA infections.     Studies: Mr Brain Wo Contrast  08/23/2013   CLINICAL DATA:  Alzheimer's.  Hypertension.  Dizziness.  EXAM: MRI HEAD WITHOUT CONTRAST  TECHNIQUE: Multiplanar, multiecho pulse sequences of the brain and surrounding structures were obtained without  intravenous contrast.  COMPARISON:  08/16/2013 CT.  No comparison MR.  FINDINGS: No acute infarct.  Prominent mineral deposition dentate nucleus bilaterally.  Scattered blood breakdown products suggestive of result of prior hemorrhagic ischemia.  Mild to moderate small vessel disease type changes.  Global atrophy. Ventricular prominence felt to be related to atrophy rather than hydrocephalus.  No intracranial mass lesion noted on this unenhanced exam.  Major intracranial vascular structures are patent. Atherosclerotic type changes of the vertebral arteries suspected bilaterally.  Cervical spondylotic changes with mild spinal stenosis.  Cervical medullary junction, pituitary region, pineal region and orbital structures unremarkable.  IMPRESSION: No acute infarct.  Please see above.   Electronically Signed   By: Bridgett LarssonSteve  Olson M.D.   On: 08/23/2013 19:14   Ct Abdomen Pelvis W Contrast  08/24/2013   CLINICAL DATA:  Persistent nausea and vomiting  EXAM: CT ABDOMEN AND PELVIS WITH CONTRAST  TECHNIQUE: Multidetector CT imaging of the abdomen and pelvis was performed using the standard protocol following bolus administration of intravenous contrast.  CONTRAST:  80 mL Omnipaque 300 nonionic  COMPARISON:  August 16, 2011  FINDINGS: There is patchy bibasilar atelectatic change. Heart is mildly enlarged. There is calcification in the mitral annulus. Visualized pericardium is not thickened.  There is a small hiatal hernia.  There is fatty change in the liver. No focal liver lesions are identified. Gallbladder wall is not appreciably thickened. There is no biliary duct dilatation.  Spleen, pancreas, and adrenals appear normal. There is a 9 x 7 mm cyst in the posterior lower pole right kidney. No other renal masses are identified. There is no hydronephrosis in either kidney. There is no renal or ureteral calculus on either side.  In the pelvis, the urinary bladder is midline with normal wall thickness. There is extensive sigmoid  diverticulosis without frank diverticulitis. There are also multiple diverticula in the descending colon without diverticulitis. There is no pelvic mass or fluid collection. Appendix appears normal. Uterus is absent.  There is no appreciable bowel obstruction. There is no free air or portal venous air.  There is no ascites, adenopathy, or abscess in the abdomen or pelvis. Fat is  noted in the wall of the terminal ileum without focal inflammation.  There is a minimal ventral hernia containing only fat.  There is extensive atherosclerotic change in the aorta and iliac arteries. No aneurysm is appreciable.  Bones are diffusely osteoporotic. There is extensive postoperative change in the lumbar spine. There is marked collapse of the T12 vertebral body. There is more compression at T12 than on the prior study.  IMPRESSION: Marked collapse of the T12 vertebral body, increased from prior study.  Extensive distal colonic diverticulosis without diverticulitis. No bowel obstruction. No abscess. Appendix appears normal.  Fat in the wall of the terminal ileum may be secondary to prior inflammation. No acute appearing inflammation is seen in this area. There is no fistula or surrounding mesenteric thickening.  No renal or ureteral calculus.  No hydronephrosis.  Fatty liver. Small hiatal hernia present. The bibasilar lung atelectatic change present. There is a minimal ventral hernia containing only fat.  There is extensive atherosclerotic change.   Electronically Signed   By: Bretta Bang M.D.   On: 08/24/2013 08:02   Dg Pelvis Portable  08/24/2013   CLINICAL DATA:  Right hip pain.  EXAM: PORTABLE PELVIS 1-2 VIEWS  COMPARISON:  08/14/2011  FINDINGS: Diffuse bone demineralization. Postoperative changes in the lower lumbar spine. Ankylosis of the sacroiliac joints bilaterally. No displaced fractures demonstrated in the pelvis. Left hip appears externally rotated but otherwise intact. Occult fractures may be obscured by the  rotation. Sclerosis in the proximal left femur consistent with bone infarct versus enchondroma. No significant change since prior study.  IMPRESSION: No acute bony abnormality suggested. Stable appearance of chronic changes. Rotation of the left hip.   Electronically Signed   By: Burman Nieves M.D.   On: 08/24/2013 00:39   Dg Chest Port 1 View  08/24/2013   CLINICAL DATA:  Infiltrates.  EXAM: PORTABLE CHEST - 1 VIEW  COMPARISON:  08/16/2013  FINDINGS: Shallow inspiration with elevation of the left hemidiaphragm. Normal heart size and pulmonary vascularity. Central interstitial changes likely representing chronic bronchitis. Calcification of the aorta. Calcification in the mitral valve annulus. No focal airspace disease. Suggestion of slight blunting of left costophrenic angle, possibly indicating small effusion.  IMPRESSION: Chronic bronchitic changes in the lungs. Probable small left pleural effusion. No focal consolidation.   Electronically Signed   By: Burman Nieves M.D.   On: 08/24/2013 00:51    Scheduled Meds: . ALPRAZolam  0.25 mg Oral QHS  . atorvastatin  20 mg Oral Daily  . calcium-vitamin D  1 tablet Oral BID WC  . enoxaparin (LOVENOX) injection  30 mg Subcutaneous Q24H  . feeding supplement (ENSURE COMPLETE)  237 mL Oral BID BM  . gabapentin  100 mg Oral TID  . loratadine  10 mg Oral Daily  . metoprolol tartrate  12.5 mg Oral BID  . metronidazole  500 mg Intravenous Q8H  . pantoprazole  40 mg Oral Daily  . predniSONE  2 mg Oral Q breakfast  . sertraline  25 mg Oral Daily  . sodium chloride      . traMADol  50 mg Oral TID   Continuous Infusions: . sodium chloride 75 mL/hr at 08/24/13 1210    Principal Problem:   Nausea and vomiting Active Problems:   HTN (hypertension)   Alzheimer's disease   Nausea & vomiting  Time spent:  CHIU, STEPHEN K  [facility] Pager [phone]. If 7PM-7AM, please contact night-coverage at [email or web address], password [facility] 08/24/2013,  12:55 PM  LOS:  1 day

## 2013-08-24 NOTE — ED Provider Notes (Signed)
See prior note   Ward GivensIva L Shandee Jergens, MD 08/24/13 1407

## 2013-08-24 NOTE — Clinical Social Work Placement (Signed)
Clinical Social Work Department CLINICAL SOCIAL WORK PLACEMENT NOTE 08/24/2013  Patient:  Angelica Marshall,Angelica Marshall  Account Number:  [id number] Admit date:  08/23/2013  Clinical Social Worker:  Cherre BlancJOSEPH BRYANT Cleotis Sparr, [place]  Date/time:  08/24/2013 11:03 AM  Clinical Social Work is seeking post-discharge placement for this patient at the following level of care:   SKILLED NURSING   (*CSW will update this form in Epic as items are completed)   08/24/2013  [facility]family provided with [facility]'s list of facilities offering this level of care within the geographic area requested by the patient (or if unable, by the patient's family).  08/24/2013  Patient/family informed of their freedom to choose among providers that offer the needed level of care, that participate in Medicare, Medicaid or managed care program needed by the patient, have an available bed and are willing to accept the patient.  08/24/2013  Patient/family informed of [facility]' ownership interest in [facility], as well as of the fact that they are under no obligation to receive care at this facility.  PASARR submitted to EDS on  PASARR number received on   FL2 transmitted to all facilities in geographic area requested by pt/family on  08/24/2013 FL2 transmitted to all facilities within larger geographic area on   Patient informed that his/her managed care company has contracts with or will negotiate with  certain facilities, including the following:     Patient/family informed of bed offers received:   Patient chooses bed at  Physician recommends and patient chooses bed at    Patient to be transferred to  on   Patient to be transferred to facility by  Patient and family notified of transfer on  Name of family member notified:    The following physician request were entered in Epic:   Additional Comments:    Roddie McBryant Tj Kitchings MSW, [place], [place], [phone]

## 2013-08-25 DIAGNOSIS — R5381 Other malaise: Secondary | ICD-10-CM

## 2013-08-25 DIAGNOSIS — R5383 Other fatigue: Secondary | ICD-10-CM

## 2013-08-25 LAB — BASIC METABOLIC PANEL
ANION GAP: 11 (ref 5–15)
BUN: 14 mg/dL (ref 6–23)
CO2: 26 meq/L (ref 19–32)
CREATININE: 0.72 mg/dL (ref 0.50–1.10)
Calcium: 8.3 mg/dL — ABNORMAL LOW (ref 8.4–10.5)
Chloride: 102 mEq/L (ref 96–112)
GFR calc Af Amer: 81 mL/min — ABNORMAL LOW (ref >90)
GFR calc non Af Amer: 70 mL/min — ABNORMAL LOW (ref >90)
Glucose, Bld: 90 mg/dL (ref 70–99)
POTASSIUM: 4.7 meq/L (ref 3.7–5.3)
Sodium: 139 mEq/L (ref 137–147)

## 2013-08-25 NOTE — Progress Notes (Signed)
[facility] PROGRESS NOTE  Angelica KirksRuby Marshall AVW:[id number] DOB: 1916/01/11 DOA: 08/23/2013 PCP: MAZZOCCHI, Rise MuANNMARIE, MD  Assessment/Plan: 1. Intractable n/v 1. No acute inflammatory process on ct abd 2. Per history, question n/v secondary to vertigo 3. Since scheduling meclizine, pt seems to be much improved 4. Recent stool studies neg for GI pathogen 5. Pt has been continued on flagyl 6. Afebrile. No leukocytosis 2. HTN 1. BP with sbp in the 90's while on scheduled meclizine 2. Will stop beta blocker 3. HLD 1. Cont statin 4. Dementia 1. Stable 5. DVT prophylaxis 1. lovenox  Code Status: DNR Family Communication: pt in room, son at bedside Disposition Plan: Pending  Consultants:  none  Procedures:    Antibiotics:  Flagyl 7/9>>>7/10  HPI/Subjective: No acute events noted overnight. Pt noted to be improved  Objective: Filed Vitals:   08/24/13 2236 08/25/13 0436 08/25/13 0938 08/25/13 1300  BP: 142/81 118/71 102/63 90/56  Pulse: 67 64 74 70  Temp: 98.2 F (36.8 C) 98.2 F (36.8 C)  98 F (36.7 C)  TempSrc: Oral Oral  Oral  Resp: 12 13  15   Height:      Weight:      SpO2: 94% 91%  94%    Intake/Output Summary (Last 24 hours) at 08/25/13 1418 Last data filed at 08/25/13 0900  Gross per 24 hour  Intake    150 ml  Output      0 ml  Net    150 ml   Filed Weights   08/23/13 2127  Weight: 132 lb 4.4 oz (60 kg)    Exam:   General:  Awake, in nad  Cardiovascular: regular, s1, s2  Respiratory: normal resp effort, on wheezing  Abdomen: soft, nondistended  Musculoskeletal: perfused, no clubbing   Data Reviewed: Basic Metabolic Panel:  Recent Labs Lab 08/19/13 1141 08/22/13 0525 08/23/13 0534 08/23/13 1958 08/24/13 0726 08/25/13 0518  NA 139 140  --  140 141 139  K 3.7 3.4*  --  4.2 3.6* 4.7  CL 104 105  --  102 103 102  CO2 23 25  --  22 25 26   GLUCOSE 129* 115*  --  131* 103* 90  BUN 18 12  --  10 11 14   CREATININE 0.75 0.74 0.65  0.63 0.69 0.72  CALCIUM 8.1* 8.8  --  8.9 8.4 8.3*   Liver Function Tests:  Recent Labs Lab 08/23/13 1958 08/24/13 0726  AST 16 13  ALT 7 6  ALKPHOS 59 54  BILITOT 0.5 0.4  PROT 6.5 5.7*  ALBUMIN 3.2* 2.9*    Recent Labs Lab 08/23/13 1958  LIPASE 40   No results found for this basename: AMMONIA,  in the last 168 hours CBC:  Recent Labs Lab 08/19/13 1141 08/22/13 0525 08/23/13 0534 08/23/13 1958 08/24/13 0726  WBC 7.1 6.8 7.4 7.1 7.6  NEUTROABS  --   --   --  5.5 5.5  HGB 11.7* 13.1 13.1 13.6 12.3  HCT 34.8* 39.0 38.9 40.4 37.0  MCV 93.5 92.0 93.7 92.9 93.9  PLT 180 211 190 240 236   Cardiac Enzymes:  Recent Labs Lab 08/23/13 1958  CKTOTAL 104  TROPONINI <0.30   BNP (last 3 results) No results found for this basename: PROBNP,  in the last 8760 hours CBG: No results found for this basename: GLUCAP,  in the last 168 hours  Recent Results (from the past 240 hour(s))  CLOSTRIDIUM DIFFICILE BY PCR     Status: None  Collection Time    08/19/13  9:58 PM      Result Value Ref Range Status   C difficile by pcr NEGATIVE  NEGATIVE Final   Comment: Performed at [facility]  CLOSTRIDIUM DIFFICILE BY PCR     Status: None   Collection Time    08/21/13  4:53 PM      Result Value Ref Range Status   C difficile by pcr NEGATIVE  NEGATIVE Final   Comment: Performed at [facility]  MRSA PCR SCREENING     Status: None   Collection Time    08/23/13  9:28 PM      Result Value Ref Range Status   MRSA by PCR NEGATIVE  NEGATIVE Final   Comment:            The GeneXpert MRSA Assay (FDA     approved for NASAL specimens     only), is one component of a     comprehensive MRSA colonization     surveillance program. It is not     intended to diagnose MRSA     infection nor to guide or     monitor treatment for     MRSA infections.     Studies: Mr Brain Wo Contrast  08/23/2013   CLINICAL DATA:  Alzheimer's.  Hypertension.  Dizziness.  EXAM: MRI HEAD  WITHOUT CONTRAST  TECHNIQUE: Multiplanar, multiecho pulse sequences of the brain and surrounding structures were obtained without intravenous contrast.  COMPARISON:  08/16/2013 CT.  No comparison MR.  FINDINGS: No acute infarct.  Prominent mineral deposition dentate nucleus bilaterally.  Scattered blood breakdown products suggestive of result of prior hemorrhagic ischemia.  Mild to moderate small vessel disease type changes.  Global atrophy. Ventricular prominence felt to be related to atrophy rather than hydrocephalus.  No intracranial mass lesion noted on this unenhanced exam.  Major intracranial vascular structures are patent. Atherosclerotic type changes of the vertebral arteries suspected bilaterally.  Cervical spondylotic changes with mild spinal stenosis.  Cervical medullary junction, pituitary region, pineal region and orbital structures unremarkable.  IMPRESSION: No acute infarct.  Please see above.   Electronically Signed   By: Bridgett Larsson M.D.   On: 08/23/2013 19:14   Ct Abdomen Pelvis W Contrast  08/24/2013   CLINICAL DATA:  Persistent nausea and vomiting  EXAM: CT ABDOMEN AND PELVIS WITH CONTRAST  TECHNIQUE: Multidetector CT imaging of the abdomen and pelvis was performed using the standard protocol following bolus administration of intravenous contrast.  CONTRAST:  80 mL Omnipaque 300 nonionic  COMPARISON:  August 16, 2011  FINDINGS: There is patchy bibasilar atelectatic change. Heart is mildly enlarged. There is calcification in the mitral annulus. Visualized pericardium is not thickened.  There is a small hiatal hernia.  There is fatty change in the liver. No focal liver lesions are identified. Gallbladder wall is not appreciably thickened. There is no biliary duct dilatation.  Spleen, pancreas, and adrenals appear normal. There is a 9 x 7 mm cyst in the posterior lower pole right kidney. No other renal masses are identified. There is no hydronephrosis in either kidney. There is no renal or ureteral  calculus on either side.  In the pelvis, the urinary bladder is midline with normal wall thickness. There is extensive sigmoid diverticulosis without frank diverticulitis. There are also multiple diverticula in the descending colon without diverticulitis. There is no pelvic mass or fluid collection. Appendix appears normal. Uterus is absent.  There is no appreciable bowel obstruction.  There is no free air or portal venous air.  There is no ascites, adenopathy, or abscess in the abdomen or pelvis. Fat is noted in the wall of the terminal ileum without focal inflammation.  There is a minimal ventral hernia containing only fat.  There is extensive atherosclerotic change in the aorta and iliac arteries. No aneurysm is appreciable.  Bones are diffusely osteoporotic. There is extensive postoperative change in the lumbar spine. There is marked collapse of the T12 vertebral body. There is more compression at T12 than on the prior study.  IMPRESSION: Marked collapse of the T12 vertebral body, increased from prior study.  Extensive distal colonic diverticulosis without diverticulitis. No bowel obstruction. No abscess. Appendix appears normal.  Fat in the wall of the terminal ileum may be secondary to prior inflammation. No acute appearing inflammation is seen in this area. There is no fistula or surrounding mesenteric thickening.  No renal or ureteral calculus.  No hydronephrosis.  Fatty liver. Small hiatal hernia present. The bibasilar lung atelectatic change present. There is a minimal ventral hernia containing only fat.  There is extensive atherosclerotic change.   Electronically Signed   By: Bretta Bang M.D.   On: 08/24/2013 08:02   Dg Pelvis Portable  08/24/2013   CLINICAL DATA:  Right hip pain.  EXAM: PORTABLE PELVIS 1-2 VIEWS  COMPARISON:  08/14/2011  FINDINGS: Diffuse bone demineralization. Postoperative changes in the lower lumbar spine. Ankylosis of the sacroiliac joints bilaterally. No displaced fractures  demonstrated in the pelvis. Left hip appears externally rotated but otherwise intact. Occult fractures may be obscured by the rotation. Sclerosis in the proximal left femur consistent with bone infarct versus enchondroma. No significant change since prior study.  IMPRESSION: No acute bony abnormality suggested. Stable appearance of chronic changes. Rotation of the left hip.   Electronically Signed   By: Burman Nieves M.D.   On: 08/24/2013 00:39   Dg Chest Port 1 View  08/24/2013   CLINICAL DATA:  Infiltrates.  EXAM: PORTABLE CHEST - 1 VIEW  COMPARISON:  08/16/2013  FINDINGS: Shallow inspiration with elevation of the left hemidiaphragm. Normal heart size and pulmonary vascularity. Central interstitial changes likely representing chronic bronchitis. Calcification of the aorta. Calcification in the mitral valve annulus. No focal airspace disease. Suggestion of slight blunting of left costophrenic angle, possibly indicating small effusion.  IMPRESSION: Chronic bronchitic changes in the lungs. Probable small left pleural effusion. No focal consolidation.   Electronically Signed   By: Burman Nieves M.D.   On: 08/24/2013 00:51    Scheduled Meds: . ALPRAZolam  0.25 mg Oral QHS  . atorvastatin  20 mg Oral Daily  . calcium-vitamin D  1 tablet Oral BID WC  . enoxaparin (LOVENOX) injection  30 mg Subcutaneous Q24H  . feeding supplement (ENSURE COMPLETE)  237 mL Oral BID BM  . gabapentin  100 mg Oral TID  . loratadine  10 mg Oral Daily  . meclizine  12.5 mg Oral TID  . metoprolol tartrate  12.5 mg Oral BID  . pantoprazole  40 mg Oral Daily  . predniSONE  2 mg Oral Q breakfast  . sertraline  25 mg Oral Daily  . traMADol  50 mg Oral TID   Continuous Infusions:    Principal Problem:   Nausea and vomiting Active Problems:   HTN (hypertension)   Alzheimer's disease   Nausea & vomiting  Time spent:  CHIU, STEPHEN K  [facility] Pager [phone]. If 7PM-7AM, please contact  night-coverage at  [email or web address], password [facility] 08/25/2013, 2:18 PM  LOS: 2 days

## 2013-08-26 MED ORDER — NEOMYCIN-POLYMYXIN-DEXAMETH 3.5-10000-0.1 OP SUSP
2.0000 [drp] | OPHTHALMIC | Status: DC
  Administered 2013-08-26 – 2013-08-27 (×6): 2 [drp] via OPHTHALMIC
  Filled 2013-08-26: qty 5

## 2013-08-26 MED ORDER — MECLIZINE HCL 25 MG PO TABS
25.0000 mg | ORAL_TABLET | Freq: Three times a day (TID) | ORAL | Status: DC
  Filled 2013-08-26 (×2): qty 1

## 2013-08-26 MED ORDER — MECLIZINE HCL 12.5 MG PO TABS
12.5000 mg | ORAL_TABLET | Freq: Three times a day (TID) | ORAL | Status: DC
  Administered 2013-08-26 – 2013-08-27 (×3): 12.5 mg via ORAL
  Filled 2013-08-26 (×4): qty 1

## 2013-08-26 NOTE — Clinical Social Work Note (Signed)
CSW continues to follow this patient for d/c planning needs. CSW contacted patient's son [name]([place]) to provide available bed offers. CSW left message for Luther ParodySidney to follow-up.  Doraine Schexnider Patrick-[place], LCSWA Weekend Clinical Social Worker [phone]

## 2013-08-26 NOTE — Progress Notes (Signed)
[facility] PROGRESS NOTE  Angelica KirksRuby Marshall ZOX:[id number] DOB: [birth year] DOA: 08/23/2013 PCP: MAZZOCCHI, Rise MuANNMARIE, MD  Assessment/Plan: 1. Intractable n/v 1. No acute inflammatory process on ct abd 2. Per history, suspect n/v secondary to vertigo 3. On scheduled meclizine, pt seems to be much improved since admission 4. Recent stool studies neg for GI pathogen 5. Flagyl had been d/c'd  6. Remains afebrile and without leukocytosis 2. HTN 1. SBP noted to be in the 90's while on scheduled meclizine 2. BP improved since stopping beta blocker 3. HLD 1. Cont statin 4. Dementia 1. Stable 5. DVT prophylaxis 1. lovenox  Code Status: DNR Family Communication: pt in room Disposition Plan: Pending  Consultants:  none  Procedures:    Antibiotics:  Flagyl 7/9>>>7/10  HPI/Subjective: Continuing to be improved  Objective: Filed Vitals:   08/25/13 0938 08/25/13 1300 08/25/13 2148 08/26/13 0438  BP: 102/63 90/56 143/84 115/70  Pulse: 74 70 77 70  Temp:  98 F (36.7 C) 98.4 F (36.9 C) 98.8 F (37.1 C)  TempSrc:  Oral Oral Oral  Resp:  15 15 15   Height:      Weight:      SpO2:  94% 93% 93%    Intake/Output Summary (Last 24 hours) at 08/26/13 1240 Last data filed at 08/26/13 [id number]  Gross per 24 hour  Intake    100 ml  Output      0 ml  Net    100 ml   Filed Weights   08/23/13 2127  Weight: 132 lb 4.4 oz (60 kg)    Exam:   General:  Awake, in no acute distress  Cardiovascular: regular, s1, s2  Respiratory: normal resp effort, no crackles  Abdomen: soft, nondistended  Musculoskeletal: perfused, no clubbing, no cyanosis  Data Reviewed: Basic Metabolic Panel:  Recent Labs Lab 08/22/13 0525 08/23/13 0534 08/23/13 1958 08/24/13 0726 08/25/13 0518  NA 140  --  140 141 139  K 3.4*  --  4.2 3.6* 4.7  CL 105  --  102 103 102  CO2 25  --  22 25 26   GLUCOSE 115*  --  131* 103* 90  BUN 12  --  10 11 14   CREATININE 0.74 0.65 0.63 0.69 0.72  CALCIUM  8.8  --  8.9 8.4 8.3*   Liver Function Tests:  Recent Labs Lab 08/23/13 1958 08/24/13 0726  AST 16 13  ALT 7 6  ALKPHOS 59 54  BILITOT 0.5 0.4  PROT 6.5 5.7*  ALBUMIN 3.2* 2.9*    Recent Labs Lab 08/23/13 1958  LIPASE 40   No results found for this basename: AMMONIA,  in the last 168 hours CBC:  Recent Labs Lab 08/22/13 0525 08/23/13 0534 08/23/13 1958 08/24/13 0726  WBC 6.8 7.4 7.1 7.6  NEUTROABS  --   --  5.5 5.5  HGB 13.1 13.1 13.6 12.3  HCT 39.0 38.9 40.4 37.0  MCV 92.0 93.7 92.9 93.9  PLT 211 190 240 236   Cardiac Enzymes:  Recent Labs Lab 08/23/13 1958  CKTOTAL 104  TROPONINI <0.30   BNP (last 3 results) No results found for this basename: PROBNP,  in the last 8760 hours CBG: No results found for this basename: GLUCAP,  in the last 168 hours  Recent Results (from the past 240 hour(s))  CLOSTRIDIUM DIFFICILE BY PCR     Status: None   Collection Time    08/19/13  9:58 PM      Result Value Ref Range Status  C difficile by pcr NEGATIVE  NEGATIVE Final   Comment: Performed at [facility]  CLOSTRIDIUM DIFFICILE BY PCR     Status: None   Collection Time    08/21/13  4:53 PM      Result Value Ref Range Status   C difficile by pcr NEGATIVE  NEGATIVE Final   Comment: Performed at [facility]  MRSA PCR SCREENING     Status: None   Collection Time    08/23/13  9:28 PM      Result Value Ref Range Status   MRSA by PCR NEGATIVE  NEGATIVE Final   Comment:            The GeneXpert MRSA Assay (FDA     approved for NASAL specimens     only), is one component of a     comprehensive MRSA colonization     surveillance program. It is not     intended to diagnose MRSA     infection nor to guide or     monitor treatment for     MRSA infections.     Studies: No results found.  Scheduled Meds: . ALPRAZolam  0.25 mg Oral QHS  . atorvastatin  20 mg Oral Daily  . calcium-vitamin D  1 tablet Oral BID WC  . enoxaparin (LOVENOX) injection   30 mg Subcutaneous Q24H  . feeding supplement (ENSURE COMPLETE)  237 mL Oral BID BM  . gabapentin  100 mg Oral TID  . loratadine  10 mg Oral Daily  . meclizine  12.5 mg Oral TID  . pantoprazole  40 mg Oral Daily  . predniSONE  2 mg Oral Q breakfast  . sertraline  25 mg Oral Daily  . traMADol  50 mg Oral TID   Continuous Infusions:    Principal Problem:   Nausea and vomiting Active Problems:   HTN (hypertension)   Alzheimer's disease   Nausea & vomiting  Time spent:  CHIU, STEPHEN K  [facility] Pager [phone]. If 7PM-7AM, please contact night-coverage at [email or web address], password [facility] 08/26/2013, 12:40 PM  LOS: 3 days

## 2013-08-27 LAB — BASIC METABOLIC PANEL
Anion gap: 12 (ref 5–15)
BUN: 15 mg/dL (ref 6–23)
CHLORIDE: 101 meq/L (ref 96–112)
CO2: 28 mEq/L (ref 19–32)
Calcium: 8.7 mg/dL (ref 8.4–10.5)
Creatinine, Ser: 0.75 mg/dL (ref 0.50–1.10)
GFR calc Af Amer: 80 mL/min — ABNORMAL LOW (ref >90)
GFR calc non Af Amer: 69 mL/min — ABNORMAL LOW (ref >90)
GLUCOSE: 84 mg/dL (ref 70–99)
Potassium: 3.8 mEq/L (ref 3.7–5.3)
SODIUM: 141 meq/L (ref 137–147)

## 2013-08-27 LAB — CBC
HEMATOCRIT: 40.9 % (ref 36.0–46.0)
HEMOGLOBIN: 13.8 g/dL (ref 12.0–15.0)
MCH: 31.8 pg (ref 26.0–34.0)
MCHC: 33.7 g/dL (ref 30.0–36.0)
MCV: 94.2 fL (ref 78.0–100.0)
Platelets: 216 10*3/uL (ref 150–400)
RBC: 4.34 MIL/uL (ref 3.87–5.11)
RDW: 14.8 % (ref 11.5–15.5)
WBC: 7 10*3/uL (ref 4.0–10.5)

## 2013-08-27 MED ORDER — ALPRAZOLAM 0.25 MG PO TABS: 0.2500 mg | ORAL_TABLET | Freq: Every day | ORAL | Status: AC

## 2013-08-27 MED ORDER — MECLIZINE HCL 12.5 MG PO TABS: 12.5000 mg | ORAL_TABLET | Freq: Three times a day (TID) | ORAL | Status: AC

## 2013-08-27 MED ORDER — TRAMADOL HCL 50 MG PO TABS: 50.0000 mg | ORAL_TABLET | Freq: Three times a day (TID) | ORAL | Status: AC

## 2013-08-27 NOTE — Progress Notes (Signed)
Physical Therapy Treatment Patient Details Name: Angelica Marshall MRN: [id number] DOB: [birth year] Today's Date: 08/27/2013    History of Present Illness Angelica Marshall is a(n) 80 y.o. female who presents emergency department 08/23/13 via EMS for nausea, vomiting and symptoms of vertigo. The patient was admitted to [facility] as 08/16/2013 for the same symptoms. The patient was found to have an elevated lactate and elevated white count making her SIRS positive. No actual source was found. The patient was discharged 08/23/13 from [facility] and return to her assisted living facility. When she arrived at her assisted living facility she was complaining of the room spinning around her, nausea and vomiting. She was returned to the ED. PMH includeds  HTN, dementia, depression    PT Comments    Pt progressing and able to ambulate today with 2 person assist for safety and stability. Pt continues to report vague dizziness with mobility with slight increase in standing but pt not able to state if sensation of spinning vs lightheadedness and does not appear positional as BPPV would. Pt may have a vestibular dysfunction requiring habituation and will continue to assess. Pt encouraged to continue OOB with nursing and HEP. Will follow and educated for gaze stabilization and balance.   Follow Up Recommendations  SNF;Supervision/Assistance - 24 hour     Equipment Recommendations       Recommendations for Other Services       Precautions / Restrictions Precautions Precautions: Fall Precaution Comments: Hx Alzheimers/dementia    Mobility  Bed Mobility Overal bed mobility: Needs Assistance Bed Mobility: Supine to Sit     Supine to sit: Min assist     General bed mobility comments: cues for sequence with use of rail and assist to fully elevate trunk and scoot to EOB  Transfers Overall transfer level: Needs assistance   Transfers: Sit to/from Stand Sit to Stand: Mod assist         General transfer  comment: assist for anterior translation, hand placement and balance  Ambulation/Gait Ambulation/Gait assistance: Mod assist;+2 safety/equipment Ambulation Distance (Feet): 15 Feet Assistive device: Rolling walker (2 wheeled) Gait Pattern/deviations: Narrow base of support;Antalgic;Leaning posteriorly;Trunk flexed   Gait velocity interpretation: <1.8 ft/sec, indicative of risk for recurrent falls General Gait Details: cues throughout for posture, position in RW and sequence. pt with left lateral lean with flexed posture and periodic jerking of limbs in standing particularly with excitement when son entered room with cues for slow steady steps and for breathing to calm pt   Stairs            Wheelchair Mobility    Modified Rankin (Stroke Patients Only)       Balance Overall balance assessment: Needs assistance   Sitting balance-Leahy Scale: Fair       Standing balance-Leahy Scale: Poor                      Cognition Arousal/Alertness: Awake/alert Behavior During Therapy: Flat affect Overall Cognitive Status: Impaired/Different from baseline Area of Impairment: Safety/judgement;Problem solving     Memory: Decreased short-term memory   Safety/Judgement: Decreased awareness of deficits   Problem Solving: Slow processing;Difficulty sequencing;Requires verbal cues;Requires tactile cues      Exercises General Exercises - Lower Extremity Long Arc Quad: AROM;Seated;Both;15 reps Hip Flexion/Marching: AROM;Seated;Both;15 reps    General Comments        Pertinent Vitals/Pain No pain BP 127/81 in sitting, 109/72 in standing    Home Living  Prior Function            PT Goals (current goals can now be found in the care plan section) Progress towards PT goals: Progressing toward goals    Frequency       PT Plan Current plan remains appropriate    Co-evaluation             End of Session Equipment Utilized  During Treatment: Gait belt Activity Tolerance: Patient limited by fatigue Patient left: in chair;with call bell/phone within reach;with family/visitor present;with chair alarm set     Time: [id number] PT Time Calculation (min): 29 min  Charges:  $Gait Training: 8-22 mins $Therapeutic Exercise: 8-22 mins                    G Codes:      Delorse Lekabor, Bran Aldridge Beth 08/27/2013, 10:32 AM Delaney MeigsMaija Tabor Brigida Scotti, PT [phone]

## 2013-08-27 NOTE — Discharge Summary (Addendum)
Physician Discharge Summary  Corby Villasenor ZOX:[id number] DOB: [birth year] DOA: 08/23/2013  PCP: Talmadge Coventry, MD  Admit date: 08/23/2013 Discharge date: 08/27/2013  Time spent: 35 minutes  Recommendations for Outpatient Follow-up:  1. Follow up with PCP in 1-2 weeks  Discharge Diagnoses:  Principal Problem:   Nausea and vomiting Active Problems:   HTN (hypertension)   Alzheimer's disease   Nausea & vomiting   Discharge Condition: Improved  Diet recommendation: Soft  Filed Weights   08/23/13 2127  Weight: 132 lb 4.4 oz (60 kg)    History of present illness:  Please see h and p from 08/23/13. Briefly, pt is a 80yo female who presens with nausea and vomiting. Pt is a poor historian, however per history, pt noted to have dizziness with head movements, improved with meclizine. Pt admitted for further work up.  Hospital Course:  1. Intractable n/v  1. No acute inflammatory process on ct abd 2. Per history, suspect n/v secondary to vertigo 3. Pt was initially on PRN dosed meclizine, however given pt's advanced dementia, it was felt pt was not able to communicate when she needed meds. 4. Meclizine therefore scheduled, pt since noted to be much improved 5. Recent stool studies neg for GI pathogen 6. Flagyl had been d/c'd as CT was neg for inflammatory process 7. Remains afebrile and without leukocytosis 2. HTN  1. SBP noted to be in the 90's while on scheduled meclizine 2. BP improved since stopping beta blocker 3. HLD  1. Cont statin 4. Dementia  1. Stable 5. DVT prophylaxis  1. lovenox Consultations:  PT/OT  Discharge Exam: Filed Vitals:   08/26/13 1256 08/26/13 2034 08/27/13 0449 08/27/13 1028  BP: 122/82 126/76 144/86 109/72  Pulse: 79 81 78   Temp: 98.9 F (37.2 C) 98.5 F (36.9 C) 97.8 F (36.6 C)   TempSrc: Oral Oral Oral   Resp: 16 16 16    Height:      Weight:      SpO2: 95% 95% 98%     General: awake, in nad Cardiovascular: regular, s1,  s2 Respiratory: normal resp effort, no wheezing  Discharge Instructions     Medication List    STOP taking these medications       metoprolol tartrate 25 MG tablet  Commonly known as:  LOPRESSOR      TAKE these medications       acetaminophen 500 MG tablet  Commonly known as:  TYLENOL  Take 500 mg by mouth 3 (three) times daily.     ALPRAZolam 0.25 MG tablet  Commonly known as:  XANAX  Take 0.25 mg by mouth See admin instructions. Take 0.25mg  twice daily as needed for anxiety but take 0.25mg  daily at bedtime     atorvastatin 20 MG tablet  Commonly known as:  LIPITOR  Take 20 mg by mouth daily.     calcium-vitamin D 500-200 MG-UNIT per tablet  Commonly known as:  OSCAL WITH D  Take 1 tablet by mouth 2 (two) times daily.     Cranberry 475 MG Caps  Take 475 mg by mouth 2 (two) times daily.     ENSURE PLUS Liqd  Take 237 mLs by mouth 2 (two) times daily.     esomeprazole 40 MG capsule  Commonly known as:  NEXIUM  Take 40 mg by mouth daily before breakfast.     gabapentin 100 MG capsule  Commonly known as:  NEURONTIN  Take 100 mg by mouth 3 (three) times daily.  loperamide 2 MG capsule  Commonly known as:  IMODIUM  Take 2 mg by mouth 4 (four) times daily as needed for diarrhea or loose stools.     loratadine 10 MG tablet  Commonly known as:  CLARITIN  Take 10 mg by mouth daily.     meclizine 12.5 MG tablet  Commonly known as:  ANTIVERT  Take 1 tablet (12.5 mg total) by mouth 3 (three) times daily.     Melatonin 3 MG Tabs  Take 3 mg by mouth at bedtime.     nitrofurantoin (macrocrystal-monohydrate) 100 MG capsule  Commonly known as:  MACROBID  Take 100 mg by mouth daily.     ondansetron 4 MG tablet  Commonly known as:  ZOFRAN  Take 4 mg by mouth every 6 (six) hours as needed for nausea.     predniSONE 1 MG tablet  Commonly known as:  DELTASONE  Take 2 mg by mouth daily with breakfast.     senna 8.6 MG Tabs tablet  Commonly known as:  SENOKOT   Take 1 tablet by mouth 2 (two) times daily.     sertraline 50 MG tablet  Commonly known as:  ZOLOFT  Take 25 mg by mouth daily.     simethicone 80 MG chewable tablet  Commonly known as:  MYLICON  Chew 80 mg by mouth every 6 (six) hours as needed for flatulence.     traMADol 50 MG tablet  Commonly known as:  ULTRAM  Take 1 tablet (50 mg total) by mouth 3 (three) times daily.       Allergies  Allergen Reactions  . Ciprofloxacin     Unknown   Follow-up Information   Follow up with MAZZOCCHI, Rise Mu, MD. Schedule an appointment as soon as possible for a visit in 1 week.   Specialty:  Family Medicine   Contact information:   [address] Laurell Joseph[address] [place] [address] [phone]        The results of significant diagnostics from this hospitalization (including imaging, microbiology, ancillary and laboratory) are listed below for reference.    Significant Diagnostic Studies: Dg Chest 2 View  08/16/2013   CLINICAL DATA:  htn  EXAM: CHEST  2 VIEW  COMPARISON:  Two-view chest 06/11/2012  FINDINGS: Low lung volumes. Cardiac silhouette stable. Tortuous ectatic aorta containing atherosclerotic calcifications. Mild prominence of interstitial markings. The lungs are otherwise clear. Degenerative changes within the shoulders.  IMPRESSION: Chronic bronchitic changes without acute cardiopulmonary disease.   Electronically Signed   By: Salome Holmes M.D.   On: 08/16/2013 13:22   Ct Head Wo Contrast  08/16/2013   CLINICAL DATA:  Nausea, vomiting, status post fall 2 weeks ago.  EXAM: CT HEAD WITHOUT CONTRAST  TECHNIQUE: Contiguous axial images were obtained from the base of the skull through the vertex without intravenous contrast.  COMPARISON:  June 11, 2012  FINDINGS: There is chronic diffuse atrophy. Chronic bilateral periventricular white matter small vessel ischemic changes identified. There is no midline shift, hydrocephalus, or mass. No acute hemorrhage or acute transcortical  infarct is identified. The bony calvarium is intact. The visualized sinuses are clear.  IMPRESSION: No focal acute intracranial abnormality identified. Chronic diffuse atrophy. Chronic bilateral periventricular white matter small vessel ischemic change.   Electronically Signed   By: Sherian Rein M.D.   On: 08/16/2013 13:20   Mr Brain Wo Contrast  08/23/2013   CLINICAL DATA:  Alzheimer's.  Hypertension.  Dizziness.  EXAM: MRI HEAD WITHOUT CONTRAST  TECHNIQUE: Multiplanar, multiecho pulse sequences of the brain and surrounding structures were obtained without intravenous contrast.  COMPARISON:  08/16/2013 CT.  No comparison MR.  FINDINGS: No acute infarct.  Prominent mineral deposition dentate nucleus bilaterally.  Scattered blood breakdown products suggestive of result of prior hemorrhagic ischemia.  Mild to moderate small vessel disease type changes.  Global atrophy. Ventricular prominence felt to be related to atrophy rather than hydrocephalus.  No intracranial mass lesion noted on this unenhanced exam.  Major intracranial vascular structures are patent. Atherosclerotic type changes of the vertebral arteries suspected bilaterally.  Cervical spondylotic changes with mild spinal stenosis.  Cervical medullary junction, pituitary region, pineal region and orbital structures unremarkable.  IMPRESSION: No acute infarct.  Please see above.   Electronically Signed   By: Bridgett Larsson M.D.   On: 08/23/2013 19:14   Ct Abdomen Pelvis W Contrast  08/24/2013   CLINICAL DATA:  Persistent nausea and vomiting  EXAM: CT ABDOMEN AND PELVIS WITH CONTRAST  TECHNIQUE: Multidetector CT imaging of the abdomen and pelvis was performed using the standard protocol following bolus administration of intravenous contrast.  CONTRAST:  80 mL Omnipaque 300 nonionic  COMPARISON:  August 16, 2011  FINDINGS: There is patchy bibasilar atelectatic change. Heart is mildly enlarged. There is calcification in the mitral annulus. Visualized pericardium  is not thickened.  There is a small hiatal hernia.  There is fatty change in the liver. No focal liver lesions are identified. Gallbladder wall is not appreciably thickened. There is no biliary duct dilatation.  Spleen, pancreas, and adrenals appear normal. There is a 9 x 7 mm cyst in the posterior lower pole right kidney. No other renal masses are identified. There is no hydronephrosis in either kidney. There is no renal or ureteral calculus on either side.  In the pelvis, the urinary bladder is midline with normal wall thickness. There is extensive sigmoid diverticulosis without frank diverticulitis. There are also multiple diverticula in the descending colon without diverticulitis. There is no pelvic mass or fluid collection. Appendix appears normal. Uterus is absent.  There is no appreciable bowel obstruction. There is no free air or portal venous air.  There is no ascites, adenopathy, or abscess in the abdomen or pelvis. Fat is noted in the wall of the terminal ileum without focal inflammation.  There is a minimal ventral hernia containing only fat.  There is extensive atherosclerotic change in the aorta and iliac arteries. No aneurysm is appreciable.  Bones are diffusely osteoporotic. There is extensive postoperative change in the lumbar spine. There is marked collapse of the T12 vertebral body. There is more compression at T12 than on the prior study.  IMPRESSION: Marked collapse of the T12 vertebral body, increased from prior study.  Extensive distal colonic diverticulosis without diverticulitis. No bowel obstruction. No abscess. Appendix appears normal.  Fat in the wall of the terminal ileum may be secondary to prior inflammation. No acute appearing inflammation is seen in this area. There is no fistula or surrounding mesenteric thickening.  No renal or ureteral calculus.  No hydronephrosis.  Fatty liver. Small hiatal hernia present. The bibasilar lung atelectatic change present. There is a minimal ventral  hernia containing only fat.  There is extensive atherosclerotic change.   Electronically Signed   By: Bretta Bang M.D.   On: 08/24/2013 08:02   Dg Pelvis Portable  08/24/2013   CLINICAL DATA:  Right hip pain.  EXAM: PORTABLE PELVIS 1-2 VIEWS  COMPARISON:  08/14/2011  FINDINGS: Diffuse bone demineralization.  Postoperative changes in the lower lumbar spine. Ankylosis of the sacroiliac joints bilaterally. No displaced fractures demonstrated in the pelvis. Left hip appears externally rotated but otherwise intact. Occult fractures may be obscured by the rotation. Sclerosis in the proximal left femur consistent with bone infarct versus enchondroma. No significant change since prior study.  IMPRESSION: No acute bony abnormality suggested. Stable appearance of chronic changes. Rotation of the left hip.   Electronically Signed   By: Burman Nieves M.D.   On: 08/24/2013 00:39   Dg Chest Port 1 View  08/24/2013   CLINICAL DATA:  Infiltrates.  EXAM: PORTABLE CHEST - 1 VIEW  COMPARISON:  08/16/2013  FINDINGS: Shallow inspiration with elevation of the left hemidiaphragm. Normal heart size and pulmonary vascularity. Central interstitial changes likely representing chronic bronchitis. Calcification of the aorta. Calcification in the mitral valve annulus. No focal airspace disease. Suggestion of slight blunting of left costophrenic angle, possibly indicating small effusion.  IMPRESSION: Chronic bronchitic changes in the lungs. Probable small left pleural effusion. No focal consolidation.   Electronically Signed   By: Burman Nieves M.D.   On: 08/24/2013 00:51   Dg Abd 2 Views  08/17/2013   CLINICAL DATA:  Nausea, vomiting, diarrhea  EXAM: ABDOMEN - 2 VIEW  COMPARISON:  None.  FINDINGS: The bowel gas pattern is normal. There is no evidence of free air. The pattern of mottled lucency over the distal colon is consistent with colonic diverticula. There is no definitive urolithiasis. The sacroiliac joints are fused.  There is been L3-4 and L4-5 discectomy with rod and pedicle screw fixation. No evidence of hardware compromise.  IMPRESSION: No evidence of bowel obstruction or perforation.   Electronically Signed   By: Tiburcio Pea M.D.   On: 08/17/2013 05:31    Microbiology: Recent Results (from the past 240 hour(s))  CLOSTRIDIUM DIFFICILE BY PCR     Status: None   Collection Time    08/19/13  9:58 PM      Result Value Ref Range Status   C difficile by pcr NEGATIVE  NEGATIVE Final   Comment: Performed at [facility]  CLOSTRIDIUM DIFFICILE BY PCR     Status: None   Collection Time    08/21/13  4:53 PM      Result Value Ref Range Status   C difficile by pcr NEGATIVE  NEGATIVE Final   Comment: Performed at [facility]  MRSA PCR SCREENING     Status: None   Collection Time    08/23/13  9:28 PM      Result Value Ref Range Status   MRSA by PCR NEGATIVE  NEGATIVE Final   Comment:            The GeneXpert MRSA Assay (FDA     approved for NASAL specimens     only), is one component of a     comprehensive MRSA colonization     surveillance program. It is not     intended to diagnose MRSA     infection nor to guide or     monitor treatment for     MRSA infections.     Labs: Basic Metabolic Panel:  Recent Labs Lab 08/22/13 0525 08/23/13 0534 08/23/13 1958 08/24/13 0726 08/25/13 0518 08/27/13 0835  NA 140  --  140 141 139 141  K 3.4*  --  4.2 3.6* 4.7 3.8  CL 105  --  102 103 102 101  CO2 25  --  22 25 26  28  GLUCOSE 115*  --  131* 103* 90 84  BUN 12  --  10 11 14 15   CREATININE 0.74 0.65 0.63 0.69 0.72 0.75  CALCIUM 8.8  --  8.9 8.4 8.3* 8.7   Liver Function Tests:  Recent Labs Lab 08/23/13 1958 08/24/13 0726  AST 16 13  ALT 7 6  ALKPHOS 59 54  BILITOT 0.5 0.4  PROT 6.5 5.7*  ALBUMIN 3.2* 2.9*    Recent Labs Lab 08/23/13 1958  LIPASE 40   No results found for this basename: AMMONIA,  in the last 168 hours CBC:  Recent Labs Lab 08/22/13 0525  08/23/13 0534 08/23/13 1958 08/24/13 0726 08/27/13 0835  WBC 6.8 7.4 7.1 7.6 7.0  NEUTROABS  --   --  5.5 5.5  --   HGB 13.1 13.1 13.6 12.3 13.8  HCT 39.0 38.9 40.4 37.0 40.9  MCV 92.0 93.7 92.9 93.9 94.2  PLT 211 190 240 236 216   Cardiac Enzymes:  Recent Labs Lab 08/23/13 1958  CKTOTAL 104  TROPONINI <0.30   BNP: BNP (last 3 results) No results found for this basename: PROBNP,  in the last 8760 hours CBG: No results found for this basename: GLUCAP,  in the last 168 hours  Signed:  CHIU, STEPHEN K  [facility] 08/27/2013, 10:53 AM

## 2013-08-27 NOTE — Clinical Social Work Placement (Signed)
Clinical Social Work Department CLINICAL SOCIAL WORK PLACEMENT NOTE 08/27/2013  Patient:  Angelica Marshall,Angelica Marshall  Account Number:  [id number] Admit date:  08/23/2013  Clinical Social Worker:  Lavell LusterJOSEPH BRYANT Jentzen Minasyan, LCSWA  Date/time:  08/24/2013 11:03 AM  Clinical Social Work is seeking post-discharge placement for this patient at the following level of care:   SKILLED NURSING   (*CSW will update this form in Epic as items are completed)   08/24/2013  Patient/family provided with [facility] Department of Clinical Social Work's list of facilities offering this level of care within the geographic area requested by the patient (or if unable, by the patient's family).  08/24/2013  Patient/family informed of their freedom to choose among providers that offer the needed level of care, that participate in Medicare, Medicaid or managed care program needed by the patient, have an available bed and are willing to accept the patient.  08/24/2013  Patient/family informed of [facility]' ownership interest in [facility], as well as of the fact that they are under no obligation to receive care at this facility.  PASARR submitted to EDS on  PASARR number received on   FL2 transmitted to all facilities in geographic area requested by pt/family on  08/24/2013 FL2 transmitted to all facilities within larger geographic area on   Patient informed that his/her managed care company has contracts with or will negotiate with  certain facilities, including the following:     Patient/family informed of bed offers received:  08/27/2013 [facility] Physician recommends and patient chooses bed at    [facility] on  08/27/2013 Patient to be transferred to facility by Ambulance Patient and family notified of transfer on 08/27/2013 Name of family member notified:  Luther ParodySidney  The following physician request were entered in  Epic:   Additional Comments:    Roddie McBryant Tylan Briguglio MSW, [place]CSWA, [place], [phone]

## 2013-08-27 NOTE — Clinical Social Work Placement (Signed)
Clinical Social Work Department CLINICAL SOCIAL WORK PLACEMENT NOTE 08/27/2013  Patient:  Saunders RevelSIZEMORE,ETHEL  Account Number:  [id number] Admit date:  08/22/2013  Clinical Social Worker:  Cherre BlancJOSEPH BRYANT Daquann Merriott, [place]WA  Date/time:  08/23/2013 03:16 PM  Clinical Social Work is seeking post-discharge placement for this patient at the following level of care:   SKILLED NURSING   (*CSW will update this form in Epic as items are completed)   08/23/2013  Patient/family provided with [facility] Department of Clinical Social Work's list of facilities offering this level of care within the geographic area requested by the patient (or if unable, by the patient's family).  08/23/2013  Patient/family informed of their freedom to choose among providers that offer the needed level of care, that participate in Medicare, Medicaid or managed care program needed by the patient, have an available bed and are willing to accept the patient.  08/23/2013  Patient/family informed of [facility] ownership interest in [facility], as well as of the fact that they are under no obligation to receive care at this facility.  PASARR submitted to EDS on  PASARR number received on   FL2 transmitted to all facilities in geographic area requested by pt/family on  08/23/2013 FL2 transmitted to all facilities within larger geographic area on   Patient informed that his/her managed care company has contracts with or will negotiate with  certain facilities, including the following:     Patient/family informed of bed offers received:  08/24/2013 [facility] Physician recommends and patient chooses bed at    [facility]  08/27/2013 Patient to be transferred to facility by Ambulance Patient and family notified of transfer on 08/27/2013 Name of family member notified:  Lorraine LaxPauly  The following physician request were entered in Epic:   Additional  Comments: PEr MD patient [facility]. Rn given number for report. DC packet on chart. Family and facility aware of DC. Ambulance transport requested. CSW signing off at this time.    Roddie McBryant Arena Lindahl MSW, [place]CSWA, [place], [phone]

## 2013-08-27 NOTE — Progress Notes (Signed)
Patient discharge to [facility] via ambulance. Patient's Son present and accompanied patient out of the hospital.

## 2013-08-27 NOTE — Clinical Social Work Note (Signed)
Per MD [facility] RN, patient's son, and facility aware of DC. RN given number for report. DC packet on chart. Ambulance transport requested for 1:00pm.   Roddie McBryant Adison Jerger MSW, [place]CS[place], [place], [phone]

## 2013-08-27 NOTE — Care Management Note (Signed)
    Page 1 of 1   08/27/2013     12:14:33 PM CARE MANAGEMENT NOTE 08/27/2013  Patient:  Angelica Marshall,Angelica Marshall   Account Number:  [id number]  Date Initiated:  08/27/2013  Documentation initiated by:  Letha CapeAYLOR,Montreal Steidle  Subjective/Objective Assessment:   dx n/v  admit- from ALF     Action/Plan:   pt eval- rec snf   Anticipated DC Date:  08/27/2013   Anticipated DC Plan:  SKILLED NURSING FACILITY  In-house referral  Clinical Social Worker      DC Planning Services  CM consult      Choice offered to / List presented to:             Status of service:  Completed, signed off Medicare Important Message given?  YES (If response is "NO", the following Medicare IM given date fields will be blank) Date Medicare IM given:  08/27/2013 Medicare IM given by:  Letha CapeAYLOR,Vallarie Fei Date Additional Medicare IM given:   Additional Medicare IM given by:    Discharge Disposition:  SKILLED NURSING FACILITY  Per UR Regulation:  Reviewed for med. necessity/level of care/duration of stay  If discussed at Long Length of Stay Meetings, dates discussed:    Comments:  08/27/13 1213 Letha Capeeborah Kariya Lavergne RN, BSN [phone] [facility].

## 2014-04-16 DEATH — deceased

## 2014-07-31 IMAGING — CT CT HEAD W/O CM
2 series · 16 of 30 positions shown, 19 images · non-contrast
Comparison: 08/14/2011.

CLINICAL DATA: Unwitnessed fall, hitting head.  History of
dementia.  No loss of consciousness.

CT HEAD WITHOUT CONTRAST
TECHNIQUE: Contiguous axial images were obtained from the base of
the skull through the vertex without contrast.

[Series 2: head w/o · axial · non-contrast · 0.43mm/px · z∈[-296,-186]mm · 9 of 28 slices shown, 12 images]
[im 3/28  brain]
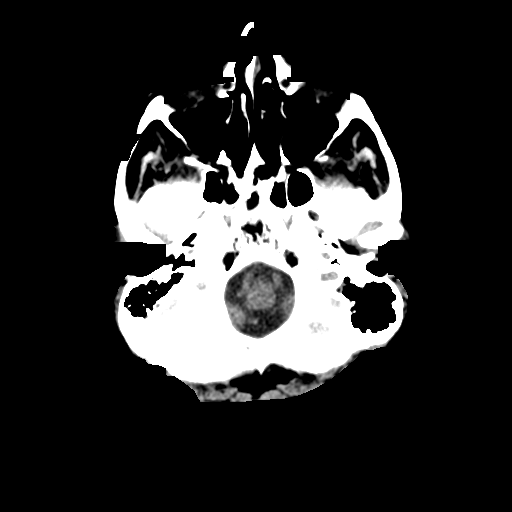
[im 3/28  bone]
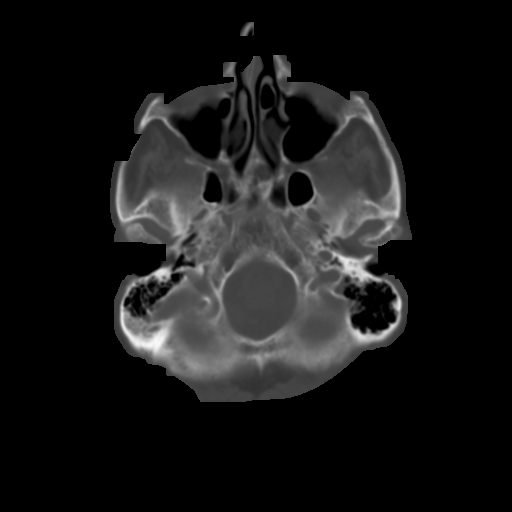
[im 6/28  brain]
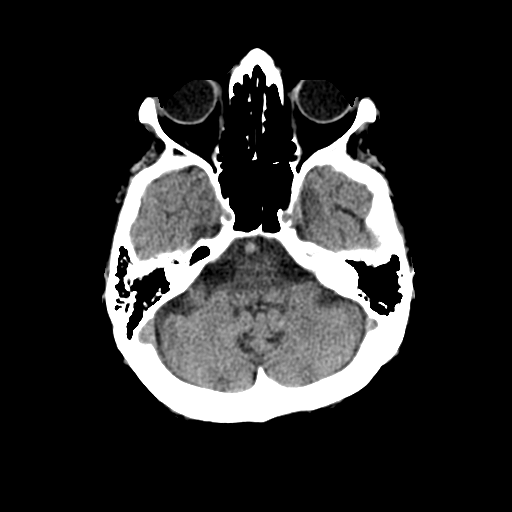
[im 9/28  brain]
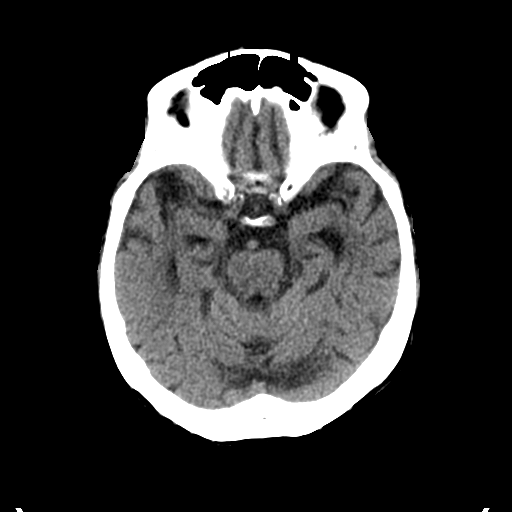
[im 11/28  brain]
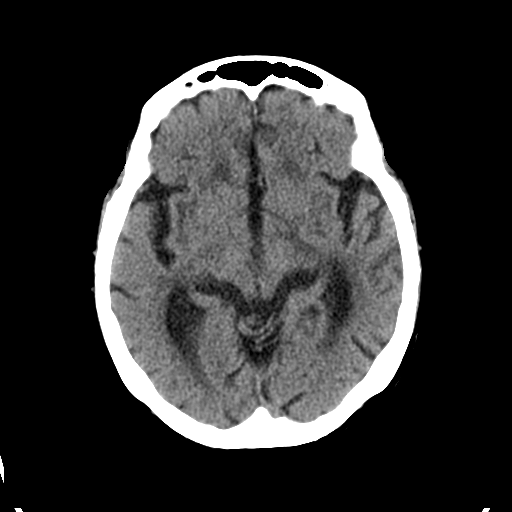
[im 14/28  brain]
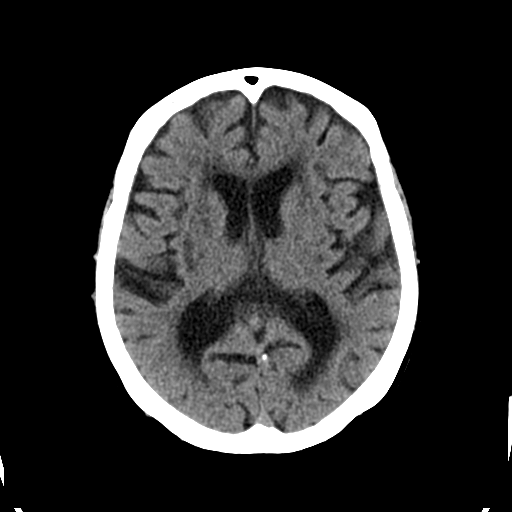
[im 14/28  bone]
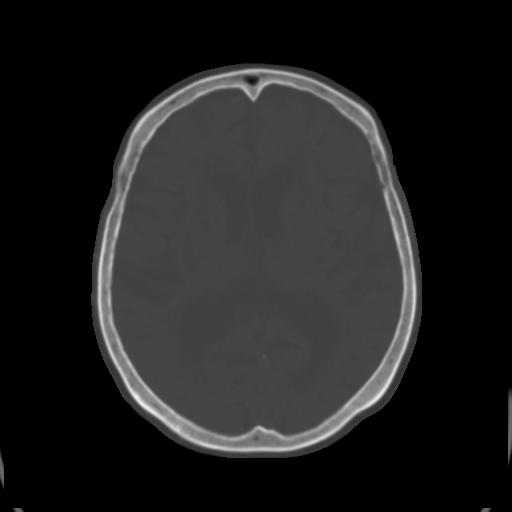
[im 17/28  brain]
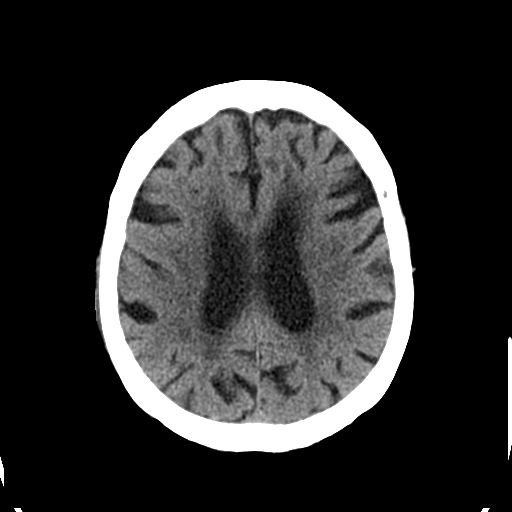
[im 19/28  brain]
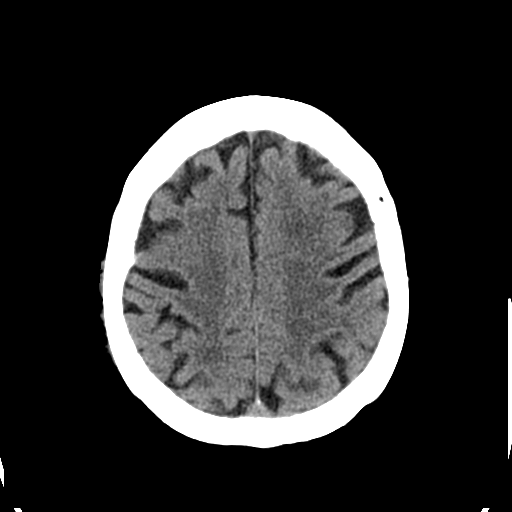
[im 22/28  brain]
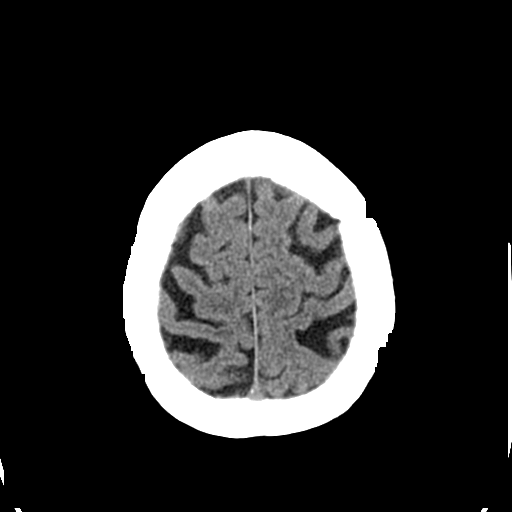
[im 25/28  brain]
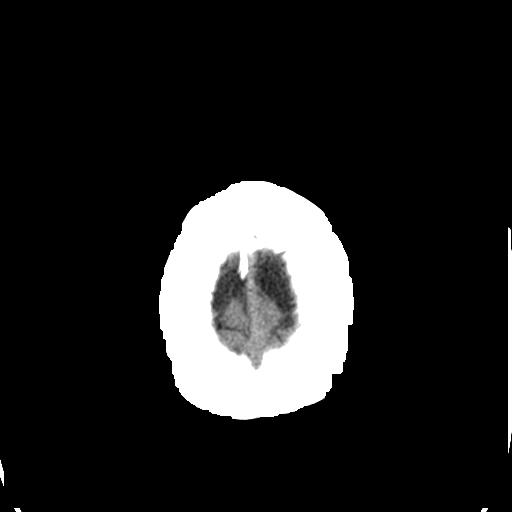
[im 25/28  bone]
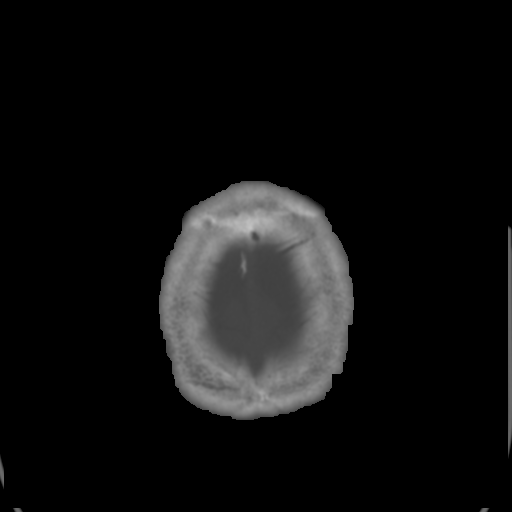

[Series 3: bone windows · axial · 0.43mm/px · z∈[-291,-201]mm · 7 of 46 slices shown]
[im 6/46  bone]
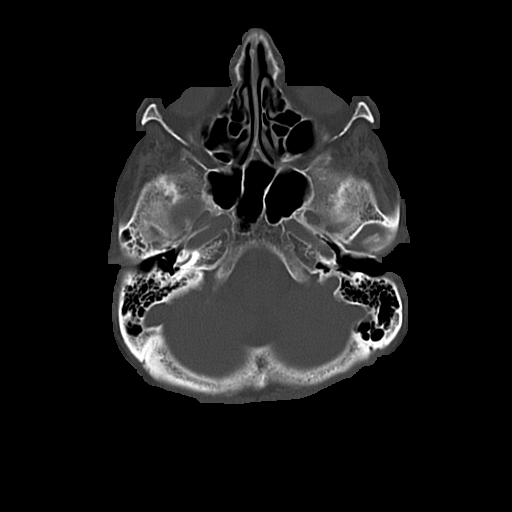
[im 11/46  bone]
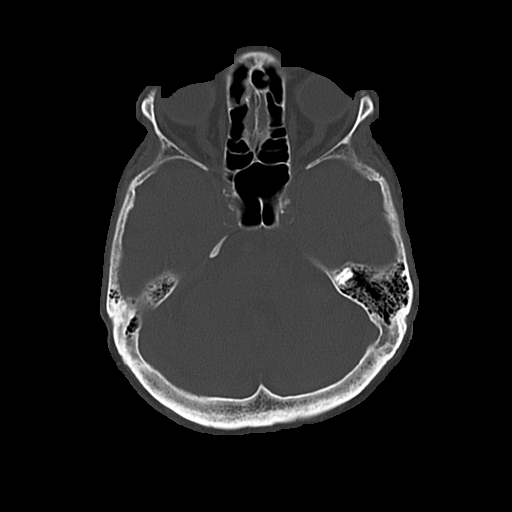
[im 16/46  bone]
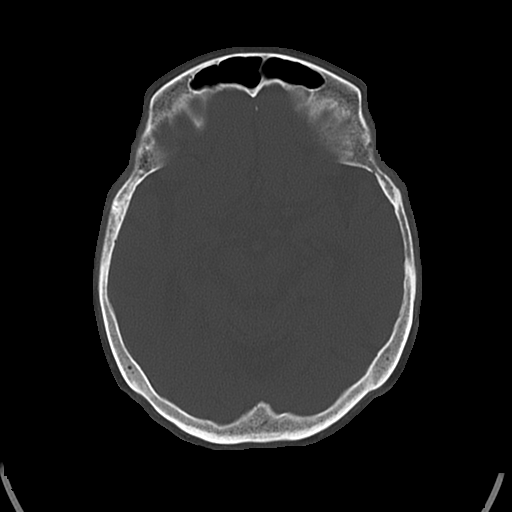
[im 21/46  bone]
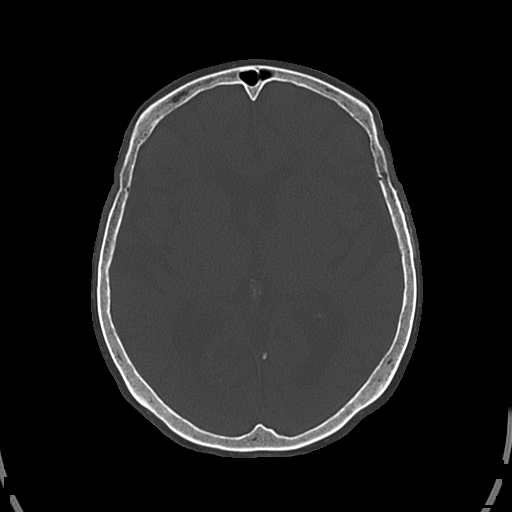
[im 26/46  bone]
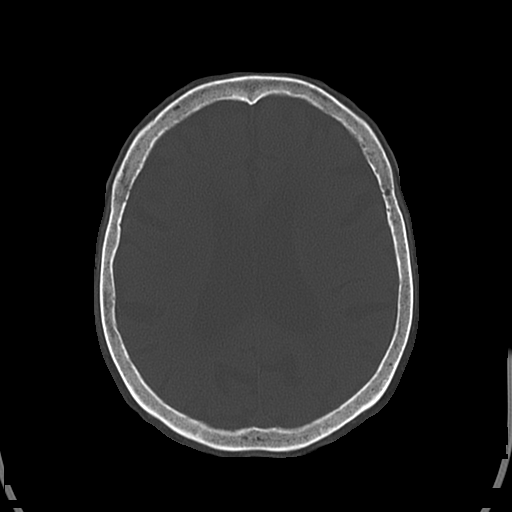
[im 31/46  bone]
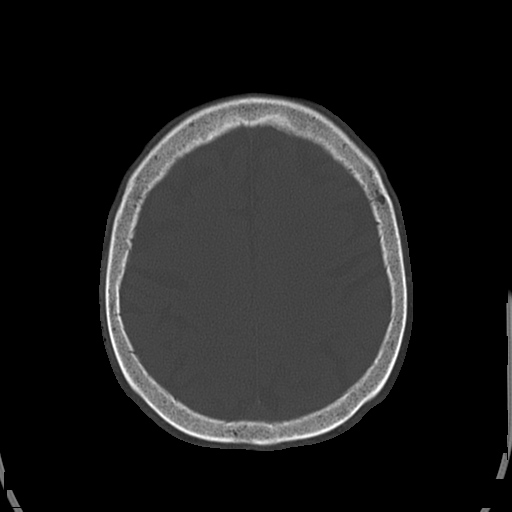
[im 36/46  bone]
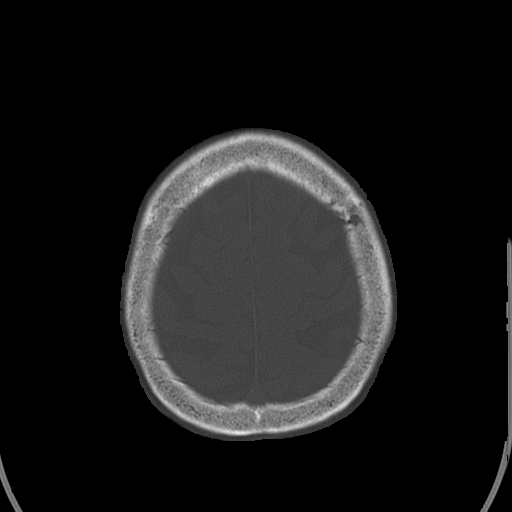

[16 of 30 positions shown; findings below may reference images not displayed]

FINDINGS: There is no evidence for acute infarction, intracranial
hemorrhage, mass lesion, hydrocephalus, or extra-axial fluid.
Moderate atrophy.  Extensive chronic microvascular ischemic change.
Bilateral cataract extraction.

Moderate sized right temporoparietal scalp hematoma with
subcutaneous air suggesting laceration.  There is no visible
underlying skull fracture.  There is no adjacent subdural hematoma.
No contrecoup injury is identified.

There is advanced vascular calcification.  The sinuses and mastoids
are clear.  Compared with priors, a similar appearance is noted.
IMPRESSION: Right temporoparietal scalp laceration and hematoma.  No skull
fracture or intracranial hemorrhage.

Atrophy and chronic microvascular ischemic change appears stable.

## 2015-10-08 IMAGING — CR DG ABDOMEN 2V
2 series · 2 of 2 positions shown · non-contrast
Comparison: None.

CLINICAL DATA: Nausea, vomiting, diarrhea

EXAM:
ABDOMEN - 2 VIEW

[w abdomen decub *]
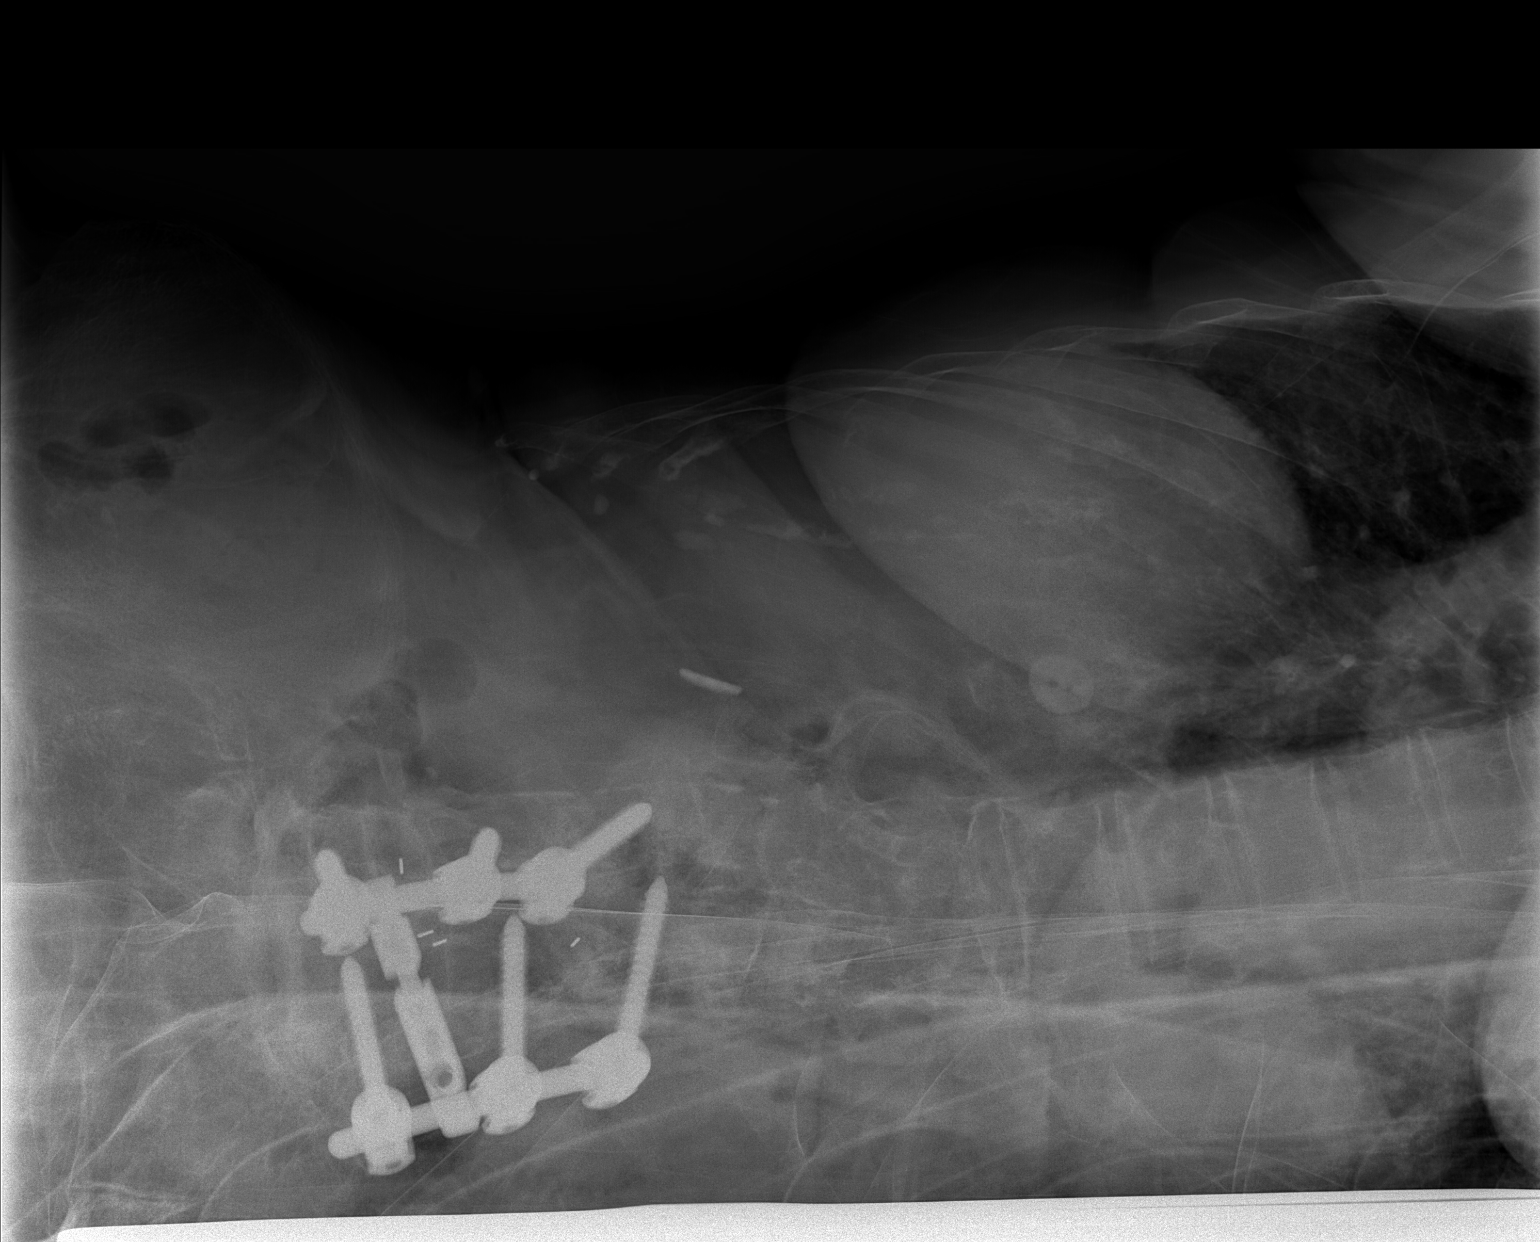

[view not recorded]
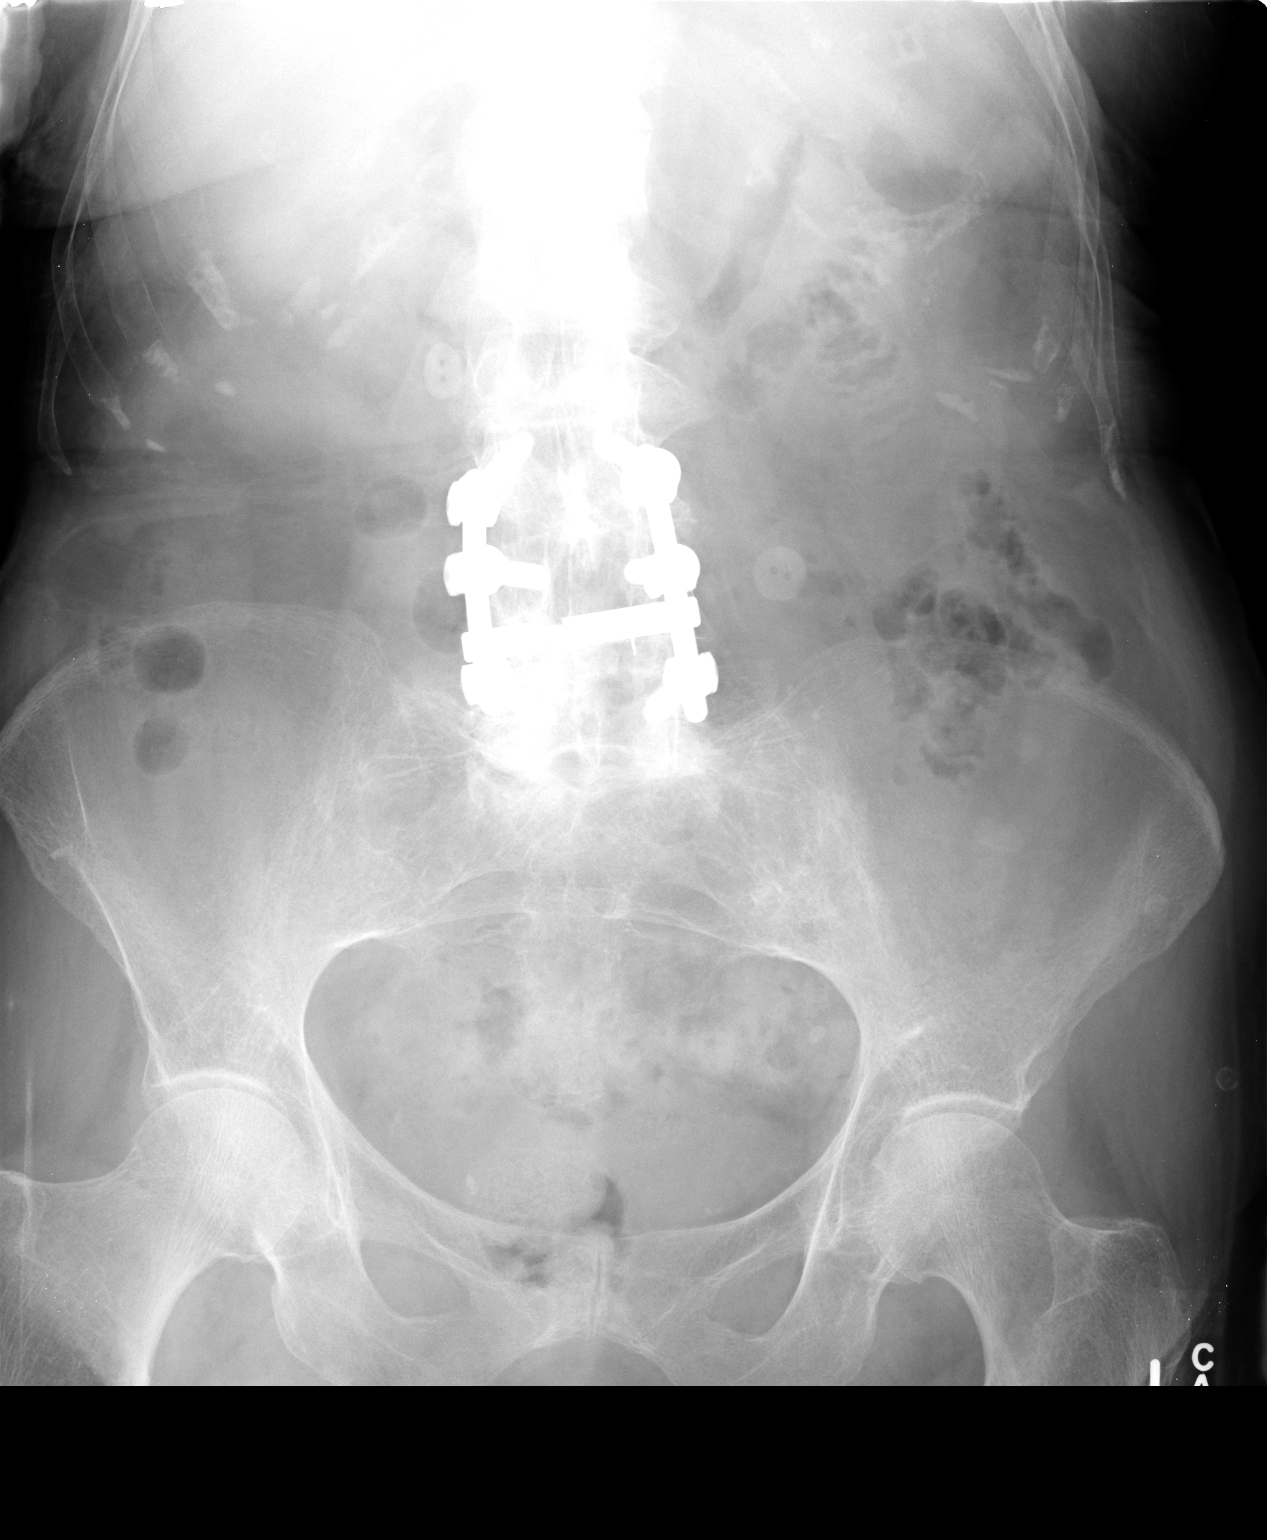

[2 of 2 positions shown; findings below may reference images not displayed]

FINDINGS: The bowel gas pattern is normal. There is no evidence of free air.
The pattern of mottled lucency over the distal colon is consistent
with colonic diverticula. There is no definitive urolithiasis. The
sacroiliac joints are fused. There is been L3-4 and L4-5 discectomy
with rod and pedicle screw fixation. No evidence of hardware
compromise.
IMPRESSION: No evidence of bowel obstruction or perforation.
